# Patient Record
Sex: Female | Born: 1995 | Race: Black or African American | Hispanic: No | Marital: Single | Smoking: Never smoker
Health system: Southern US, Community
[De-identification: ages and names within clinical notes are randomized; demographics above are authoritative.]

## PROBLEM LIST (undated history)

## (undated) ENCOUNTER — Inpatient Hospital Stay (HOSPITAL_COMMUNITY): Payer: BC Managed Care – PPO

## (undated) DIAGNOSIS — M359 Systemic involvement of connective tissue, unspecified: Secondary | ICD-10-CM

## (undated) DIAGNOSIS — E66813 Obesity, class 3: Secondary | ICD-10-CM

## (undated) DIAGNOSIS — E119 Type 2 diabetes mellitus without complications: Secondary | ICD-10-CM

## (undated) DIAGNOSIS — N7093 Salpingitis and oophoritis, unspecified: Secondary | ICD-10-CM

## (undated) DIAGNOSIS — N719 Inflammatory disease of uterus, unspecified: Secondary | ICD-10-CM

## (undated) DIAGNOSIS — G4730 Sleep apnea, unspecified: Secondary | ICD-10-CM

## (undated) DIAGNOSIS — N809 Endometriosis, unspecified: Secondary | ICD-10-CM

## (undated) DIAGNOSIS — J45909 Unspecified asthma, uncomplicated: Secondary | ICD-10-CM

## (undated) DIAGNOSIS — M797 Fibromyalgia: Secondary | ICD-10-CM

## (undated) HISTORY — DX: Type 2 diabetes mellitus without complications: E11.9

## (undated) HISTORY — DX: Salpingitis and oophoritis, unspecified: N70.93

## (undated) HISTORY — PX: CHOLECYSTECTOMY: SHX55

## (undated) HISTORY — DX: Obesity, class 3: E66.813

## (undated) HISTORY — DX: Inflammatory disease of uterus, unspecified: N71.9

## (undated) HISTORY — PX: TONSILLECTOMY AND ADENOIDECTOMY: SHX28

## (undated) HISTORY — DX: Morbid (severe) obesity due to excess calories: E66.01

## (undated) HISTORY — PX: DILATATION AND CURETTAGE/HYSTEROSCOPY WITH MINERVA: SHX6851

## (undated) HISTORY — PX: TONSILLECTOMY: SUR1361

---
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
## 2017-07-30 ENCOUNTER — Ambulatory Visit
Admission: EM | Admit: 2017-07-30 | Discharge: 2017-07-30 | Disposition: A | Discharge status: Home / Self Care | Payer: Medicaid Other | Attending: Family Medicine | Admitting: Family Medicine

## 2017-07-30 ENCOUNTER — Encounter: Payer: Self-pay | Admitting: *Deleted

## 2017-07-30 DIAGNOSIS — Z833 Family history of diabetes mellitus: Secondary | ICD-10-CM | POA: Insufficient documentation

## 2017-07-30 DIAGNOSIS — Z8249 Family history of ischemic heart disease and other diseases of the circulatory system: Secondary | ICD-10-CM | POA: Diagnosis not present

## 2017-07-30 DIAGNOSIS — N938 Other specified abnormal uterine and vaginal bleeding: Secondary | ICD-10-CM

## 2017-07-30 DIAGNOSIS — R1032 Left lower quadrant pain: Secondary | ICD-10-CM | POA: Diagnosis not present

## 2017-07-30 DIAGNOSIS — R1031 Right lower quadrant pain: Secondary | ICD-10-CM | POA: Diagnosis not present

## 2017-07-30 DIAGNOSIS — R102 Pelvic and perineal pain: Secondary | ICD-10-CM | POA: Insufficient documentation

## 2017-07-30 LAB — URINALYSIS, COMPLETE (UACMP) WITH MICROSCOPIC
Bilirubin Urine: NEGATIVE
Glucose, UA: NEGATIVE mg/dL
Ketones, ur: NEGATIVE mg/dL
Leukocytes, UA: NEGATIVE
Nitrite: NEGATIVE
Protein, ur: NEGATIVE mg/dL
Specific Gravity, Urine: 1.025 (ref 1.005–1.030)
pH: 6.5 (ref 5.0–8.0)

## 2017-07-30 LAB — PREGNANCY, URINE: Preg Test, Ur: NEGATIVE

## 2017-07-30 MED ORDER — KETOROLAC TROMETHAMINE 10 MG PO TABS: 10.0000 mg | ORAL_TABLET | Freq: Three times a day (TID) | ORAL | 0 refills | Status: DC | PRN

## 2017-07-30 MED ORDER — ONDANSETRON 8 MG PO TBDP
8.0000 mg | ORAL_TABLET | Freq: Once | ORAL | Status: AC
  Administered 2017-07-30: 8 mg via ORAL

## 2017-07-30 MED ORDER — KETOROLAC TROMETHAMINE 60 MG/2ML IM SOLN
60.0000 mg | Freq: Once | INTRAMUSCULAR | Status: AC
  Administered 2017-07-30: 60 mg via INTRAMUSCULAR

## 2017-07-30 NOTE — ED Triage Notes (Signed)
Patient started having lower abdominal pain 2 weeks ago that started hurting worse 3 days ago. Patient was checked for UTI 2 weeks ago.

## 2017-07-30 NOTE — Discharge Instructions (Signed)
Follow-up with Gynecologist

## 2017-07-30 NOTE — ED Provider Notes (Signed)
[facility]    CSN: [id number] Arrival date & time: 07/30/17  1510     History   Chief Complaint Chief Complaint  Patient presents with  . Abdominal Pain    HPI Jade Lopez is a 21 y.o. female.   The history is provided by the patient.  Abdominal Pain  Pain location:  RLQ and LLQ Pain quality: cramping   Pain radiates to:  Does not radiate Pain severity:  Moderate Onset quality:  Gradual Duration:  10 days Timing:  Constant Progression:  Worsening Chronicity:  Recurrent Context: not alcohol use, not awakening from sleep, not diet changes, not eating, not laxative use, not medication withdrawal, not previous surgeries, not recent illness, not recent sexual activity, not recent travel, not retching, not sick contacts, not suspicious food intake and not trauma   Relieved by:  None tried Ineffective treatments:  None tried Associated symptoms: vaginal bleeding   Associated symptoms: no anorexia, no belching, no chest pain, no chills, no constipation, no cough, no diarrhea, no dysuria, no fatigue, no fever, no flatus, no hematemesis, no hematochezia, no hematuria, no melena, no nausea, no shortness of breath, no sore throat, no vaginal discharge and no vomiting   Risk factors: obesity   Risk factors: no alcohol abuse, no aspirin use, not elderly, has not had multiple surgeries, no NSAID use, not pregnant and no recent hospitalization   Risk factors comment:  History of fibroids   History reviewed. No pertinent past medical history.  There are no active problems to display for this patient.   Past Surgical History:  Procedure Laterality Date  . TONSILLECTOMY AND ADENOIDECTOMY      OB History    No data available       Home Medications    Prior to Admission medications   Medication Sig Start Date End Date Taking? Authorizing Provider  ketorolac (TORADOL) 10 MG tablet Take 1 tablet (10 mg total) by mouth every 8 (eight) hours as needed. 07/30/17    Payton Mccallum, MD    Family History Family History  Problem Relation Age of Onset  . Diabetes Mother   . Hypertension Mother   . Diabetes Father   . Hypertension Father     Social History Social History  Substance Use Topics  . Smoking status: Never Smoker  . Smokeless tobacco: Never Used  . Alcohol use No     Allergies   Other   Review of Systems Review of Systems  Constitutional: Negative for chills, fatigue and fever.  HENT: Negative for sore throat.   Respiratory: Negative for cough and shortness of breath.   Cardiovascular: Negative for chest pain.  Gastrointestinal: Positive for abdominal pain. Negative for anorexia, constipation, diarrhea, flatus, hematemesis, hematochezia, melena, nausea and vomiting.  Genitourinary: Positive for vaginal bleeding. Negative for dysuria, hematuria and vaginal discharge.     Physical Exam Triage Vital Signs ED Triage Vitals  Enc Vitals Group     BP 07/30/17 1543 109/81     Pulse Rate 07/30/17 1543 (!) 117     Resp 07/30/17 1543 16     Temp 07/30/17 1543 98.8 F (37.1 C)     Temp Source 07/30/17 1543 Oral     SpO2 07/30/17 1543 100 %     Weight 07/30/17 1544 (!) 365 lb (165.6 kg)     Height 07/30/17 1544  (1.626 m)     Head Circumference --      Peak Flow --  Pain Score 07/30/17 1544 9     Pain Loc --      Pain Edu? --      Excl. in GC? --    No data found.   Updated Vital Signs BP 109/81 (BP Location: Left Arm)   Pulse (!) 117   Temp 98.8 F (37.1 C) (Oral)   Resp 16   Ht  (1.626 m)   Wt (!) 365 lb (165.6 kg)   LMP 07/30/2017   SpO2 100%   BMI 62.65 kg/m   Visual Acuity Right Eye Distance:   Left Eye Distance:   Bilateral Distance:    Right Eye Near:   Left Eye Near:    Bilateral Near:     Physical Exam  Constitutional: She appears well-developed and well-nourished. No distress.  Abdominal: Soft. Bowel sounds are normal. She exhibits no distension and no mass. There is tenderness  (mild, lower bilateral). There is no rebound and no guarding.  Skin: She is not diaphoretic.  Nursing note and vitals reviewed.    UC Treatments / Results  Labs (all labs ordered are listed, but only abnormal results are displayed) Labs Reviewed  URINALYSIS, COMPLETE (UACMP) WITH MICROSCOPIC - Abnormal; Notable for the following:       Result Value   APPearance CLOUDY (*)    Hgb urine dipstick MODERATE (*)    Squamous Epithelial / LPF 6-30 (*)    Bacteria, UA FEW (*)    All other components within normal limits  URINE CULTURE  PREGNANCY, URINE    EKG  EKG Interpretation None       Radiology No results found.  Procedures Procedures (including critical care time)  Medications Ordered in UC Medications  ondansetron (ZOFRAN-ODT) disintegrating tablet 8 mg (8 mg Oral Given 07/30/17 1705)  ketorolac (TORADOL) injection 60 mg (60 mg Intramuscular Given 07/30/17 1706)     Initial Impression / Assessment and Plan / UC Course  I have reviewed the triage vital signs and the nursing notes.  Pertinent labs & imaging results that were available during my care of the patient were reviewed by me and considered in my medical decision making (see chart for details).       Final Clinical Impressions(s) / UC Diagnoses   Final diagnoses:  Pelvic cramping    New Prescriptions Discharge Medication List as of 07/30/2017  5:19 PM    START taking these medications   Details  ketorolac (TORADOL) 10 MG tablet Take 1 tablet (10 mg total) by mouth every 8 (eight) hours as needed., Starting Fri 07/30/2017, Normal       1. diagnosis reviewed with patient; patient given toradol  IM x1 and zofran  odt with improvement of symptoms 2. rx as per orders above; reviewed possible side effects, interactions, risks and benefits  3.Follow-up with GYN 4. Follow up prn if symptoms worsen or don't improve  Controlled Substance Prescriptions [place] Controlled Substance Registry consulted? Not  Applicable   Payton Mccallum, MD 07/30/17 2057

## 2017-08-01 LAB — URINE CULTURE: Special Requests: NORMAL

## 2018-11-09 DIAGNOSIS — I669 Occlusion and stenosis of unspecified cerebral artery: Secondary | ICD-10-CM

## 2018-11-09 HISTORY — DX: Occlusion and stenosis of unspecified cerebral artery: I66.9

## 2019-11-10 HISTORY — PX: INCISION AND DRAINAGE ABSCESS: SHX5864

## 2020-10-06 ENCOUNTER — Emergency Department (HOSPITAL_COMMUNITY): Payer: BC Managed Care – PPO

## 2020-10-06 ENCOUNTER — Encounter (HOSPITAL_COMMUNITY): Payer: Self-pay

## 2020-10-06 ENCOUNTER — Emergency Department (HOSPITAL_COMMUNITY)
Admission: EM | Admit: 2020-10-06 | Discharge: 2020-10-06 | Disposition: A | Discharge status: Home / Self Care | Payer: BC Managed Care – PPO | Attending: Emergency Medicine | Admitting: Emergency Medicine

## 2020-10-06 ENCOUNTER — Other Ambulatory Visit: Payer: Self-pay

## 2020-10-06 ENCOUNTER — Encounter (HOSPITAL_COMMUNITY): Payer: Self-pay | Admitting: Emergency Medicine

## 2020-10-06 ENCOUNTER — Ambulatory Visit (HOSPITAL_COMMUNITY)
Admission: EM | Admit: 2020-10-06 | Discharge: 2020-10-06 | Disposition: A | Discharge status: Nursing Home (Includes ICF) | Payer: BC Managed Care – PPO

## 2020-10-06 DIAGNOSIS — R0602 Shortness of breath: Secondary | ICD-10-CM | POA: Diagnosis not present

## 2020-10-06 DIAGNOSIS — R42 Dizziness and giddiness: Secondary | ICD-10-CM | POA: Insufficient documentation

## 2020-10-06 DIAGNOSIS — R0600 Dyspnea, unspecified: Secondary | ICD-10-CM | POA: Insufficient documentation

## 2020-10-06 DIAGNOSIS — R002 Palpitations: Secondary | ICD-10-CM | POA: Insufficient documentation

## 2020-10-06 DIAGNOSIS — R000 Tachycardia, unspecified: Secondary | ICD-10-CM

## 2020-10-06 DIAGNOSIS — Z20822 Contact with and (suspected) exposure to covid-19: Secondary | ICD-10-CM | POA: Diagnosis not present

## 2020-10-06 HISTORY — DX: Fibromyalgia: M79.7

## 2020-10-06 HISTORY — DX: Systemic involvement of connective tissue, unspecified: M35.9

## 2020-10-06 LAB — I-STAT BETA HCG BLOOD, ED (MC, WL, AP ONLY): I-stat hCG, quantitative: <5 m[IU]/mL (ref <5)

## 2020-10-06 LAB — COMPREHENSIVE METABOLIC PANEL
ALT: 14 U/L (ref 0–44)
AST: 14 U/L — ABNORMAL LOW (ref 15–41)
Albumin: 3.3 g/dL — ABNORMAL LOW (ref 3.5–5.0)
Alkaline Phosphatase: 79 U/L (ref 38–126)
Anion gap: 11 (ref 5–15)
BUN: 11 mg/dL (ref 6–20)
CO2: 23 mmol/L (ref 22–32)
Calcium: 9.3 mg/dL (ref 8.9–10.3)
Chloride: 105 mmol/L (ref 98–111)
Creatinine, Ser: 0.76 mg/dL (ref 0.44–1.00)
GFR, Estimated: >60 mL/min (ref >60)
Glucose, Bld: 97 mg/dL (ref 70–99)
Potassium: 4.1 mmol/L (ref 3.5–5.1)
Sodium: 139 mmol/L (ref 135–145)
Total Bilirubin: 0.6 mg/dL (ref 0.3–1.2)
Total Protein: 7.7 g/dL (ref 6.5–8.1)

## 2020-10-06 LAB — RESP PANEL BY RT-PCR (FLU A&B, COVID) ARPGX2
Influenza A by PCR: NEGATIVE
Influenza B by PCR: NEGATIVE
SARS Coronavirus 2 by RT PCR: NEGATIVE

## 2020-10-06 LAB — CBC WITH DIFFERENTIAL/PLATELET
Abs Immature Granulocytes: 0.04 10*3/uL (ref 0.00–0.07)
Basophils Absolute: 0.1 10*3/uL (ref 0.0–0.1)
Basophils Relative: 1 %
Eosinophils Absolute: 0.2 10*3/uL (ref 0.0–0.5)
Eosinophils Relative: 2 %
HCT: 45.4 % (ref 36.0–46.0)
Hemoglobin: 13.5 g/dL (ref 12.0–15.0)
Immature Granulocytes: 0 %
Lymphocytes Relative: 28 %
Lymphs Abs: 2.8 10*3/uL (ref 0.7–4.0)
MCH: 24.9 pg — ABNORMAL LOW (ref 26.0–34.0)
MCHC: 29.7 g/dL — ABNORMAL LOW (ref 30.0–36.0)
MCV: 83.8 fL (ref 80.0–100.0)
Monocytes Absolute: 0.6 10*3/uL (ref 0.1–1.0)
Monocytes Relative: 6 %
Neutro Abs: 6.3 10*3/uL (ref 1.7–7.7)
Neutrophils Relative %: 63 %
Platelets: 299 10*3/uL (ref 150–400)
RBC: 5.42 MIL/uL — ABNORMAL HIGH (ref 3.87–5.11)
RDW: 14.6 % (ref 11.5–15.5)
WBC: 10 10*3/uL (ref 4.0–10.5)
nRBC: 0 % (ref 0.0–0.2)

## 2020-10-06 MED ORDER — IOHEXOL 350 MG/ML SOLN
75.0000 mL | Freq: Once | INTRAVENOUS | Status: AC | PRN
  Administered 2020-10-06: 75 mL via INTRAVENOUS

## 2020-10-06 NOTE — ED Notes (Signed)
[facility] notified of need for pt transport. 

## 2020-10-06 NOTE — ED Notes (Signed)
Pt returned from ct scan. Pt c/o headache, asked for ice pack for same. Ice pack applied.

## 2020-10-06 NOTE — ED Provider Notes (Signed)
[facility]    CSN: [id number] Arrival date & time: 10/06/20  1406      History   Chief Complaint Chief Complaint  Patient presents with  . Shortness of Breath    HPI Jade Lopez is a 24 y.o. female.   HPI Patient presents today with over a week of worsening shortness of breath and right anterior sharp stabbing chest pain.  Patient reports that she was diagnosed with pneumonia and a pleural effusion at a hospital in [facility] approximately 7 weeks ago.  She reports that she has had shortness of breath since that time however over the last week her shortness of breath and stabbing chest pain have worsened.  Since being here in urgent care she reports increasingly feeling dizzy she is tachycardic and is having increased work of breathing.  Her sats dropped with ambulation into the low 90s and rebounded to 92% after sitting.  Patient has a medical history significant for systemic lupus erythematous.  Past Medical History:  Diagnosis Date  . Autoimmune disease (HCC)   . Fibromyalgia     There are no problems to display for this patient.   Past Surgical History:  Procedure Laterality Date  . CHOLECYSTECTOMY    . TONSILLECTOMY    . TONSILLECTOMY AND ADENOIDECTOMY      OB History   No obstetric history on file.      Home Medications    Prior to Admission medications   Medication Sig Start Date End Date Taking? Authorizing Provider  albuterol (ACCUNEB) 1.25 MG/3ML nebulizer solution Take 1 ampule by nebulization every 6 (six) hours as needed for wheezing.   Yes [provider]  albuterol (VENTOLIN HFA) 108 (90 Base) MCG/ACT inhaler Inhale into the lungs every 6 (six) hours as needed for wheezing or shortness of breath.   Yes [provider]  predniSONE (DELTASONE) 20 MG tablet Take 20 mg by mouth 2 (two) times daily with a meal.   Yes [provider]  ketorolac (TORADOL) 10 MG tablet Take 1 tablet (10 mg total) by mouth  every 8 (eight) hours as needed. 07/30/17   Payton Mccallum, MD    Family History Family History  Problem Relation Age of Onset  . Diabetes Mother   . Hypertension Mother   . Diabetes Father   . Hypertension Father     Social History Social History   Tobacco Use  . Smoking status: Never Smoker  . Smokeless tobacco: Never Used  Substance Use Topics  . Alcohol use: Yes  . Drug use: No     Allergies   Other and Clindamycin/lincomycin   Review of Systems Review of Systems Pertinent negatives listed in HPI  Physical Exam Triage Vital Signs ED Triage Vitals  Enc Vitals Group     BP 10/06/20 1458 123/86     Pulse Rate 10/06/20 1458 (!) 115     Resp 10/06/20 1458 (!) 22     Temp 10/06/20 1458 98.3 F (36.8 C)     Temp Source 10/06/20 1458 Oral     SpO2 10/06/20 1458 100 %     Weight --      Height --      Head Circumference --      Peak Flow --      Pain Score 10/06/20 1450 9     Pain Loc --      Pain Edu? --      Excl. in GC? --    No  data found.  Updated Vital Signs BP 123/86 (BP Location: Right Arm) Comment (BP Location): regular cuff, forearm  Pulse (!) 130   Temp 98.3 F (36.8 C) (Oral)   Resp (!) 22   SpO2 92%   Visual Acuity Right Eye Distance:   Left Eye Distance:   Bilateral Distance:    Right Eye Near:   Left Eye Near:    Bilateral Near:     Physical Exam Constitutional:      General: She is in acute distress.     Appearance: She is obese.  Eyes:     Pupils: Pupils are equal, round, and reactive to light.  Cardiovascular:     Rate and Rhythm: Tachycardia present.  Chest:     Chest wall: Tenderness present.  Musculoskeletal:     Cervical back: Normal range of motion and neck supple.  Skin:    General: Skin is warm and dry.     Capillary Refill: Capillary refill takes less than 2 seconds.  Neurological:     General: No focal deficit present.  Psychiatric:        Mood and Affect: Mood normal.      UC Treatments / Results    Labs (all labs ordered are listed, but only abnormal results are displayed) Labs Reviewed - No data to display  EKG   Radiology No results found.  Procedures Procedures (including critical care time)  Medications Ordered in UC Medications - No data to display  Initial Impression / Assessment and Plan / UC Course  I have reviewed the triage vital signs and the nursing notes.  Pertinent labs & imaging results that were available during my care of the patient were reviewed by me and considered in my medical decision making (see chart for details).     Patient diagnosed with pneumonia at [facility] over 7 weeks ago and reports she was also told that she has a right effusion.  She reports gradually worsening shortness of breath over the last week and she is tachycardic and has had sats as low as 92% with ambulation since being here.  Suspect patient has a PE given underlying chronic condition of SLE and recent diagnosis of effusion.   Final Clinical Impressions(s) / UC Diagnoses   Final diagnoses:  SOB (shortness of breath)  Tachycardia     Discharge Instructions     Suspect patient has a PE given recent diagnosis of pneumonia 7 weeks ago and a diagnosis of a pleural effusion about 4 weeks ago per patient and lupus disease. Concern for possible pulmonary embolism. Go to [facility], ER for further work-up and evaluation.     ED Prescriptions    None     PDMP not reviewed this encounter.   Bing Neighbors, FNP 10/06/20 1623

## 2020-10-06 NOTE — Discharge Instructions (Addendum)
Please follow-up with your primary care provider regarding today's encounter.  I would also like you to get established with center at your [facility] in [place], [place].  You need to be having ongoing evaluation and management for your acute on chronic dyspnea (shortness of breath).  I have referred you to Dr. Algie Coffer, cardiologist here in [place], [place].  I believe that you would benefit from cardiac work-up given your reported symptoms.  However, if this is too far of a drive, I highly encourage you to get established with a cardiologist at a location that is more convenient.  The CT of your chest was mildly limited due to artifact, however the visualized portions appear clear.  While your work-up today was largely reassuring, please return to the ED or seek immediate medical attention should you develop any new or worsening symptoms.

## 2020-10-06 NOTE — Discharge Instructions (Addendum)
Suspect patient has a PE given recent diagnosis of pneumonia 7 weeks ago and a diagnosis of a pleural effusion about 4 weeks ago per patient and lupus disease. Concern for possible pulmonary embolism. Go to [facility], ER for further work-up and evaluation.

## 2020-10-06 NOTE — ED Notes (Signed)
Several attempts made to contact ED Charge RN without success.

## 2020-10-06 NOTE — ED Notes (Addendum)
SPO2 remained 97-98% while ambulating on room air. Patient became dizzy with ambulation. HR went up to 120s-130s. Patient back in bed sinus tach at 108 noted to monitor, SpO2 98%. Green, PA made aware of same via message.

## 2020-10-06 NOTE — ED Notes (Signed)
Patient is being discharged from the Urgent Care Center and sent to the Emergency Department via [facility]. Per K. Harris, NP, patient is stable but in need of higher level of care due to probable PE. Patient is aware and verbalizes understanding of plan of care.  Vitals:   10/06/20 1458 10/06/20 1505  BP: 123/86   Pulse: (!) 115 (!) 130  Resp: (!) 22   Temp: 98.3 F (36.8 C)   SpO2: 100% 92%

## 2020-10-06 NOTE — ED Provider Notes (Signed)
[facility] EMERGENCY DEPARTMENT Provider Note   CSN: [id number] Arrival date & time: 10/06/20  1558     History Chief Complaint  Patient presents with  . Shortness of Breath    Jade Lopez is a 24 y.o. female with PMH of autoimmune disease who presents to the ED via EMS sent from urgent care for right-sided anterior stabbing chest pain and shortness of breath.  She reported increasing dizziness, palpitations, and work of breathing since her recent diagnosis of pneumonia and pleural effusion 7 weeks ago in [place], [place].  The urgent care provider told patient that she was concerned for pulmonary embolism and encouraged her to come to the ED for CTA of the chest.  On my examination, patient reports that she is from [facility] where she sees an endocrinologist.  She states that she is currently being worked up for autoimmune disease, but her diagnosis is not yet clear.  She denies any other past medical history.  She states that this year she was diagnosed with pneumonia and earlier this year she also had mild pleural effusion.  She was treated with a Z-Pak and provided an albuterol inhaler.  In the past week, she developed sudden onset anterior sharp chest pains that are worse with deep inspiration.  She states that her chest pain is intermittent.  She also states that she is feeling lightheaded with exertion and feels as though she might pass out.  She is feeling much more short of breath than her baseline.  She denies any fevers or chills, cough, congestion or sore throat, headache, constant chest pain, abdominal pain, nausea or vomiting, hemoptysis, unilateral extremity swelling or edema, history of clots or clotting disorder, or other symptoms.  She states that the person who evaluated her at [facility] was concerned for blood blot.  She is here in [place], [place] for Thanksgiving visiting her sister.  She has not been immunized for COVID-19.  HPI     Past Medical  History:  Diagnosis Date  . Autoimmune disease (HCC)   . Fibromyalgia     There are no problems to display for this patient.   Past Surgical History:  Procedure Laterality Date  . CHOLECYSTECTOMY    . TONSILLECTOMY    . TONSILLECTOMY AND ADENOIDECTOMY       OB History   No obstetric history on file.     Family History  Problem Relation Age of Onset  . Diabetes Mother   . Hypertension Mother   . Diabetes Father   . Hypertension Father     Social History   Tobacco Use  . Smoking status: Never Smoker  . Smokeless tobacco: Never Used  Substance Use Topics  . Alcohol use: Yes  . Drug use: No    Home Medications Prior to Admission medications   Medication Sig Start Date End Date Taking? Authorizing Provider  albuterol (ACCUNEB) 1.25 MG/3ML nebulizer solution Take 1 ampule by nebulization every 6 (six) hours as needed for wheezing.    [provider]  albuterol (VENTOLIN HFA) 108 (90 Base) MCG/ACT inhaler Inhale into the lungs every 6 (six) hours as needed for wheezing or shortness of breath.    [provider]  ketorolac (TORADOL) 10 MG tablet Take 1 tablet (10 mg total) by mouth every 8 (eight) hours as needed. 07/30/17   Payton Mccallum, MD  predniSONE (DELTASONE) 20 MG tablet Take 20 mg by mouth 2 (two) times daily with a meal.    [provider]    Allergies    Other and Clindamycin/lincomycin  Review of Systems   Review of Systems  All other systems reviewed and are negative.   Physical Exam Updated Vital Signs BP 123/86   Pulse (!) 102   Temp 98.3 F (36.8 C) (Oral)   Resp 18   Ht 5\' 4"  (1.626 m)   Wt (!) 170.1 kg   SpO2 100%   BMI 64.37 kg/m   Physical Exam Constitutional:      General: She is not in acute distress.    Appearance: She is obese. She is not toxic-appearing.  Cardiovascular:     Rate and Rhythm: Normal rate and regular rhythm.     Pulses: Normal pulses.     Heart sounds: Normal heart sounds.    Pulmonary:     Comments: Mildly increased work of breathing.  Tachypneic in 20s.  No obvious rales, wheezing, or other abnormal breath sounds.  CTA bilaterally.  No reproducible chest wall tenderness. Abdominal:     General: Abdomen is flat. There is no distension.     Palpations: Abdomen is soft.     Tenderness: There is no guarding.     Comments: Mild left-sided tenderness.  Soft, nondistended.  Musculoskeletal:     Cervical back: Normal range of motion.     Right lower leg: No edema.     Left lower leg: No edema.  Skin:    Capillary Refill: Capillary refill takes less than 2 seconds.  Neurological:     General: No focal deficit present.     Mental Status: She is oriented to person, place, and time.     ED Results / Procedures / Treatments   Labs (all labs ordered are listed, but only abnormal results are displayed) Labs Reviewed  CBC WITH DIFFERENTIAL/PLATELET - Abnormal; Notable for the following components:      Result Value   RBC 5.42 (*)    MCH 24.9 (*)    MCHC 29.7 (*)    All other components within normal limits  COMPREHENSIVE METABOLIC PANEL - Abnormal; Notable for the following components:   Albumin 3.3 (*)    AST 14 (*)    All other components within normal limits  RESP PANEL BY RT-PCR (FLU A&B, COVID) ARPGX2  I-STAT BETA HCG BLOOD, ED ([facility], [facility], [facility] ONLY)    EKG EKG Interpretation  Date/Time:  Sunday October 06 2020 16:06:19 EST Ventricular Rate:  109 PR Interval:    QRS Duration: 81 QT Interval:  323 QTC Calculation: 435 R Axis:   38 Text Interpretation: Sinus tachycardia No old tracing to compare Confirmed by [id number] [phone]) on 10/06/2020 4:51:35 PM   Radiology CT Angio Chest PE W and/or Wo Contrast  Result Date: 10/06/2020 CLINICAL DATA:  Pneumonia diagnosed 7 weeks ago, now with chest pain. EXAM: CT ANGIOGRAPHY CHEST WITH CONTRAST TECHNIQUE: Multidetector CT imaging of the chest was performed using the standard protocol during bolus  administration of intravenous contrast. Multiplanar CT image reconstructions and MIPs were obtained to evaluate the vascular anatomy. CONTRAST:  55mL OMNIPAQUE IOHEXOL 350 MG/ML SOLN COMPARISON:  None. FINDINGS: Cardiovascular: Satisfactory opacification of the pulmonary arteries to the segmental level. Quantum mottle artifact limits the evaluation of segmental and subsegmental branches for pulmonary embolism. Given this limitation, no evidence of pulmonary embolism. Normal heart size. No pericardial effusion. Mediastinum/Nodes: No enlarged mediastinal, hilar, or axillary lymph nodes. Thyroid gland, trachea, and esophagus demonstrate no significant findings. Lungs/Pleura: Lungs are clear. No pleural  effusion or pneumothorax. Upper Abdomen: No acute abnormality. Musculoskeletal: No chest wall abnormality. No acute or significant osseous findings. Review of the MIP images confirms the above findings. IMPRESSION: 1. Quantum mottle artifact limits the evaluation of segmental and subsegmental branches for pulmonary embolism. Given this limitation, no evidence of pulmonary embolism. 2. No acute pulmonary process. Electronically Signed   By: Romona Curls M.D.   On: 10/06/2020 18:44    Procedures Procedures (including critical care time)  Medications Ordered in ED Medications  iohexol (OMNIPAQUE) 350 MG/ML injection 75 mL (75 mLs Intravenous Contrast Given 10/06/20 1817)    ED Course  I have reviewed the triage vital signs and the nursing notes.  Pertinent labs & imaging results that were available during my care of the patient were reviewed by me and considered in my medical decision making (see chart for details).    MDM Rules/Calculators/A&P                          Patient was sent here by urgent care with concern for pulmonary embolism.  Will obtain CTA of chest given patient's acute worsening of her shortness of breath and her associated chest pain in the context of tachypnea and tachycardia.  She  was noted to be borderline hypoxic at the urgent care, but while resting in the ED she is saturating 100% on room air.  No history of clots or clotting disorder.  We will also obtain EKG and basic laboratory work-up.  Labs CBC with differential: No leukocytosis concerning for infection.  No anemia. CMP: Unremarkable. I-STAT beta-hCG: Less than 5. Respiratory panel by PCR: Negative.  Imaging I personally reviewed CTA of the chest which demonstrates quantum mottle artifact which limits the evaluation of segmental and subsegmental branches for pulmonary embolism.  However, there is no obvious PE due to segmental level.  Lungs are also appearing to be clear without any evidence of effusion, consolidation, or pneumothorax.  Normal heart size and no effusion noted.  EKG is personally reviewed and demonstrates sinus tachycardia, no prior tracings which compare.  I reviewed her prior medical encounters and she is usually between 95 to 115 bpm.  I do not believe that today her tachycardia is anything new or different from her baseline.  I discussed this with her and she agrees.  Given her reported chest discomfort, dyspnea worse with exertion, and intermittent lightheadedness, I believe that she would be an excellent candidate for outpatient cardiology work-up.  She is morbidly obese with a 64.37 kg/m BMI and obesity hypoventilation syndrome may be a contributing factor for her symptoms and mild tachypnea here in the ED.  However, I suspect that she would benefit from cardiology work-up nonetheless.    Will refer her to Dr. Algie Coffer, cardiology.  Patient requests school note as she was supposed to drive back to [place], [place] tonight.  Her O2 saturation has been greater than 98% all evening and she was able to ambulate to the bathroom independently without any significant increased work of breathing.  I discussed patient with Dr. Effie Shy who agrees with assessment and plan.  All of the evaluation and work-up  results were discussed with the patient and any family at bedside.  Patient and/or family were informed that while patient is appropriate for discharge at this time, some medical emergencies may only develop or become detectable after a period of time.  I specifically instructed patient and/or family to return to return to the ED  or seek immediate medical attention for any new or worsening symptoms.  They were provided opportunity to ask any additional questions and have none at this time.  Prior to discharge patient is feeling well, agreeable with plan for discharge home.  They have expressed understanding of verbal discharge instructions as well as return precautions and are agreeable to the plan.    Final Clinical Impression(s) / ED Diagnoses Final diagnoses:  Dyspnea, unspecified type    Rx / DC Orders ED Discharge Orders    None       Lorelee NewGreen, Fable Huisman L, PA-C 10/06/20 1932    Mancel BaleWentz, Elliott, MD 10/07/20 [phone]

## 2020-10-06 NOTE — ED Triage Notes (Signed)
7 weeks ago diagnosed with pneumonia.    6 weeks ago started feeling pain in left chest, epigastric area and left back.  Pain occurs with walking and breathing deeply.

## 2020-10-06 NOTE — ED Triage Notes (Signed)
Pt to 14 from UC via [facility]. Pt presented with shortness of breath and left chest pain. Pt states she has had a pleural effusion for past 9 months, was diagnosed with pneumonia 7 weeks ago. Pt states she started getting sharp pain in left chest and back a week ago. Pt states she noticed swelling on left side two days ago. Pt states any movement/deep breathing increases pain. Pt NAD on arrival.  EMS VS: 136/76 RR=18 02 sat= 96% on room air

## 2021-05-09 DIAGNOSIS — R079 Chest pain, unspecified: Secondary | ICD-10-CM | POA: Diagnosis not present

## 2021-05-14 DIAGNOSIS — L723 Sebaceous cyst: Secondary | ICD-10-CM | POA: Diagnosis not present

## 2021-05-15 IMAGING — CT CT ANGIO CHEST
2 of 5 series · 17 of 46 positions shown · IV contrast (omnipaque)
Comparison: None.

CLINICAL DATA: Pneumonia diagnosed 7 weeks ago, now with chest
pain.

EXAM:
CT ANGIOGRAPHY CHEST WITH CONTRAST
TECHNIQUE: Multidetector CT imaging of the chest was performed using the
standard protocol during bolus administration of intravenous
contrast. Multiplanar CT image reconstructions and MIPs were
obtained to evaluate the vascular anatomy.
CONTRAST:  55mL OMNIPAQUE IOHEXOL 350 MG/ML SOLN

[Series 7: thins · axial · 0.73mm/px · z∈[+1211,+1420]mm · 14 of 329 slices shown]
[im 15/329  lung]
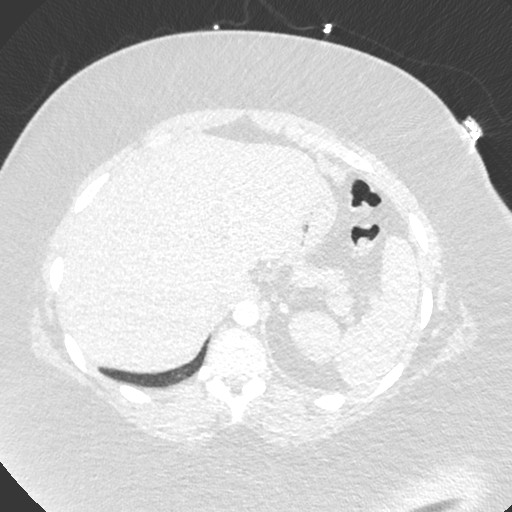
[im 43/329  soft-tissue]
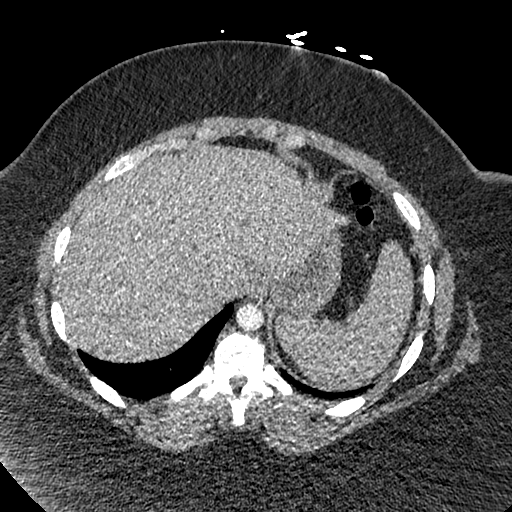
[im 58/329  lung]
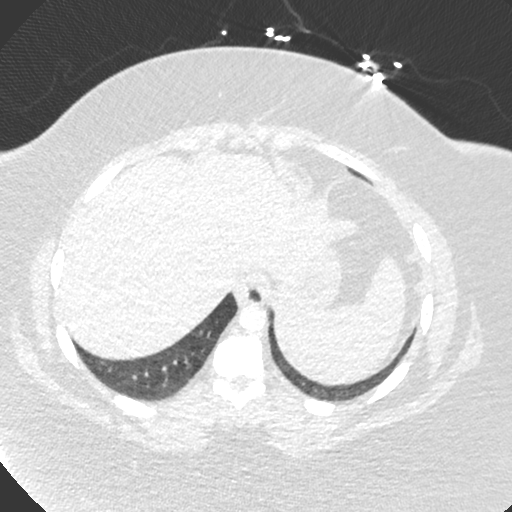
[im 86/329  soft-tissue]
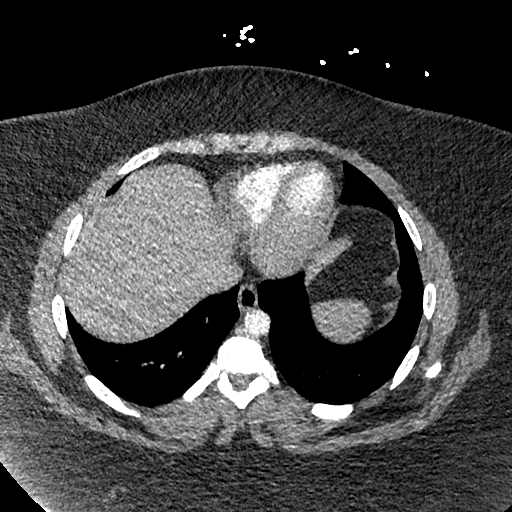
[im 115/329  lung]
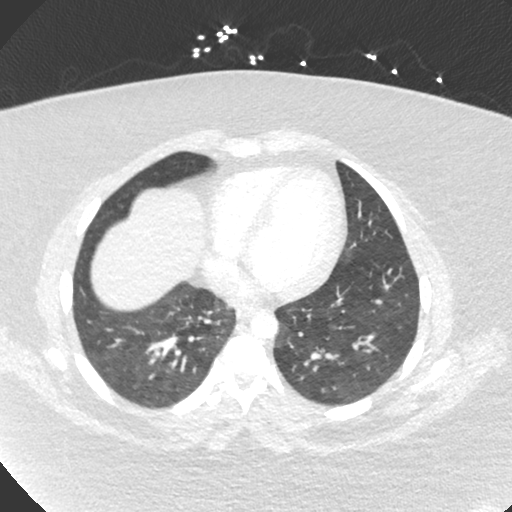
[im 129/329  soft-tissue]
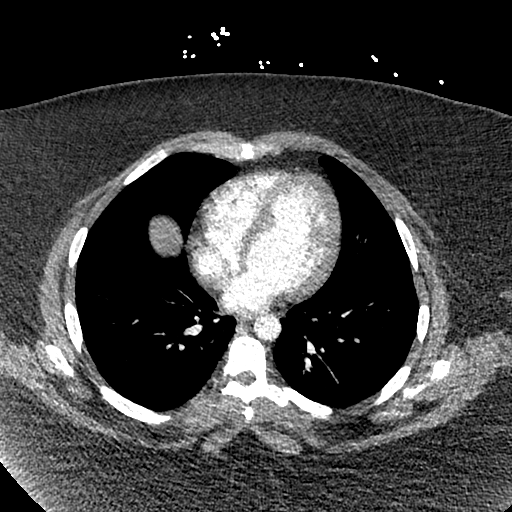
[im 157/329  lung]
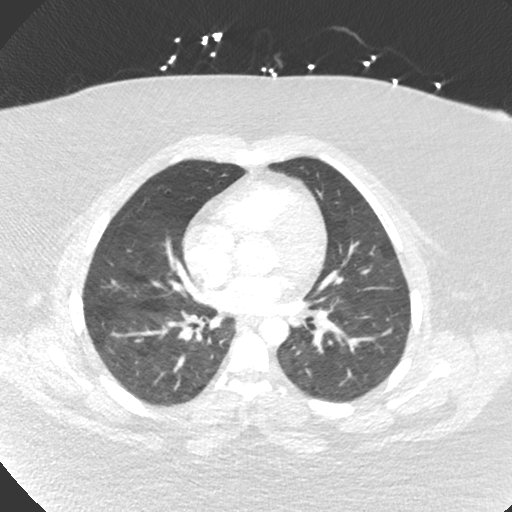
[im 172/329  soft-tissue]
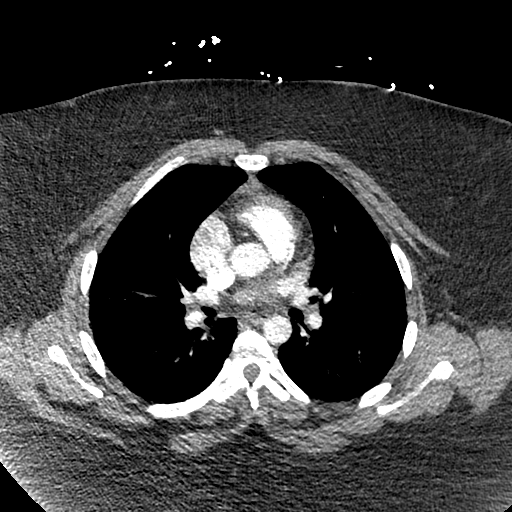
[im 200/329  lung]
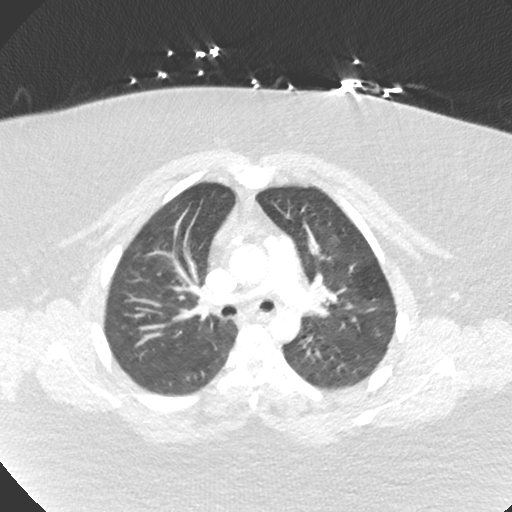
[im 214/329  soft-tissue]
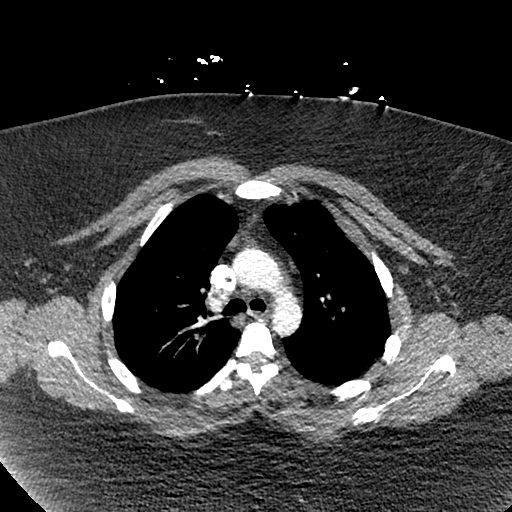
[im 243/329  lung]
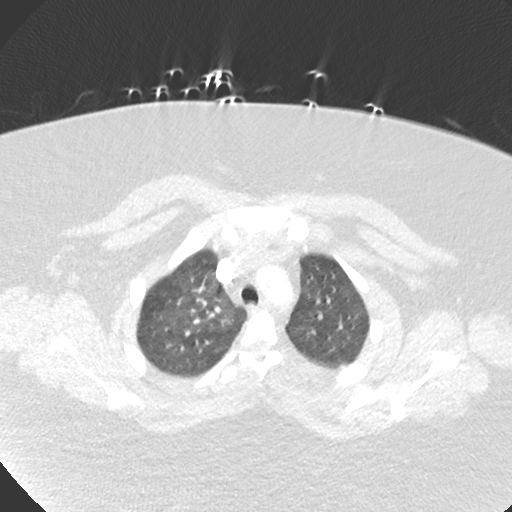
[im 271/329  soft-tissue]
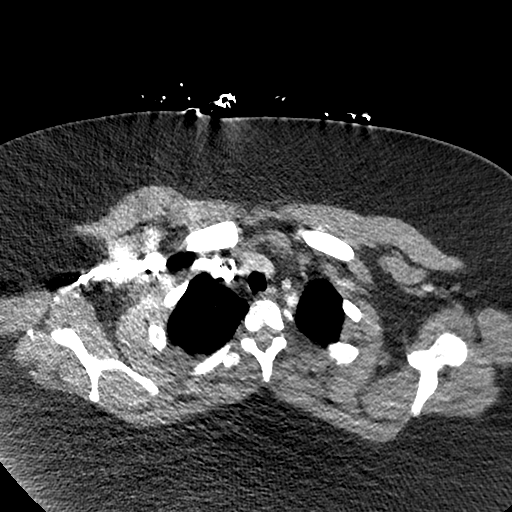
[im 286/329  lung]
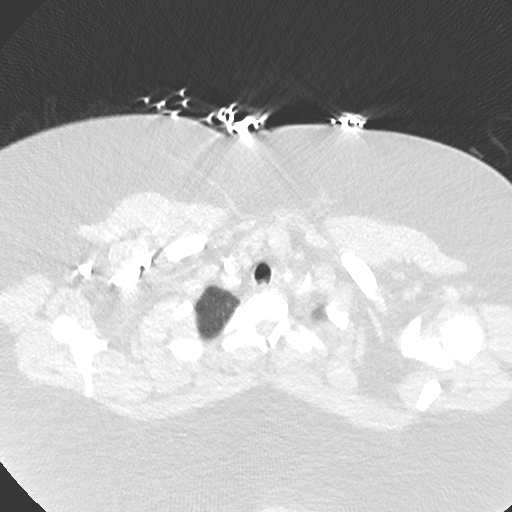
[im 314/329  soft-tissue]
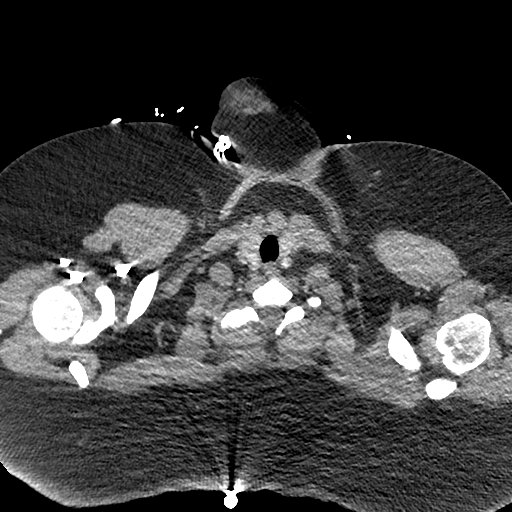

[Series 8: cor · coronal · 0.56mm/px · 3 of 205 slices shown]
[im 52/205  soft-tissue]
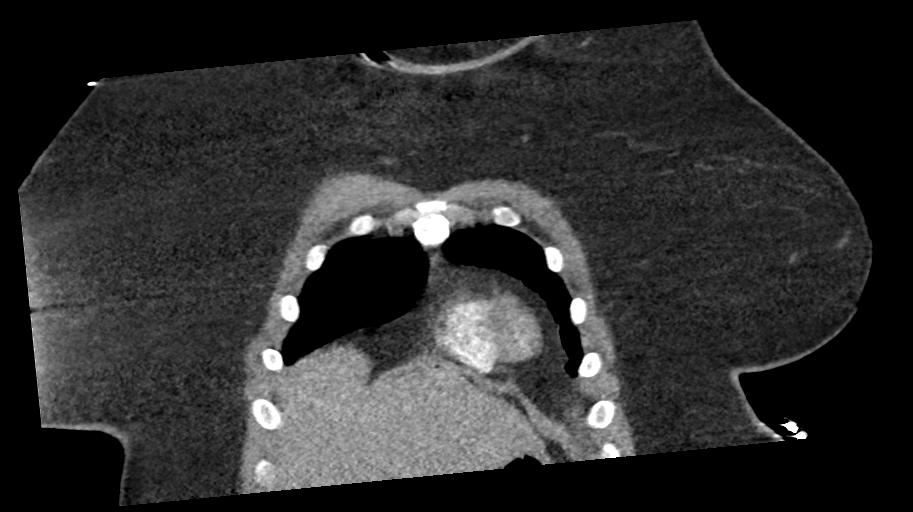
[im 103/205  soft-tissue]
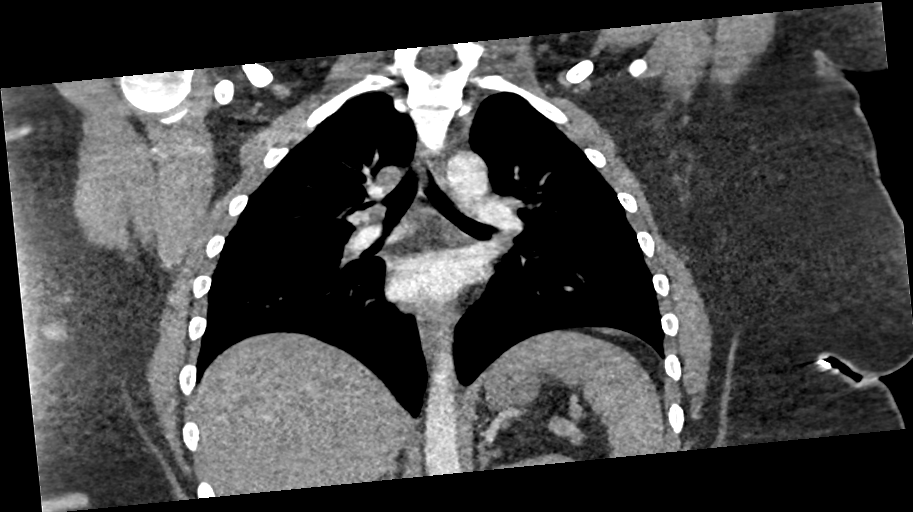
[im 154/205  soft-tissue]
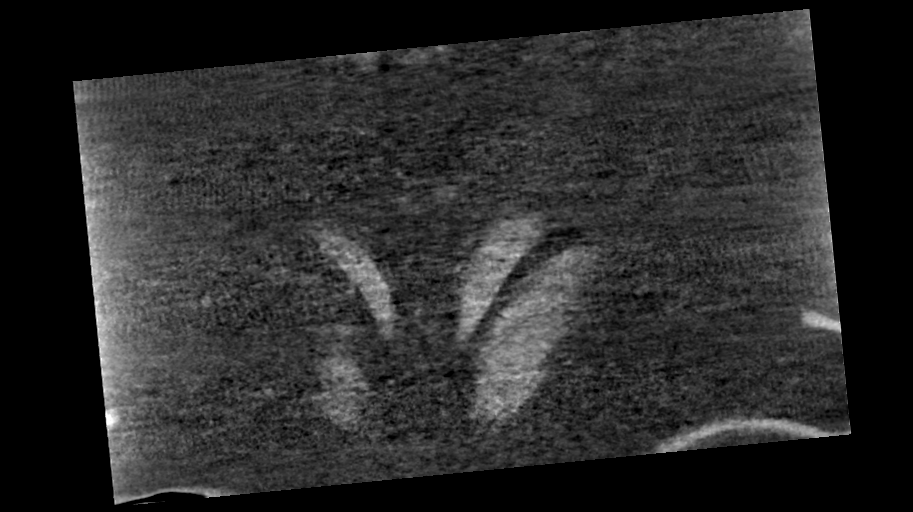

[17 of 46 positions shown; findings below may reference images not displayed]

FINDINGS: Cardiovascular: Satisfactory opacification of the pulmonary arteries
to the segmental level. Quantum mottle artifact limits the
evaluation of segmental and subsegmental branches for pulmonary
embolism. Given this limitation, no evidence of pulmonary embolism.
Normal heart size. No pericardial effusion.

Mediastinum/Nodes: No enlarged mediastinal, hilar, or axillary lymph
nodes. Thyroid gland, trachea, and esophagus demonstrate no
significant findings.

Lungs/Pleura: Lungs are clear. No pleural effusion or pneumothorax.

Upper Abdomen: No acute abnormality.

Musculoskeletal: No chest wall abnormality. No acute or significant
osseous findings.

Review of the MIP images confirms the above findings.
IMPRESSION: 1. Quantum mottle artifact limits the evaluation of segmental and
subsegmental branches for pulmonary embolism. Given this limitation,
no evidence of pulmonary embolism.
2. No acute pulmonary process.

## 2021-05-18 DIAGNOSIS — I472 Ventricular tachycardia: Secondary | ICD-10-CM | POA: Diagnosis not present

## 2021-05-18 DIAGNOSIS — I491 Atrial premature depolarization: Secondary | ICD-10-CM | POA: Diagnosis not present

## 2021-05-18 DIAGNOSIS — I493 Ventricular premature depolarization: Secondary | ICD-10-CM | POA: Diagnosis not present

## 2021-05-28 DIAGNOSIS — L02212 Cutaneous abscess of back [any part, except buttock]: Secondary | ICD-10-CM | POA: Diagnosis not present

## 2021-06-02 DIAGNOSIS — N840 Polyp of corpus uteri: Secondary | ICD-10-CM | POA: Diagnosis not present

## 2021-06-23 DIAGNOSIS — M25519 Pain in unspecified shoulder: Secondary | ICD-10-CM | POA: Diagnosis not present

## 2021-06-23 DIAGNOSIS — R200 Anesthesia of skin: Secondary | ICD-10-CM | POA: Diagnosis not present

## 2021-06-23 DIAGNOSIS — M549 Dorsalgia, unspecified: Secondary | ICD-10-CM | POA: Diagnosis not present

## 2021-06-23 DIAGNOSIS — R0602 Shortness of breath: Secondary | ICD-10-CM | POA: Diagnosis not present

## 2021-06-23 DIAGNOSIS — R079 Chest pain, unspecified: Secondary | ICD-10-CM | POA: Diagnosis not present

## 2021-06-23 DIAGNOSIS — R0789 Other chest pain: Secondary | ICD-10-CM | POA: Diagnosis not present

## 2021-06-23 DIAGNOSIS — E119 Type 2 diabetes mellitus without complications: Secondary | ICD-10-CM | POA: Diagnosis not present

## 2021-06-30 DIAGNOSIS — M25562 Pain in left knee: Secondary | ICD-10-CM | POA: Diagnosis not present

## 2021-07-03 DIAGNOSIS — M25561 Pain in right knee: Secondary | ICD-10-CM | POA: Diagnosis not present

## 2021-07-03 DIAGNOSIS — M25562 Pain in left knee: Secondary | ICD-10-CM | POA: Diagnosis not present

## 2021-07-15 DIAGNOSIS — M7989 Other specified soft tissue disorders: Secondary | ICD-10-CM | POA: Diagnosis not present

## 2021-07-15 DIAGNOSIS — M79605 Pain in left leg: Secondary | ICD-10-CM | POA: Diagnosis not present

## 2021-07-17 DIAGNOSIS — M549 Dorsalgia, unspecified: Secondary | ICD-10-CM | POA: Diagnosis not present

## 2021-07-17 DIAGNOSIS — G8929 Other chronic pain: Secondary | ICD-10-CM | POA: Diagnosis not present

## 2021-07-17 DIAGNOSIS — M546 Pain in thoracic spine: Secondary | ICD-10-CM | POA: Diagnosis not present

## 2021-07-17 DIAGNOSIS — X58XXXA Exposure to other specified factors, initial encounter: Secondary | ICD-10-CM | POA: Diagnosis not present

## 2021-07-17 DIAGNOSIS — S299XXA Unspecified injury of thorax, initial encounter: Secondary | ICD-10-CM | POA: Diagnosis not present

## 2021-07-17 DIAGNOSIS — S22080A Wedge compression fracture of T11-T12 vertebra, initial encounter for closed fracture: Secondary | ICD-10-CM | POA: Diagnosis not present

## 2021-07-17 DIAGNOSIS — M40294 Other kyphosis, thoracic region: Secondary | ICD-10-CM | POA: Diagnosis not present

## 2021-07-21 DIAGNOSIS — M546 Pain in thoracic spine: Secondary | ICD-10-CM | POA: Diagnosis not present

## 2021-07-21 DIAGNOSIS — M5459 Other low back pain: Secondary | ICD-10-CM | POA: Diagnosis not present

## 2021-07-21 DIAGNOSIS — R000 Tachycardia, unspecified: Secondary | ICD-10-CM | POA: Diagnosis not present

## 2021-07-21 DIAGNOSIS — R0682 Tachypnea, not elsewhere classified: Secondary | ICD-10-CM | POA: Diagnosis not present

## 2021-07-21 DIAGNOSIS — R079 Chest pain, unspecified: Secondary | ICD-10-CM | POA: Diagnosis not present

## 2021-07-21 DIAGNOSIS — M62838 Other muscle spasm: Secondary | ICD-10-CM | POA: Diagnosis not present

## 2021-07-21 DIAGNOSIS — S299XXA Unspecified injury of thorax, initial encounter: Secondary | ICD-10-CM | POA: Diagnosis not present

## 2021-07-21 DIAGNOSIS — S3992XA Unspecified injury of lower back, initial encounter: Secondary | ICD-10-CM | POA: Diagnosis not present

## 2021-07-21 DIAGNOSIS — J4520 Mild intermittent asthma, uncomplicated: Secondary | ICD-10-CM | POA: Diagnosis not present

## 2021-07-21 DIAGNOSIS — Z6841 Body Mass Index (BMI) 40.0 and over, adult: Secondary | ICD-10-CM | POA: Diagnosis not present

## 2021-07-25 DIAGNOSIS — M25561 Pain in right knee: Secondary | ICD-10-CM | POA: Diagnosis not present

## 2021-07-28 DIAGNOSIS — T148XXA Other injury of unspecified body region, initial encounter: Secondary | ICD-10-CM | POA: Diagnosis not present

## 2021-07-28 DIAGNOSIS — M546 Pain in thoracic spine: Secondary | ICD-10-CM | POA: Diagnosis not present

## 2021-08-05 DIAGNOSIS — L03818 Cellulitis of other sites: Secondary | ICD-10-CM | POA: Diagnosis not present

## 2021-08-08 DIAGNOSIS — Z30013 Encounter for initial prescription of injectable contraceptive: Secondary | ICD-10-CM | POA: Diagnosis not present

## 2021-08-08 DIAGNOSIS — L02215 Cutaneous abscess of perineum: Secondary | ICD-10-CM | POA: Diagnosis not present

## 2021-08-20 DIAGNOSIS — L02212 Cutaneous abscess of back [any part, except buttock]: Secondary | ICD-10-CM | POA: Diagnosis not present

## 2021-08-21 DIAGNOSIS — L02212 Cutaneous abscess of back [any part, except buttock]: Secondary | ICD-10-CM | POA: Diagnosis not present

## 2021-08-21 DIAGNOSIS — Z8639 Personal history of other endocrine, nutritional and metabolic disease: Secondary | ICD-10-CM | POA: Diagnosis not present

## 2021-08-21 DIAGNOSIS — J4520 Mild intermittent asthma, uncomplicated: Secondary | ICD-10-CM | POA: Diagnosis not present

## 2021-08-21 DIAGNOSIS — Z6841 Body Mass Index (BMI) 40.0 and over, adult: Secondary | ICD-10-CM | POA: Diagnosis not present

## 2021-09-03 DIAGNOSIS — J101 Influenza due to other identified influenza virus with other respiratory manifestations: Secondary | ICD-10-CM | POA: Diagnosis not present

## 2021-09-03 DIAGNOSIS — R0602 Shortness of breath: Secondary | ICD-10-CM | POA: Diagnosis not present

## 2021-09-03 DIAGNOSIS — R0600 Dyspnea, unspecified: Secondary | ICD-10-CM | POA: Diagnosis not present

## 2021-09-05 DIAGNOSIS — E119 Type 2 diabetes mellitus without complications: Secondary | ICD-10-CM | POA: Diagnosis not present

## 2021-09-05 DIAGNOSIS — R109 Unspecified abdominal pain: Secondary | ICD-10-CM | POA: Diagnosis not present

## 2021-09-05 DIAGNOSIS — Z79899 Other long term (current) drug therapy: Secondary | ICD-10-CM | POA: Diagnosis not present

## 2021-09-05 DIAGNOSIS — M549 Dorsalgia, unspecified: Secondary | ICD-10-CM | POA: Diagnosis not present

## 2021-09-05 DIAGNOSIS — R0902 Hypoxemia: Secondary | ICD-10-CM | POA: Diagnosis not present

## 2021-09-05 DIAGNOSIS — J101 Influenza due to other identified influenza virus with other respiratory manifestations: Secondary | ICD-10-CM | POA: Diagnosis not present

## 2021-09-05 DIAGNOSIS — M545 Low back pain, unspecified: Secondary | ICD-10-CM | POA: Diagnosis not present

## 2021-09-05 DIAGNOSIS — J4520 Mild intermittent asthma, uncomplicated: Secondary | ICD-10-CM | POA: Diagnosis not present

## 2021-09-05 DIAGNOSIS — X500XXA Overexertion from strenuous movement or load, initial encounter: Secondary | ICD-10-CM | POA: Diagnosis not present

## 2021-09-08 DIAGNOSIS — R399 Unspecified symptoms and signs involving the genitourinary system: Secondary | ICD-10-CM | POA: Diagnosis not present

## 2021-09-08 DIAGNOSIS — N390 Urinary tract infection, site not specified: Secondary | ICD-10-CM | POA: Diagnosis not present

## 2021-09-12 DIAGNOSIS — K429 Umbilical hernia without obstruction or gangrene: Secondary | ICD-10-CM | POA: Diagnosis not present

## 2021-09-12 DIAGNOSIS — E119 Type 2 diabetes mellitus without complications: Secondary | ICD-10-CM | POA: Diagnosis not present

## 2021-09-12 DIAGNOSIS — Z20822 Contact with and (suspected) exposure to covid-19: Secondary | ICD-10-CM | POA: Diagnosis not present

## 2021-09-12 DIAGNOSIS — Z79899 Other long term (current) drug therapy: Secondary | ICD-10-CM | POA: Diagnosis not present

## 2021-09-12 DIAGNOSIS — J4520 Mild intermittent asthma, uncomplicated: Secondary | ICD-10-CM | POA: Diagnosis not present

## 2021-09-12 DIAGNOSIS — R1030 Lower abdominal pain, unspecified: Secondary | ICD-10-CM | POA: Diagnosis not present

## 2021-09-12 DIAGNOSIS — F17200 Nicotine dependence, unspecified, uncomplicated: Secondary | ICD-10-CM | POA: Diagnosis not present

## 2021-09-12 DIAGNOSIS — R509 Fever, unspecified: Secondary | ICD-10-CM | POA: Diagnosis not present

## 2021-09-12 DIAGNOSIS — R109 Unspecified abdominal pain: Secondary | ICD-10-CM | POA: Diagnosis not present

## 2021-09-23 DIAGNOSIS — R197 Diarrhea, unspecified: Secondary | ICD-10-CM | POA: Diagnosis not present

## 2021-09-24 DIAGNOSIS — R112 Nausea with vomiting, unspecified: Secondary | ICD-10-CM | POA: Diagnosis not present

## 2021-09-24 DIAGNOSIS — Z6841 Body Mass Index (BMI) 40.0 and over, adult: Secondary | ICD-10-CM | POA: Diagnosis not present

## 2021-09-24 DIAGNOSIS — R1012 Left upper quadrant pain: Secondary | ICD-10-CM | POA: Diagnosis not present

## 2021-09-24 DIAGNOSIS — R1013 Epigastric pain: Secondary | ICD-10-CM | POA: Diagnosis not present

## 2021-09-24 DIAGNOSIS — E119 Type 2 diabetes mellitus without complications: Secondary | ICD-10-CM | POA: Diagnosis not present

## 2021-09-24 DIAGNOSIS — J4520 Mild intermittent asthma, uncomplicated: Secondary | ICD-10-CM | POA: Diagnosis not present

## 2021-09-24 DIAGNOSIS — R197 Diarrhea, unspecified: Secondary | ICD-10-CM | POA: Diagnosis not present

## 2021-10-05 DIAGNOSIS — R112 Nausea with vomiting, unspecified: Secondary | ICD-10-CM | POA: Diagnosis not present

## 2021-10-07 DIAGNOSIS — R197 Diarrhea, unspecified: Secondary | ICD-10-CM | POA: Diagnosis not present

## 2021-10-07 DIAGNOSIS — R1013 Epigastric pain: Secondary | ICD-10-CM | POA: Diagnosis not present

## 2021-10-07 DIAGNOSIS — R109 Unspecified abdominal pain: Secondary | ICD-10-CM | POA: Diagnosis not present

## 2021-10-07 DIAGNOSIS — R1110 Vomiting, unspecified: Secondary | ICD-10-CM | POA: Diagnosis not present

## 2021-10-10 DIAGNOSIS — R109 Unspecified abdominal pain: Secondary | ICD-10-CM | POA: Diagnosis not present

## 2021-10-10 DIAGNOSIS — R1110 Vomiting, unspecified: Secondary | ICD-10-CM | POA: Diagnosis not present

## 2021-10-10 DIAGNOSIS — R1013 Epigastric pain: Secondary | ICD-10-CM | POA: Diagnosis not present

## 2021-10-10 DIAGNOSIS — R197 Diarrhea, unspecified: Secondary | ICD-10-CM | POA: Diagnosis not present

## 2021-10-13 DIAGNOSIS — R112 Nausea with vomiting, unspecified: Secondary | ICD-10-CM | POA: Diagnosis not present

## 2021-10-13 DIAGNOSIS — J4520 Mild intermittent asthma, uncomplicated: Secondary | ICD-10-CM | POA: Diagnosis not present

## 2021-10-13 DIAGNOSIS — R197 Diarrhea, unspecified: Secondary | ICD-10-CM | POA: Diagnosis not present

## 2021-10-13 DIAGNOSIS — Z6841 Body Mass Index (BMI) 40.0 and over, adult: Secondary | ICD-10-CM | POA: Diagnosis not present

## 2021-10-13 DIAGNOSIS — E119 Type 2 diabetes mellitus without complications: Secondary | ICD-10-CM | POA: Diagnosis not present

## 2021-10-13 DIAGNOSIS — R1084 Generalized abdominal pain: Secondary | ICD-10-CM | POA: Diagnosis not present

## 2021-10-14 DIAGNOSIS — E669 Obesity, unspecified: Secondary | ICD-10-CM | POA: Diagnosis not present

## 2021-10-14 DIAGNOSIS — K449 Diaphragmatic hernia without obstruction or gangrene: Secondary | ICD-10-CM | POA: Diagnosis not present

## 2021-10-14 DIAGNOSIS — R112 Nausea with vomiting, unspecified: Secondary | ICD-10-CM | POA: Diagnosis not present

## 2021-10-14 DIAGNOSIS — Z28310 Unvaccinated for covid-19: Secondary | ICD-10-CM | POA: Diagnosis not present

## 2021-10-14 DIAGNOSIS — G4733 Obstructive sleep apnea (adult) (pediatric): Secondary | ICD-10-CM | POA: Diagnosis not present

## 2021-10-14 DIAGNOSIS — Z6841 Body Mass Index (BMI) 40.0 and over, adult: Secondary | ICD-10-CM | POA: Diagnosis not present

## 2021-10-14 DIAGNOSIS — R197 Diarrhea, unspecified: Secondary | ICD-10-CM | POA: Diagnosis not present

## 2021-10-14 DIAGNOSIS — K635 Polyp of colon: Secondary | ICD-10-CM | POA: Diagnosis not present

## 2021-10-14 DIAGNOSIS — K222 Esophageal obstruction: Secondary | ICD-10-CM | POA: Diagnosis not present

## 2021-10-14 DIAGNOSIS — M329 Systemic lupus erythematosus, unspecified: Secondary | ICD-10-CM | POA: Diagnosis not present

## 2021-10-14 DIAGNOSIS — K52839 Microscopic colitis, unspecified: Secondary | ICD-10-CM | POA: Diagnosis not present

## 2021-10-14 DIAGNOSIS — K3189 Other diseases of stomach and duodenum: Secondary | ICD-10-CM | POA: Diagnosis not present

## 2021-10-14 DIAGNOSIS — K317 Polyp of stomach and duodenum: Secondary | ICD-10-CM | POA: Diagnosis not present

## 2021-10-14 DIAGNOSIS — R1110 Vomiting, unspecified: Secondary | ICD-10-CM | POA: Diagnosis not present

## 2021-10-14 DIAGNOSIS — R1013 Epigastric pain: Secondary | ICD-10-CM | POA: Diagnosis not present

## 2021-10-22 DIAGNOSIS — R1110 Vomiting, unspecified: Secondary | ICD-10-CM | POA: Diagnosis not present

## 2021-10-22 DIAGNOSIS — R1013 Epigastric pain: Secondary | ICD-10-CM | POA: Diagnosis not present

## 2021-10-22 DIAGNOSIS — R197 Diarrhea, unspecified: Secondary | ICD-10-CM | POA: Diagnosis not present

## 2021-10-22 DIAGNOSIS — R109 Unspecified abdominal pain: Secondary | ICD-10-CM | POA: Diagnosis not present

## 2021-10-24 DIAGNOSIS — R194 Change in bowel habit: Secondary | ICD-10-CM | POA: Diagnosis not present

## 2021-10-24 DIAGNOSIS — R197 Diarrhea, unspecified: Secondary | ICD-10-CM | POA: Diagnosis not present

## 2021-10-24 DIAGNOSIS — R1110 Vomiting, unspecified: Secondary | ICD-10-CM | POA: Diagnosis not present

## 2021-10-24 DIAGNOSIS — R1013 Epigastric pain: Secondary | ICD-10-CM | POA: Diagnosis not present

## 2021-11-07 DIAGNOSIS — R059 Cough, unspecified: Secondary | ICD-10-CM | POA: Diagnosis not present

## 2021-11-07 DIAGNOSIS — R0981 Nasal congestion: Secondary | ICD-10-CM | POA: Diagnosis not present

## 2021-11-07 DIAGNOSIS — Z20822 Contact with and (suspected) exposure to covid-19: Secondary | ICD-10-CM | POA: Diagnosis not present

## 2021-11-11 DIAGNOSIS — Z30013 Encounter for initial prescription of injectable contraceptive: Secondary | ICD-10-CM | POA: Diagnosis not present

## 2021-11-18 DIAGNOSIS — E860 Dehydration: Secondary | ICD-10-CM | POA: Diagnosis not present

## 2021-11-25 DIAGNOSIS — E860 Dehydration: Secondary | ICD-10-CM | POA: Diagnosis not present

## 2021-11-26 DIAGNOSIS — R1110 Vomiting, unspecified: Secondary | ICD-10-CM | POA: Diagnosis not present

## 2021-11-26 DIAGNOSIS — R1900 Intra-abdominal and pelvic swelling, mass and lump, unspecified site: Secondary | ICD-10-CM | POA: Diagnosis not present

## 2021-11-26 DIAGNOSIS — R109 Unspecified abdominal pain: Secondary | ICD-10-CM | POA: Diagnosis not present

## 2021-11-26 DIAGNOSIS — R197 Diarrhea, unspecified: Secondary | ICD-10-CM | POA: Diagnosis not present

## 2021-12-08 DIAGNOSIS — R197 Diarrhea, unspecified: Secondary | ICD-10-CM | POA: Diagnosis not present

## 2021-12-08 DIAGNOSIS — R739 Hyperglycemia, unspecified: Secondary | ICD-10-CM | POA: Diagnosis not present

## 2021-12-08 DIAGNOSIS — R140 Abdominal distension (gaseous): Secondary | ICD-10-CM | POA: Diagnosis not present

## 2021-12-08 DIAGNOSIS — Z113 Encounter for screening for infections with a predominantly sexual mode of transmission: Secondary | ICD-10-CM | POA: Diagnosis not present

## 2021-12-08 DIAGNOSIS — Z124 Encounter for screening for malignant neoplasm of cervix: Secondary | ICD-10-CM | POA: Diagnosis not present

## 2021-12-08 DIAGNOSIS — E559 Vitamin D deficiency, unspecified: Secondary | ICD-10-CM | POA: Diagnosis not present

## 2021-12-08 DIAGNOSIS — N898 Other specified noninflammatory disorders of vagina: Secondary | ICD-10-CM | POA: Diagnosis not present

## 2021-12-08 DIAGNOSIS — R109 Unspecified abdominal pain: Secondary | ICD-10-CM | POA: Diagnosis not present

## 2021-12-08 DIAGNOSIS — M329 Systemic lupus erythematosus, unspecified: Secondary | ICD-10-CM | POA: Diagnosis not present

## 2021-12-08 DIAGNOSIS — N819 Female genital prolapse, unspecified: Secondary | ICD-10-CM | POA: Diagnosis not present

## 2021-12-12 DIAGNOSIS — R197 Diarrhea, unspecified: Secondary | ICD-10-CM | POA: Diagnosis not present

## 2021-12-12 DIAGNOSIS — R1110 Vomiting, unspecified: Secondary | ICD-10-CM | POA: Diagnosis not present

## 2021-12-16 DIAGNOSIS — R160 Hepatomegaly, not elsewhere classified: Secondary | ICD-10-CM | POA: Diagnosis not present

## 2021-12-16 DIAGNOSIS — R109 Unspecified abdominal pain: Secondary | ICD-10-CM | POA: Diagnosis not present

## 2021-12-16 DIAGNOSIS — K760 Fatty (change of) liver, not elsewhere classified: Secondary | ICD-10-CM | POA: Diagnosis not present

## 2021-12-16 DIAGNOSIS — R1900 Intra-abdominal and pelvic swelling, mass and lump, unspecified site: Secondary | ICD-10-CM | POA: Diagnosis not present

## 2021-12-19 DIAGNOSIS — R1013 Epigastric pain: Secondary | ICD-10-CM | POA: Diagnosis not present

## 2021-12-19 DIAGNOSIS — R1110 Vomiting, unspecified: Secondary | ICD-10-CM | POA: Diagnosis not present

## 2021-12-26 DIAGNOSIS — R1012 Left upper quadrant pain: Secondary | ICD-10-CM | POA: Diagnosis not present

## 2021-12-31 DIAGNOSIS — R10819 Abdominal tenderness, unspecified site: Secondary | ICD-10-CM | POA: Diagnosis not present

## 2022-01-09 DIAGNOSIS — R112 Nausea with vomiting, unspecified: Secondary | ICD-10-CM | POA: Diagnosis not present

## 2022-01-12 DIAGNOSIS — R197 Diarrhea, unspecified: Secondary | ICD-10-CM | POA: Diagnosis not present

## 2022-01-12 DIAGNOSIS — N719 Inflammatory disease of uterus, unspecified: Secondary | ICD-10-CM | POA: Diagnosis not present

## 2022-01-12 DIAGNOSIS — R1110 Vomiting, unspecified: Secondary | ICD-10-CM | POA: Diagnosis not present

## 2022-01-12 DIAGNOSIS — R109 Unspecified abdominal pain: Secondary | ICD-10-CM | POA: Diagnosis not present

## 2022-01-20 DIAGNOSIS — M329 Systemic lupus erythematosus, unspecified: Secondary | ICD-10-CM | POA: Diagnosis not present

## 2022-01-20 DIAGNOSIS — R197 Diarrhea, unspecified: Secondary | ICD-10-CM | POA: Diagnosis not present

## 2022-01-20 DIAGNOSIS — J4520 Mild intermittent asthma, uncomplicated: Secondary | ICD-10-CM | POA: Diagnosis not present

## 2022-01-20 DIAGNOSIS — R509 Fever, unspecified: Secondary | ICD-10-CM | POA: Diagnosis not present

## 2022-01-20 DIAGNOSIS — R1030 Lower abdominal pain, unspecified: Secondary | ICD-10-CM | POA: Diagnosis not present

## 2022-01-20 DIAGNOSIS — M2550 Pain in unspecified joint: Secondary | ICD-10-CM | POA: Diagnosis not present

## 2022-01-20 DIAGNOSIS — R102 Pelvic and perineal pain: Secondary | ICD-10-CM | POA: Diagnosis not present

## 2022-01-20 DIAGNOSIS — R112 Nausea with vomiting, unspecified: Secondary | ICD-10-CM | POA: Diagnosis not present

## 2022-01-20 DIAGNOSIS — E119 Type 2 diabetes mellitus without complications: Secondary | ICD-10-CM | POA: Diagnosis not present

## 2022-01-26 DIAGNOSIS — M329 Systemic lupus erythematosus, unspecified: Secondary | ICD-10-CM | POA: Diagnosis not present

## 2022-01-26 DIAGNOSIS — R102 Pelvic and perineal pain: Secondary | ICD-10-CM | POA: Diagnosis not present

## 2022-01-27 DIAGNOSIS — Z30013 Encounter for initial prescription of injectable contraceptive: Secondary | ICD-10-CM | POA: Diagnosis not present

## 2022-02-05 DIAGNOSIS — R059 Cough, unspecified: Secondary | ICD-10-CM | POA: Diagnosis not present

## 2022-02-05 DIAGNOSIS — J22 Unspecified acute lower respiratory infection: Secondary | ICD-10-CM | POA: Diagnosis not present

## 2022-02-07 DIAGNOSIS — E119 Type 2 diabetes mellitus without complications: Secondary | ICD-10-CM | POA: Diagnosis not present

## 2022-02-07 DIAGNOSIS — R110 Nausea: Secondary | ICD-10-CM | POA: Diagnosis not present

## 2022-02-07 DIAGNOSIS — R109 Unspecified abdominal pain: Secondary | ICD-10-CM | POA: Diagnosis not present

## 2022-02-07 DIAGNOSIS — R1031 Right lower quadrant pain: Secondary | ICD-10-CM | POA: Diagnosis not present

## 2022-02-07 DIAGNOSIS — J4520 Mild intermittent asthma, uncomplicated: Secondary | ICD-10-CM | POA: Diagnosis not present

## 2022-02-07 DIAGNOSIS — K5730 Diverticulosis of large intestine without perforation or abscess without bleeding: Secondary | ICD-10-CM | POA: Diagnosis not present

## 2022-02-07 DIAGNOSIS — R1030 Lower abdominal pain, unspecified: Secondary | ICD-10-CM | POA: Diagnosis not present

## 2022-02-08 DIAGNOSIS — J4520 Mild intermittent asthma, uncomplicated: Secondary | ICD-10-CM | POA: Diagnosis not present

## 2022-02-08 DIAGNOSIS — R1031 Right lower quadrant pain: Secondary | ICD-10-CM | POA: Diagnosis not present

## 2022-02-08 DIAGNOSIS — K429 Umbilical hernia without obstruction or gangrene: Secondary | ICD-10-CM | POA: Diagnosis not present

## 2022-02-08 DIAGNOSIS — R110 Nausea: Secondary | ICD-10-CM | POA: Diagnosis not present

## 2022-02-08 DIAGNOSIS — E119 Type 2 diabetes mellitus without complications: Secondary | ICD-10-CM | POA: Diagnosis not present

## 2022-02-08 DIAGNOSIS — R109 Unspecified abdominal pain: Secondary | ICD-10-CM | POA: Diagnosis not present

## 2022-02-08 DIAGNOSIS — R197 Diarrhea, unspecified: Secondary | ICD-10-CM | POA: Diagnosis not present

## 2022-02-17 DIAGNOSIS — M2550 Pain in unspecified joint: Secondary | ICD-10-CM | POA: Diagnosis not present

## 2022-02-17 DIAGNOSIS — B999 Unspecified infectious disease: Secondary | ICD-10-CM | POA: Diagnosis not present

## 2022-02-17 DIAGNOSIS — M797 Fibromyalgia: Secondary | ICD-10-CM | POA: Diagnosis not present

## 2022-02-17 DIAGNOSIS — J45909 Unspecified asthma, uncomplicated: Secondary | ICD-10-CM | POA: Diagnosis not present

## 2022-02-18 DIAGNOSIS — M359 Systemic involvement of connective tissue, unspecified: Secondary | ICD-10-CM | POA: Diagnosis not present

## 2022-02-18 DIAGNOSIS — R102 Pelvic and perineal pain: Secondary | ICD-10-CM | POA: Diagnosis not present

## 2022-02-18 DIAGNOSIS — R197 Diarrhea, unspecified: Secondary | ICD-10-CM | POA: Diagnosis not present

## 2022-02-24 DIAGNOSIS — M797 Fibromyalgia: Secondary | ICD-10-CM | POA: Diagnosis not present

## 2022-02-24 DIAGNOSIS — R700 Elevated erythrocyte sedimentation rate: Secondary | ICD-10-CM | POA: Diagnosis not present

## 2022-02-24 DIAGNOSIS — R7982 Elevated C-reactive protein (CRP): Secondary | ICD-10-CM | POA: Diagnosis not present

## 2022-02-24 DIAGNOSIS — Z6841 Body Mass Index (BMI) 40.0 and over, adult: Secondary | ICD-10-CM | POA: Diagnosis not present

## 2022-03-10 DIAGNOSIS — K625 Hemorrhage of anus and rectum: Secondary | ICD-10-CM | POA: Diagnosis not present

## 2022-03-10 DIAGNOSIS — R109 Unspecified abdominal pain: Secondary | ICD-10-CM | POA: Diagnosis not present

## 2022-03-10 DIAGNOSIS — R197 Diarrhea, unspecified: Secondary | ICD-10-CM | POA: Diagnosis not present

## 2022-03-10 DIAGNOSIS — N719 Inflammatory disease of uterus, unspecified: Secondary | ICD-10-CM | POA: Diagnosis not present

## 2022-03-11 DIAGNOSIS — J45909 Unspecified asthma, uncomplicated: Secondary | ICD-10-CM | POA: Diagnosis not present

## 2022-03-11 DIAGNOSIS — B999 Unspecified infectious disease: Secondary | ICD-10-CM | POA: Diagnosis not present

## 2022-03-11 DIAGNOSIS — M2550 Pain in unspecified joint: Secondary | ICD-10-CM | POA: Diagnosis not present

## 2022-03-11 DIAGNOSIS — M797 Fibromyalgia: Secondary | ICD-10-CM | POA: Diagnosis not present

## 2022-03-13 DIAGNOSIS — R109 Unspecified abdominal pain: Secondary | ICD-10-CM | POA: Diagnosis not present

## 2022-03-13 DIAGNOSIS — K921 Melena: Secondary | ICD-10-CM | POA: Diagnosis not present

## 2022-03-13 DIAGNOSIS — K625 Hemorrhage of anus and rectum: Secondary | ICD-10-CM | POA: Diagnosis not present

## 2022-03-13 DIAGNOSIS — R1110 Vomiting, unspecified: Secondary | ICD-10-CM | POA: Diagnosis not present

## 2022-03-13 DIAGNOSIS — N719 Inflammatory disease of uterus, unspecified: Secondary | ICD-10-CM | POA: Diagnosis not present

## 2022-03-26 DIAGNOSIS — G4733 Obstructive sleep apnea (adult) (pediatric): Secondary | ICD-10-CM | POA: Diagnosis not present

## 2022-03-26 DIAGNOSIS — R7302 Impaired glucose tolerance (oral): Secondary | ICD-10-CM | POA: Diagnosis not present

## 2022-03-26 DIAGNOSIS — Z6841 Body Mass Index (BMI) 40.0 and over, adult: Secondary | ICD-10-CM | POA: Diagnosis not present

## 2022-04-03 DIAGNOSIS — G43011 Migraine without aura, intractable, with status migrainosus: Secondary | ICD-10-CM | POA: Diagnosis not present

## 2022-04-08 DIAGNOSIS — J4520 Mild intermittent asthma, uncomplicated: Secondary | ICD-10-CM | POA: Diagnosis not present

## 2022-04-08 DIAGNOSIS — E119 Type 2 diabetes mellitus without complications: Secondary | ICD-10-CM | POA: Diagnosis not present

## 2022-04-08 DIAGNOSIS — R42 Dizziness and giddiness: Secondary | ICD-10-CM | POA: Diagnosis not present

## 2022-04-08 DIAGNOSIS — F418 Other specified anxiety disorders: Secondary | ICD-10-CM | POA: Diagnosis not present

## 2022-04-08 DIAGNOSIS — F54 Psychological and behavioral factors associated with disorders or diseases classified elsewhere: Secondary | ICD-10-CM | POA: Diagnosis not present

## 2022-04-08 DIAGNOSIS — Z6841 Body Mass Index (BMI) 40.0 and over, adult: Secondary | ICD-10-CM | POA: Diagnosis not present

## 2022-04-08 DIAGNOSIS — H538 Other visual disturbances: Secondary | ICD-10-CM | POA: Diagnosis not present

## 2022-04-08 DIAGNOSIS — R519 Headache, unspecified: Secondary | ICD-10-CM | POA: Diagnosis not present

## 2022-04-16 DIAGNOSIS — J4520 Mild intermittent asthma, uncomplicated: Secondary | ICD-10-CM | POA: Diagnosis not present

## 2022-04-16 DIAGNOSIS — M62830 Muscle spasm of back: Secondary | ICD-10-CM | POA: Diagnosis not present

## 2022-04-21 DIAGNOSIS — Z30013 Encounter for initial prescription of injectable contraceptive: Secondary | ICD-10-CM | POA: Diagnosis not present

## 2022-04-21 DIAGNOSIS — M25541 Pain in joints of right hand: Secondary | ICD-10-CM | POA: Diagnosis not present

## 2022-04-21 DIAGNOSIS — R102 Pelvic and perineal pain: Secondary | ICD-10-CM | POA: Diagnosis not present

## 2022-04-24 DIAGNOSIS — M25512 Pain in left shoulder: Secondary | ICD-10-CM | POA: Diagnosis not present

## 2022-04-28 DIAGNOSIS — G44219 Episodic tension-type headache, not intractable: Secondary | ICD-10-CM | POA: Diagnosis not present

## 2022-04-29 DIAGNOSIS — G44219 Episodic tension-type headache, not intractable: Secondary | ICD-10-CM | POA: Diagnosis not present

## 2022-05-15 DIAGNOSIS — S56911A Strain of unspecified muscles, fascia and tendons at forearm level, right arm, initial encounter: Secondary | ICD-10-CM | POA: Diagnosis not present

## 2022-05-15 DIAGNOSIS — M25511 Pain in right shoulder: Secondary | ICD-10-CM | POA: Diagnosis not present

## 2022-05-15 DIAGNOSIS — Z79899 Other long term (current) drug therapy: Secondary | ICD-10-CM | POA: Diagnosis not present

## 2022-05-15 DIAGNOSIS — X58XXXA Exposure to other specified factors, initial encounter: Secondary | ICD-10-CM | POA: Diagnosis not present

## 2022-05-15 DIAGNOSIS — S46811A Strain of other muscles, fascia and tendons at shoulder and upper arm level, right arm, initial encounter: Secondary | ICD-10-CM | POA: Diagnosis not present

## 2022-05-15 DIAGNOSIS — S46911A Strain of unspecified muscle, fascia and tendon at shoulder and upper arm level, right arm, initial encounter: Secondary | ICD-10-CM | POA: Diagnosis not present

## 2022-05-20 DIAGNOSIS — L905 Scar conditions and fibrosis of skin: Secondary | ICD-10-CM | POA: Diagnosis not present

## 2022-05-20 DIAGNOSIS — L218 Other seborrheic dermatitis: Secondary | ICD-10-CM | POA: Diagnosis not present

## 2022-06-01 DIAGNOSIS — M25311 Other instability, right shoulder: Secondary | ICD-10-CM | POA: Diagnosis not present

## 2022-06-01 DIAGNOSIS — M25511 Pain in right shoulder: Secondary | ICD-10-CM | POA: Diagnosis not present

## 2022-06-01 DIAGNOSIS — M542 Cervicalgia: Secondary | ICD-10-CM | POA: Diagnosis not present

## 2022-06-01 DIAGNOSIS — M25521 Pain in right elbow: Secondary | ICD-10-CM | POA: Diagnosis not present

## 2022-06-17 DIAGNOSIS — N898 Other specified noninflammatory disorders of vagina: Secondary | ICD-10-CM | POA: Diagnosis not present

## 2022-06-17 DIAGNOSIS — M545 Low back pain, unspecified: Secondary | ICD-10-CM | POA: Diagnosis not present

## 2022-06-17 DIAGNOSIS — G8929 Other chronic pain: Secondary | ICD-10-CM | POA: Diagnosis not present

## 2022-06-17 DIAGNOSIS — R102 Pelvic and perineal pain: Secondary | ICD-10-CM | POA: Diagnosis not present

## 2022-06-17 DIAGNOSIS — M25551 Pain in right hip: Secondary | ICD-10-CM | POA: Diagnosis not present

## 2022-06-28 DIAGNOSIS — R059 Cough, unspecified: Secondary | ICD-10-CM | POA: Diagnosis not present

## 2022-06-28 DIAGNOSIS — M329 Systemic lupus erythematosus, unspecified: Secondary | ICD-10-CM | POA: Diagnosis not present

## 2022-06-28 DIAGNOSIS — J019 Acute sinusitis, unspecified: Secondary | ICD-10-CM | POA: Diagnosis not present

## 2022-07-03 DIAGNOSIS — G43011 Migraine without aura, intractable, with status migrainosus: Secondary | ICD-10-CM | POA: Diagnosis not present

## 2022-07-07 DIAGNOSIS — R519 Headache, unspecified: Secondary | ICD-10-CM | POA: Diagnosis not present

## 2022-07-07 DIAGNOSIS — Z79899 Other long term (current) drug therapy: Secondary | ICD-10-CM | POA: Diagnosis not present

## 2022-07-07 DIAGNOSIS — R000 Tachycardia, unspecified: Secondary | ICD-10-CM | POA: Diagnosis not present

## 2022-07-14 DIAGNOSIS — Z30013 Encounter for initial prescription of injectable contraceptive: Secondary | ICD-10-CM | POA: Diagnosis not present

## 2022-07-16 DIAGNOSIS — Z6841 Body Mass Index (BMI) 40.0 and over, adult: Secondary | ICD-10-CM | POA: Diagnosis not present

## 2022-07-16 DIAGNOSIS — Z8639 Personal history of other endocrine, nutritional and metabolic disease: Secondary | ICD-10-CM | POA: Diagnosis not present

## 2022-08-05 DIAGNOSIS — N760 Acute vaginitis: Secondary | ICD-10-CM | POA: Diagnosis not present

## 2022-08-07 DIAGNOSIS — G43011 Migraine without aura, intractable, with status migrainosus: Secondary | ICD-10-CM | POA: Diagnosis not present

## 2022-08-18 DIAGNOSIS — J4520 Mild intermittent asthma, uncomplicated: Secondary | ICD-10-CM | POA: Diagnosis not present

## 2022-08-18 DIAGNOSIS — R102 Pelvic and perineal pain: Secondary | ICD-10-CM | POA: Diagnosis not present

## 2022-08-18 DIAGNOSIS — E119 Type 2 diabetes mellitus without complications: Secondary | ICD-10-CM | POA: Diagnosis not present

## 2022-08-18 DIAGNOSIS — N838 Other noninflammatory disorders of ovary, fallopian tube and broad ligament: Secondary | ICD-10-CM | POA: Diagnosis not present

## 2022-08-18 DIAGNOSIS — Z79899 Other long term (current) drug therapy: Secondary | ICD-10-CM | POA: Diagnosis not present

## 2022-08-18 DIAGNOSIS — R109 Unspecified abdominal pain: Secondary | ICD-10-CM | POA: Diagnosis not present

## 2022-08-18 DIAGNOSIS — R509 Fever, unspecified: Secondary | ICD-10-CM | POA: Diagnosis not present

## 2022-08-21 DIAGNOSIS — Z302 Encounter for sterilization: Secondary | ICD-10-CM | POA: Diagnosis not present

## 2022-08-21 DIAGNOSIS — Z4682 Encounter for fitting and adjustment of non-vascular catheter: Secondary | ICD-10-CM | POA: Diagnosis not present

## 2022-08-21 DIAGNOSIS — Z881 Allergy status to other antibiotic agents status: Secondary | ICD-10-CM | POA: Diagnosis not present

## 2022-08-21 DIAGNOSIS — N858 Other specified noninflammatory disorders of uterus: Secondary | ICD-10-CM | POA: Diagnosis not present

## 2022-08-21 DIAGNOSIS — E669 Obesity, unspecified: Secondary | ICD-10-CM | POA: Diagnosis not present

## 2022-08-21 DIAGNOSIS — N719 Inflammatory disease of uterus, unspecified: Secondary | ICD-10-CM | POA: Diagnosis not present

## 2022-08-21 DIAGNOSIS — N83202 Unspecified ovarian cyst, left side: Secondary | ICD-10-CM | POA: Diagnosis not present

## 2022-08-21 DIAGNOSIS — Z888 Allergy status to other drugs, medicaments and biological substances status: Secondary | ICD-10-CM | POA: Diagnosis not present

## 2022-08-21 DIAGNOSIS — N840 Polyp of corpus uteri: Secondary | ICD-10-CM | POA: Diagnosis not present

## 2022-08-21 DIAGNOSIS — G43909 Migraine, unspecified, not intractable, without status migrainosus: Secondary | ICD-10-CM | POA: Diagnosis not present

## 2022-08-21 DIAGNOSIS — F419 Anxiety disorder, unspecified: Secondary | ICD-10-CM | POA: Diagnosis not present

## 2022-08-21 DIAGNOSIS — N736 Female pelvic peritoneal adhesions (postinfective): Secondary | ICD-10-CM | POA: Diagnosis not present

## 2022-08-21 DIAGNOSIS — F32A Depression, unspecified: Secondary | ICD-10-CM | POA: Diagnosis not present

## 2022-08-21 DIAGNOSIS — F329 Major depressive disorder, single episode, unspecified: Secondary | ICD-10-CM | POA: Diagnosis not present

## 2022-08-21 DIAGNOSIS — E739 Lactose intolerance, unspecified: Secondary | ICD-10-CM | POA: Diagnosis not present

## 2022-08-21 DIAGNOSIS — R059 Cough, unspecified: Secondary | ICD-10-CM | POA: Diagnosis not present

## 2022-08-21 DIAGNOSIS — N711 Chronic inflammatory disease of uterus: Secondary | ICD-10-CM | POA: Diagnosis not present

## 2022-08-21 DIAGNOSIS — G8929 Other chronic pain: Secondary | ICD-10-CM | POA: Diagnosis not present

## 2022-08-21 DIAGNOSIS — N9489 Other specified conditions associated with female genital organs and menstrual cycle: Secondary | ICD-10-CM | POA: Diagnosis not present

## 2022-08-21 DIAGNOSIS — Z6841 Body Mass Index (BMI) 40.0 and over, adult: Secondary | ICD-10-CM | POA: Diagnosis not present

## 2022-08-21 DIAGNOSIS — N841 Polyp of cervix uteri: Secondary | ICD-10-CM | POA: Diagnosis not present

## 2022-09-04 DIAGNOSIS — N803 Endometriosis of pelvic peritoneum, unspecified: Secondary | ICD-10-CM | POA: Diagnosis not present

## 2022-09-04 DIAGNOSIS — Z9889 Other specified postprocedural states: Secondary | ICD-10-CM | POA: Diagnosis not present

## 2022-09-04 DIAGNOSIS — M329 Systemic lupus erythematosus, unspecified: Secondary | ICD-10-CM | POA: Diagnosis not present

## 2022-09-04 DIAGNOSIS — Z9189 Other specified personal risk factors, not elsewhere classified: Secondary | ICD-10-CM | POA: Diagnosis not present

## 2022-09-07 DIAGNOSIS — Z6841 Body Mass Index (BMI) 40.0 and over, adult: Secondary | ICD-10-CM | POA: Diagnosis not present

## 2022-09-10 DIAGNOSIS — M6289 Other specified disorders of muscle: Secondary | ICD-10-CM | POA: Diagnosis not present

## 2022-09-14 DIAGNOSIS — N803 Endometriosis of pelvic peritoneum, unspecified: Secondary | ICD-10-CM | POA: Diagnosis not present

## 2022-09-17 DIAGNOSIS — R102 Pelvic and perineal pain: Secondary | ICD-10-CM | POA: Diagnosis not present

## 2022-09-17 DIAGNOSIS — M5459 Other low back pain: Secondary | ICD-10-CM | POA: Diagnosis not present

## 2022-09-17 DIAGNOSIS — M6281 Muscle weakness (generalized): Secondary | ICD-10-CM | POA: Diagnosis not present

## 2022-09-22 DIAGNOSIS — M25572 Pain in left ankle and joints of left foot: Secondary | ICD-10-CM | POA: Diagnosis not present

## 2022-09-22 DIAGNOSIS — Z9189 Other specified personal risk factors, not elsewhere classified: Secondary | ICD-10-CM | POA: Diagnosis not present

## 2022-09-25 DIAGNOSIS — R0602 Shortness of breath: Secondary | ICD-10-CM | POA: Diagnosis not present

## 2022-09-29 DIAGNOSIS — R0602 Shortness of breath: Secondary | ICD-10-CM | POA: Diagnosis not present

## 2022-09-29 DIAGNOSIS — R0600 Dyspnea, unspecified: Secondary | ICD-10-CM | POA: Diagnosis not present

## 2022-09-29 DIAGNOSIS — G4733 Obstructive sleep apnea (adult) (pediatric): Secondary | ICD-10-CM | POA: Diagnosis not present

## 2022-09-29 DIAGNOSIS — M79604 Pain in right leg: Secondary | ICD-10-CM | POA: Diagnosis not present

## 2022-09-29 DIAGNOSIS — M79662 Pain in left lower leg: Secondary | ICD-10-CM | POA: Diagnosis not present

## 2022-09-29 DIAGNOSIS — J4520 Mild intermittent asthma, uncomplicated: Secondary | ICD-10-CM | POA: Diagnosis not present

## 2022-09-29 DIAGNOSIS — R519 Headache, unspecified: Secondary | ICD-10-CM | POA: Diagnosis not present

## 2022-09-29 DIAGNOSIS — R079 Chest pain, unspecified: Secondary | ICD-10-CM | POA: Diagnosis not present

## 2022-09-29 DIAGNOSIS — E119 Type 2 diabetes mellitus without complications: Secondary | ICD-10-CM | POA: Diagnosis not present

## 2022-09-29 DIAGNOSIS — Z6841 Body Mass Index (BMI) 40.0 and over, adult: Secondary | ICD-10-CM | POA: Diagnosis not present

## 2022-09-29 DIAGNOSIS — Z79899 Other long term (current) drug therapy: Secondary | ICD-10-CM | POA: Diagnosis not present

## 2022-09-29 DIAGNOSIS — M79605 Pain in left leg: Secondary | ICD-10-CM | POA: Diagnosis not present

## 2022-09-29 DIAGNOSIS — M7989 Other specified soft tissue disorders: Secondary | ICD-10-CM | POA: Diagnosis not present

## 2022-09-29 DIAGNOSIS — M79661 Pain in right lower leg: Secondary | ICD-10-CM | POA: Diagnosis not present

## 2022-09-29 DIAGNOSIS — R42 Dizziness and giddiness: Secondary | ICD-10-CM | POA: Diagnosis not present

## 2022-10-05 DIAGNOSIS — M6281 Muscle weakness (generalized): Secondary | ICD-10-CM | POA: Diagnosis not present

## 2022-10-05 DIAGNOSIS — Z30013 Encounter for initial prescription of injectable contraceptive: Secondary | ICD-10-CM | POA: Diagnosis not present

## 2022-10-05 DIAGNOSIS — M5459 Other low back pain: Secondary | ICD-10-CM | POA: Diagnosis not present

## 2022-10-05 DIAGNOSIS — R102 Pelvic and perineal pain: Secondary | ICD-10-CM | POA: Diagnosis not present

## 2022-10-07 DIAGNOSIS — Z111 Encounter for screening for respiratory tuberculosis: Secondary | ICD-10-CM | POA: Diagnosis not present

## 2022-10-14 DIAGNOSIS — R102 Pelvic and perineal pain: Secondary | ICD-10-CM | POA: Diagnosis not present

## 2022-10-14 DIAGNOSIS — M5459 Other low back pain: Secondary | ICD-10-CM | POA: Diagnosis not present

## 2022-10-14 DIAGNOSIS — M6281 Muscle weakness (generalized): Secondary | ICD-10-CM | POA: Diagnosis not present

## 2022-10-16 DIAGNOSIS — N803 Endometriosis of pelvic peritoneum, unspecified: Secondary | ICD-10-CM | POA: Diagnosis not present

## 2022-10-16 DIAGNOSIS — Z9189 Other specified personal risk factors, not elsewhere classified: Secondary | ICD-10-CM | POA: Diagnosis not present

## 2022-10-16 DIAGNOSIS — N711 Chronic inflammatory disease of uterus: Secondary | ICD-10-CM | POA: Diagnosis not present

## 2022-10-21 DIAGNOSIS — R102 Pelvic and perineal pain: Secondary | ICD-10-CM | POA: Diagnosis not present

## 2022-10-21 DIAGNOSIS — M6281 Muscle weakness (generalized): Secondary | ICD-10-CM | POA: Diagnosis not present

## 2022-10-21 DIAGNOSIS — M5459 Other low back pain: Secondary | ICD-10-CM | POA: Diagnosis not present

## 2022-10-26 DIAGNOSIS — F4323 Adjustment disorder with mixed anxiety and depressed mood: Secondary | ICD-10-CM | POA: Diagnosis not present

## 2022-10-30 DIAGNOSIS — F4323 Adjustment disorder with mixed anxiety and depressed mood: Secondary | ICD-10-CM | POA: Diagnosis not present

## 2022-11-03 DIAGNOSIS — J069 Acute upper respiratory infection, unspecified: Secondary | ICD-10-CM | POA: Diagnosis not present

## 2022-11-03 DIAGNOSIS — Z79899 Other long term (current) drug therapy: Secondary | ICD-10-CM | POA: Diagnosis not present

## 2022-11-03 DIAGNOSIS — R062 Wheezing: Secondary | ICD-10-CM | POA: Diagnosis not present

## 2022-11-03 DIAGNOSIS — R059 Cough, unspecified: Secondary | ICD-10-CM | POA: Diagnosis not present

## 2022-11-05 DIAGNOSIS — H5213 Myopia, bilateral: Secondary | ICD-10-CM | POA: Diagnosis not present

## 2022-11-06 DIAGNOSIS — F4323 Adjustment disorder with mixed anxiety and depressed mood: Secondary | ICD-10-CM | POA: Diagnosis not present

## 2022-11-09 HISTORY — PX: BREAST LUMPECTOMY: SHX2

## 2022-11-19 ENCOUNTER — Encounter (HOSPITAL_COMMUNITY): Payer: Self-pay

## 2022-11-19 ENCOUNTER — Ambulatory Visit (HOSPITAL_COMMUNITY)
Admission: EM | Admit: 2022-11-19 | Discharge: 2022-11-19 | Disposition: A | Discharge status: Home / Self Care | Payer: Medicaid Other

## 2022-11-19 DIAGNOSIS — M549 Dorsalgia, unspecified: Secondary | ICD-10-CM | POA: Diagnosis not present

## 2022-11-19 HISTORY — DX: Endometriosis, unspecified: N80.9

## 2022-11-19 HISTORY — DX: Sleep apnea, unspecified: G47.30

## 2022-11-19 HISTORY — DX: Unspecified asthma, uncomplicated: J45.909

## 2022-11-19 MED ORDER — PREDNISONE 10 MG (21) PO TBPK: ORAL_TABLET | Freq: Every day | ORAL | 0 refills | Status: DC

## 2022-11-19 MED ORDER — METHOCARBAMOL 750 MG PO TABS: 1500.0000 mg | ORAL_TABLET | Freq: Three times a day (TID) | ORAL | 0 refills | Status: DC | PRN

## 2022-11-19 NOTE — ED Triage Notes (Addendum)
Chief Complaint: back pain and possible abscesses on the low back, States it grew larger and she pressed on it, it then popped. States the area is still there and hard. Patient has history of t12 and t11 fracture. Fractured her back 2017. Pain In the mi back with breathing, shoots throughout the entire back.   Onset: December   Prescriptions or OTC medications tried: Yes- old antibiotics    with no relief

## 2022-11-19 NOTE — Discharge Instructions (Signed)
Start prednisone taper Robaxin 3 times a day as needed Heat to the back as needed Follow-up with PCP once established Please go to the emergency room if you have any worsening symptoms

## 2022-11-19 NOTE — ED Provider Notes (Addendum)
[facility]    CSN: [id number] Arrival date & time: 11/19/22  1418      History   Chief Complaint Chief Complaint  Patient presents with   Abscess   Back Pain    HPI Jade Lopez is a 27 y.o. female  who presents for evaluation of upper back pain for 1-2 weeks. Patient describes the pain as an aching pain that radiates from her mid back to her upper back.  Denies any known injury or inciting event.  She does have a history of T11 and 12 fractures in the past that did not require surgery.  She states she has chronic pain in that area which she normally takes Lyrica for, but due to insurance issues is not currently on.  Denies any numbness/tingling/weakness of her upper extremities.  No neck pain.  She has been taking of previous prescription of cyclobenzaprine with minimal improvement.  She is new to the area having moved from [place] and is try to establish with a PCP.  No OTC medications have been used.  In addition she had an abscess on her left mid back that was treated with antibiotics at another urgent care.  She states area still feels hard and is concerned it may still be there.  Denies any fevers, chills, drainage from the area.  She does have a history of MRSA.  No other concerns at this time.   Abscess Back Pain   Past Medical History:  Diagnosis Date   Asthma    Autoimmune disease (HCC)    Endometriosis    Fibromyalgia    Sleep apnea     There are no problems to display for this patient.   Past Surgical History:  Procedure Laterality Date   CHOLECYSTECTOMY     DILATATION AND CURETTAGE/HYSTEROSCOPY WITH MINERVA     TONSILLECTOMY     TONSILLECTOMY AND ADENOIDECTOMY      OB History   No obstetric history on file.      Home Medications    Prior to Admission medications   Medication Sig Start Date End Date Taking? Authorizing Provider  busPIRone (BUSPAR) 10 MG tablet 30 mg. 06/02/22  Yes [provider]  methocarbamol (ROBAXIN-750)  750 MG tablet Take 2 tablets (1,500 mg total) by mouth 3 (three) times daily as needed for muscle spasms. 11/19/22  Yes [name], NP  predniSONE (STERAPRED UNI-PAK 21 TAB) 10 MG (21) TBPK tablet Take by mouth daily. Take 6 tabs by mouth daily  for 1 days, then 5 tabs for 1 days, then 4 tabs for 1 days, then 3 tabs for 1 days, 2 tabs for 1 days, then 1 tab by mouth daily for 1 days 11/19/22  Yes [name], NP  pregabalin (LYRICA) 50 MG capsule    Yes [provider]  albuterol (ACCUNEB) 1.25 MG/3ML nebulizer solution Take 1 ampule by nebulization every 6 (six) hours as needed for wheezing.    [provider]  albuterol (VENTOLIN HFA) 108 (90 Base) MCG/ACT inhaler Inhale into the lungs every 6 (six) hours as needed for wheezing or shortness of breath.    [provider]  ketorolac (TORADOL) 10 MG tablet Take 1 tablet (10 mg total) by mouth every 8 (eight) hours as needed. 07/30/17   Payton Mccallum, MD    Family History Family History  Problem Relation Age of Onset   Diabetes Mother    Hypertension Mother    Diabetes Father    Hypertension Father  Social History Social History   Tobacco Use   Smoking status: Never   Smokeless tobacco: Never  Substance Use Topics   Alcohol use: Yes   Drug use: No     Allergies   Other, Pregabalin, and Clindamycin/lincomycin   Review of Systems Review of Systems  Musculoskeletal:  Positive for back pain.     Physical Exam Triage Vital Signs ED Triage Vitals  Enc Vitals Group     BP 11/19/22 1603 (!) 138/97     Pulse Rate 11/19/22 1603 (!) 108     Resp 11/19/22 1603 16     Temp 11/19/22 1603 99 F (37.2 C)     Temp Source 11/19/22 1603 Oral     SpO2 11/19/22 1603 98 %     Weight --      Height --      Head Circumference --      Peak Flow --      Pain Score 11/19/22 1554 9     Pain Loc --      Pain Edu? --      Excl. in [place]? --    No data found.  Updated Vital Signs BP (!) 138/97 (BP Location:  Right Wrist)   Pulse (!) 108   Temp 99 F (37.2 C) (Oral)   Resp 16   SpO2 98%   Visual Acuity Right Eye Distance:   Left Eye Distance:   Bilateral Distance:    Right Eye Near:   Left Eye Near:    Bilateral Near:     Physical Exam Vitals and nursing note reviewed.  Constitutional:      Appearance: Normal appearance.  HENT:     Head: Normocephalic and atraumatic.  Eyes:     Pupils: Pupils are equal, round, and reactive to light.  Cardiovascular:     Rate and Rhythm: Normal rate.  Pulmonary:     Effort: Pulmonary effort is normal.  Musculoskeletal:       Back:     Comments: No lumbar spine TTP.  Equal strength upper extremities bilaterally at 5 out of 5.   Skin:    General: Skin is warm and dry.  Neurological:     General: No focal deficit present.     Mental Status: She is alert and oriented to person, place, and time.  Psychiatric:        Mood and Affect: Mood normal.        Behavior: Behavior normal.      UC Treatments / Results  Labs (all labs ordered are listed, but only abnormal results are displayed) Labs Reviewed - No data to display  EKG   Radiology No results found.  Procedures Procedures (including critical care time)  Medications Ordered in UC Medications - No data to display  Initial Impression / Assessment and Plan / UC Course  I have reviewed the triage vital signs and the nursing notes.  Pertinent labs & imaging results that were available during my care of the patient were reviewed by me and considered in my medical decision making (see chart for details).     Reviewed exam and symptoms with patient.  No red flags on exam. Reassured no current abscess to location of concern. Discussed msk cause of upper back pain. NKI. Pt has tolerated prednisone in the past and states it has helped her back pain. PT states she is no longer diabetic.  Prednisone taper Will try robaxin as pt reports no improvement with flexeril. S/E profile  reviewed Heat to the back as needed Rest Encouraged to establish with a PCP for further treatment options and evaluation  ER precautions reviewed and pt verbalized understanding  Final Clinical Impressions(s) / UC Diagnoses   Final diagnoses:  Upper back pain     Discharge Instructions      Start prednisone taper Robaxin 3 times a day as needed Heat to the back as needed Follow-up with PCP once established Please go to the emergency room if you have any worsening symptoms   ED Prescriptions     Medication Sig Dispense Auth. Provider   methocarbamol (ROBAXIN-750) 750 MG tablet Take 2 tablets (1,500 mg total) by mouth 3 (three) times daily as needed for muscle spasms. 30 tablet Melynda Ripple, NP   predniSONE (STERAPRED UNI-PAK 21 TAB) 10 MG (21) TBPK tablet Take by mouth daily. Take 6 tabs by mouth daily  for 1 days, then 5 tabs for 1 days, then 4 tabs for 1 days, then 3 tabs for 1 days, 2 tabs for 1 days, then 1 tab by mouth daily for 1 days 21 tablet Melynda Ripple, NP      PDMP not reviewed this encounter.   Melynda Ripple, NP 11/19/22 1631    Melynda Ripple, NP 11/19/22 [phone]

## 2022-11-25 ENCOUNTER — Encounter: Payer: Self-pay | Admitting: Student

## 2022-11-25 ENCOUNTER — Other Ambulatory Visit: Payer: Self-pay

## 2022-11-25 ENCOUNTER — Ambulatory Visit: Payer: Medicaid Other | Admitting: Student

## 2022-11-25 VITALS — BP 116/72 | HR 100 | Temp 98.2°F | Resp 32 | Ht 65.5 in | Wt >= 6400 oz

## 2022-11-25 DIAGNOSIS — M797 Fibromyalgia: Secondary | ICD-10-CM | POA: Diagnosis not present

## 2022-11-25 DIAGNOSIS — M359 Systemic involvement of connective tissue, unspecified: Secondary | ICD-10-CM | POA: Diagnosis not present

## 2022-11-25 DIAGNOSIS — E559 Vitamin D deficiency, unspecified: Secondary | ICD-10-CM

## 2022-11-25 DIAGNOSIS — F419 Anxiety disorder, unspecified: Secondary | ICD-10-CM | POA: Diagnosis not present

## 2022-11-25 DIAGNOSIS — R197 Diarrhea, unspecified: Secondary | ICD-10-CM

## 2022-11-25 DIAGNOSIS — E119 Type 2 diabetes mellitus without complications: Secondary | ICD-10-CM | POA: Diagnosis not present

## 2022-11-25 DIAGNOSIS — J45909 Unspecified asthma, uncomplicated: Secondary | ICD-10-CM | POA: Diagnosis not present

## 2022-11-25 DIAGNOSIS — R748 Abnormal levels of other serum enzymes: Secondary | ICD-10-CM

## 2022-11-25 DIAGNOSIS — Z7984 Long term (current) use of oral hypoglycemic drugs: Secondary | ICD-10-CM

## 2022-11-25 DIAGNOSIS — N809 Endometriosis, unspecified: Secondary | ICD-10-CM | POA: Diagnosis not present

## 2022-11-25 MED ORDER — PREGABALIN 50 MG PO CAPS: 50.0000 mg | ORAL_CAPSULE | Freq: Two times a day (BID) | ORAL | 2 refills | Status: DC

## 2022-11-25 MED ORDER — PREGABALIN 50 MG PO CAPS: 50.0000 mg | ORAL_CAPSULE | Freq: Every evening | ORAL | 3 refills | Status: DC

## 2022-11-25 MED ORDER — BUSPIRONE HCL 10 MG PO TABS: 30.0000 mg | ORAL_TABLET | Freq: Every day | ORAL | 5 refills | Status: DC

## 2022-11-25 NOTE — Patient Instructions (Signed)
We will see you in 1 week and see what testing we need after we get your records back.

## 2022-11-26 LAB — CBC
Hematocrit: 43.1 % (ref 34.0–46.6)
Hemoglobin: 13.7 g/dL (ref 11.1–15.9)
MCH: 25.4 pg — ABNORMAL LOW (ref 26.6–33.0)
MCHC: 31.8 g/dL (ref 31.5–35.7)
MCV: 80 fL (ref 79–97)
Platelets: 299 10*3/uL (ref 150–450)
RBC: 5.4 x10E6/uL — ABNORMAL HIGH (ref 3.77–5.28)
RDW: 13.5 % (ref 11.7–15.4)
WBC: 9.5 10*3/uL (ref 3.4–10.8)

## 2022-11-26 LAB — CMP14 + ANION GAP
ALT: 10 IU/L (ref 0–32)
AST: 10 IU/L (ref 0–40)
Albumin/Globulin Ratio: 1.3 (ref 1.2–2.2)
Albumin: 3.7 g/dL — ABNORMAL LOW (ref 4.0–5.0)
Alkaline Phosphatase: 144 IU/L — ABNORMAL HIGH (ref 44–121)
Anion Gap: 18 mmol/L (ref 10.0–18.0)
BUN/Creatinine Ratio: 10 (ref 9–23)
BUN: 7 mg/dL (ref 6–20)
Bilirubin Total: <0.2 mg/dL (ref 0.0–1.2)
CO2: 21 mmol/L (ref 20–29)
Calcium: 8.9 mg/dL (ref 8.7–10.2)
Chloride: 102 mmol/L (ref 96–106)
Creatinine, Ser: 0.72 mg/dL (ref 0.57–1.00)
Globulin, Total: 2.9 g/dL (ref 1.5–4.5)
Glucose: 152 mg/dL — ABNORMAL HIGH (ref 70–99)
Potassium: 4.3 mmol/L (ref 3.5–5.2)
Sodium: 141 mmol/L (ref 134–144)
Total Protein: 6.6 g/dL (ref 6.0–8.5)
eGFR: 118 mL/min/{1.73_m2} (ref >59)

## 2022-11-26 LAB — VITAMIN B12: Vitamin B-12: 556 pg/mL (ref 232–1245)

## 2022-11-26 LAB — HEMOGLOBIN A1C
Est. average glucose Bld gHb Est-mCnc: 192 mg/dL
Hgb A1c MFr Bld: 8.3 % — ABNORMAL HIGH (ref 4.8–5.6)

## 2022-11-27 ENCOUNTER — Encounter: Payer: Self-pay | Admitting: Student

## 2022-11-27 MED ORDER — METFORMIN HCL ER 500 MG PO TB24: 500.0000 mg | ORAL_TABLET | Freq: Every day | ORAL | 0 refills | Status: DC

## 2022-11-27 NOTE — Progress Notes (Signed)
   CC: Establish care   HPI:  Jade Lopez is a 27 y.o. F with PMH per below who presents to establish care with our clinic.    Recurrent endometritis/Tuboovarian abscess/Endometriosis/Pelvic floor Tension Myalgia  Patient had a hospitalization 2022 for endometritis as well as during 2023 and is s/p DNC x2. In 2023 she underwent hysteroscopy and was noted to have endometritis thought to be left over from infection in 2022. She also underwent diagnostic laparascopy and was found to have endometriosis and an abscess connected to her intestines. The exact reason fro her enometritis is unclear but patient did have have nexplanon in 2018 but this was removed 5-6 months after placement. She saw OBGYN in [place] and at [facility]. Last appointment with [facility] was 09/10/22. She was continued on norethindrone at that time. She does not have any complaints regarding pelvic floor or hip pain this clinic visit.    Unspecified autoimmune condition:  Per patient report. Patient states that for the last several years she has had recurrent fevers, intermittent myalgias/arthralgias, facial rash (involving the cheeks and nose, not sparing nasolabial folds). She has also had whole boy swelling as well. She has been evaluated by her PCP in [place] and has seen a rheumatologist. She states that her rheumatologist did not feel her ANA or other testing was suggestive of connective tissue disorder or lupus. She will request these records for [place] to review. She does not currently have any symptoms.   Diarrhea Patient states that this has been ongoing for about a year. She initially had about 20 stools per year but currently has about 5 stools per day. She underwent EGD and colonoscopy recently which were all unrevealing for etiology of her diarrhea. She was placed on an antidiarrheal. She states she has this medication but does not remember the name of it.   T2DM Patient has a history of T2DM but states she was  taken off of her antidiabetes medication.   Class 3 obesity She has participated in a weight loss program in the past where she was on mounjaro. Her BMI remains elevated.     Past Medical History:  Diagnosis Date   Asthma    Autoimmune disease ([place])    Class 3 obesity ([place])    Endometriosis    Endometritis    Fibromyalgia    Sleep apnea    T2DM (type 2 diabetes mellitus) ([place])    TOA (tubo-ovarian abscess)    Review of Systems:  Negative except per above.   Physical Exam:  Vitals:   11/25/22 1107  BP: 116/72  Pulse: 100  Resp: (!) 32  Temp: 98.2 F (36.8 C)  TempSrc: Oral  SpO2: 100%  Weight: (!) 405 lb 8 oz (183.9 kg)  Height: 5' 5.5" (1.664 m)    Constitutional: Well-developed, well-nourished, and in no distress.  Cardiovascular: Normal rate, regular rhythm, intact distal pulses. No gallop and no friction rub.  No murmur heard. No lower extremity edema  Pulmonary: Non labored breathing on room air, no wheezing or rales  Abdominal: Soft. Normal bowel sounds. Non distended and non tender Musculoskeletal: Normal range of motion.        General: No tenderness or edema.  Neurological: Alert and oriented to person, place, and time. Non focal  Skin: Skin is warm and dry.    Assessment & Plan:   See Encounters Tab for problem based charting.  Patient discussed with Dr. Heber [place]

## 2022-11-28 DIAGNOSIS — F419 Anxiety disorder, unspecified: Secondary | ICD-10-CM | POA: Insufficient documentation

## 2022-11-28 DIAGNOSIS — M359 Systemic involvement of connective tissue, unspecified: Secondary | ICD-10-CM | POA: Insufficient documentation

## 2022-11-28 DIAGNOSIS — N809 Endometriosis, unspecified: Secondary | ICD-10-CM | POA: Insufficient documentation

## 2022-11-28 DIAGNOSIS — R748 Abnormal levels of other serum enzymes: Secondary | ICD-10-CM | POA: Insufficient documentation

## 2022-11-28 DIAGNOSIS — R197 Diarrhea, unspecified: Secondary | ICD-10-CM | POA: Insufficient documentation

## 2022-11-28 DIAGNOSIS — M329 Systemic lupus erythematosus, unspecified: Secondary | ICD-10-CM | POA: Insufficient documentation

## 2022-11-28 DIAGNOSIS — E66813 Obesity, class 3: Secondary | ICD-10-CM | POA: Insufficient documentation

## 2022-11-28 DIAGNOSIS — E119 Type 2 diabetes mellitus without complications: Secondary | ICD-10-CM | POA: Insufficient documentation

## 2022-11-28 NOTE — Assessment & Plan Note (Signed)
Per patient report unspecified autoimmune disease potentially. Has been seen by rheumatology. Will request these records. Further work up pending those results.

## 2022-11-28 NOTE — Assessment & Plan Note (Signed)
Continue norethindrone. Refer to gyn in [place].

## 2022-11-28 NOTE — Assessment & Plan Note (Addendum)
Patient reports h/o anxiety. Previously on buspar.  Would like to resume this. Will have patient fill out PHQ9 and GAD7 Will discuss with her if she would be interested in seeing our behavioral health clinician Wyatt Portela.

## 2022-11-28 NOTE — Assessment & Plan Note (Signed)
This is isolated. She has h/o vit D deficiency. Patient will come back next week for TSH, GGT, and VIT D LABS.

## 2022-11-28 NOTE — Assessment & Plan Note (Signed)
Previously seen by weight loss clinic. Will discuss with patient if interested in seeing weight loss clinic here in [place]. Will attempt to start GLP1 within constraints of her insurance. May ultimately need referral to bariatric surgery.  Will also discuss whether she needs sleep study referral

## 2022-11-28 NOTE — Assessment & Plan Note (Addendum)
Patient may needed updated PFTs. Continue with albuterol as needed. Will consider transitioning to LABA/ICS at next clinic visit for maintenance and rescue inhaler.

## 2022-11-28 NOTE — Progress Notes (Signed)
Updated patient with results. Started her on metformin with plans to uptitrate or start additional agent depending on if she tolerates this. Patient coming back for w/u of isolated ALP elevation with ggt, tsh, vitd.

## 2022-11-28 NOTE — Assessment & Plan Note (Signed)
Patient has a history of this. She has an isolated ALP elevation. Which is likely related to this. Patient will come back in 1 week for these labs.

## 2022-11-28 NOTE — Assessment & Plan Note (Signed)
A1c 8.3 this clinic visit. Previously on metformin stopped this due to GI side effects. Also on mounjaro but for weight loss. Will start metformin for now and patient will return to clinic to see how she is tolerating a lower dose.

## 2022-11-28 NOTE — Assessment & Plan Note (Signed)
Unclear exact etiology of this. Patient reports that this has been extensively worked up recently. Will f/u on previous records before work up here.

## 2022-11-30 ENCOUNTER — Ambulatory Visit (INDEPENDENT_AMBULATORY_CARE_PROVIDER_SITE_OTHER): Payer: Medicaid Other | Admitting: Student

## 2022-11-30 ENCOUNTER — Encounter: Payer: Self-pay | Admitting: Student

## 2022-11-30 DIAGNOSIS — R748 Abnormal levels of other serum enzymes: Secondary | ICD-10-CM

## 2022-11-30 DIAGNOSIS — E119 Type 2 diabetes mellitus without complications: Secondary | ICD-10-CM

## 2022-11-30 NOTE — Progress Notes (Signed)
Lab only visit 

## 2022-12-01 ENCOUNTER — Other Ambulatory Visit: Payer: Self-pay | Admitting: Student

## 2022-12-01 DIAGNOSIS — E559 Vitamin D deficiency, unspecified: Secondary | ICD-10-CM

## 2022-12-01 LAB — MICROALBUMIN / CREATININE URINE RATIO
Creatinine, Urine: 198 mg/dL
Microalb/Creat Ratio: 3 mg/g creat (ref 0–29)
Microalbumin, Urine: 6.3 ug/mL

## 2022-12-01 LAB — LIPID PANEL
Chol/HDL Ratio: 3.3 ratio (ref 0.0–4.4)
Cholesterol, Total: 166 mg/dL (ref 100–199)
HDL: 51 mg/dL (ref >39)
LDL Chol Calc (NIH): 96 mg/dL (ref 0–99)
Triglycerides: 103 mg/dL (ref 0–149)
VLDL Cholesterol Cal: 19 mg/dL (ref 5–40)

## 2022-12-01 LAB — VITAMIN D 25 HYDROXY (VIT D DEFICIENCY, FRACTURES): Vit D, 25-Hydroxy: 11.3 ng/mL — ABNORMAL LOW (ref 30.0–100.0)

## 2022-12-01 LAB — TSH: TSH: 2.82 u[IU]/mL (ref 0.450–4.500)

## 2022-12-01 LAB — GAMMA GT: GGT: 24 IU/L (ref 0–60)

## 2022-12-01 MED ORDER — VITAMIN D (ERGOCALCIFEROL) 1.25 MG (50000 UNIT) PO CAPS: 50000.0000 [IU] | ORAL_CAPSULE | ORAL | 0 refills | Status: DC

## 2022-12-02 ENCOUNTER — Encounter: Payer: Medicaid Other | Admitting: Student

## 2022-12-02 NOTE — Progress Notes (Signed)
Internal Medicine Clinic Attending  Case discussed with the resident at the time of the visit.  We reviewed the resident's history and exam and pertinent patient test results.  I agree with the assessment, diagnosis, and plan of care documented in the resident's note.  

## 2022-12-02 NOTE — Progress Notes (Signed)
Discussed results with patient and started her on vitamin D supplementation.

## 2022-12-08 ENCOUNTER — Other Ambulatory Visit: Payer: Self-pay | Admitting: Student

## 2022-12-08 DIAGNOSIS — E119 Type 2 diabetes mellitus without complications: Secondary | ICD-10-CM

## 2022-12-08 MED ORDER — MM EASY TOUCH GLUCOSE METER W/DEVICE KIT: PACK | 0 refills | Status: AC

## 2022-12-08 MED ORDER — BLOOD GLUCOSE METER KIT: PACK | 0 refills | Status: DC

## 2022-12-14 ENCOUNTER — Encounter: Payer: Self-pay | Admitting: Student

## 2022-12-14 ENCOUNTER — Other Ambulatory Visit: Payer: Self-pay

## 2022-12-14 ENCOUNTER — Ambulatory Visit: Payer: Medicaid Other | Admitting: Student

## 2022-12-14 VITALS — BP 135/83 | HR 96 | Temp 98.3°F | Ht 64.5 in | Wt >= 6400 oz

## 2022-12-14 DIAGNOSIS — M359 Systemic involvement of connective tissue, unspecified: Secondary | ICD-10-CM

## 2022-12-14 DIAGNOSIS — N809 Endometriosis, unspecified: Secondary | ICD-10-CM | POA: Diagnosis not present

## 2022-12-14 DIAGNOSIS — R300 Dysuria: Secondary | ICD-10-CM | POA: Diagnosis present

## 2022-12-14 DIAGNOSIS — N921 Excessive and frequent menstruation with irregular cycle: Secondary | ICD-10-CM

## 2022-12-14 LAB — POCT URINALYSIS DIPSTICK
Bilirubin, UA: NEGATIVE
Ketones, UA: NEGATIVE
Nitrite, UA: NEGATIVE
Protein, UA: NEGATIVE
Spec Grav, UA: 1.02 (ref 1.010–1.025)
Urobilinogen, UA: 0.2 E.U./dL
pH, UA: 7.5 (ref 5.0–8.0)

## 2022-12-14 NOTE — Patient Instructions (Addendum)
Jade Lopez has been upped today. Please use ibuprofen to help with your symptoms as well.   If your fever is above 100.78F please call our office you may need to be seen in the ED.  Please record your fevers in a chart and let us know if you have any symptoms, including any chills, night sweats.   Please call the following number below to schedule an appointment with gynecology:  [facility] for [facility] in [place], [place] Address: [address] [place], [place] [address] Phone: [phone]

## 2022-12-15 ENCOUNTER — Other Ambulatory Visit (INDEPENDENT_AMBULATORY_CARE_PROVIDER_SITE_OTHER): Payer: Medicaid Other

## 2022-12-15 DIAGNOSIS — M359 Systemic involvement of connective tissue, unspecified: Secondary | ICD-10-CM | POA: Diagnosis present

## 2022-12-15 DIAGNOSIS — N921 Excessive and frequent menstruation with irregular cycle: Secondary | ICD-10-CM

## 2022-12-15 LAB — URINALYSIS, ROUTINE W REFLEX MICROSCOPIC
Bilirubin, UA: NEGATIVE
Glucose, UA: NEGATIVE
Ketones, UA: NEGATIVE
Leukocytes,UA: NEGATIVE
Nitrite, UA: NEGATIVE
Protein,UA: NEGATIVE
RBC, UA: NEGATIVE
Specific Gravity, UA: 1.016 (ref 1.005–1.030)
Urobilinogen, Ur: 0.2 mg/dL (ref 0.2–1.0)
pH, UA: 7.5 (ref 5.0–7.5)

## 2022-12-16 ENCOUNTER — Ambulatory Visit (INDEPENDENT_AMBULATORY_CARE_PROVIDER_SITE_OTHER): Payer: Medicaid Other | Admitting: Licensed Clinical Social Worker

## 2022-12-16 DIAGNOSIS — F419 Anxiety disorder, unspecified: Secondary | ICD-10-CM

## 2022-12-16 NOTE — Assessment & Plan Note (Signed)
Patient has possible unspecified autoimmune disease. She states that she has had intermittent fevers for many years. She has also had positive ANA tests in the past. 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 She did see rheumatology in [place], but we are unable to access these records. She is not currently on therapy for this.  -Will repeat ANA, antidsDNA, crp, and ESR -Pending results will refer to rheumatology  ESR elevated, will follow up other labs as they become available

## 2022-12-16 NOTE — Assessment & Plan Note (Addendum)
Patient's vaginal bleeding and abdominal pain likely related to her endometriosis. Patient states that her GYN in [place] increased her orilissa to 150mg  BID. This was only on the day of her appointment. She also notes that her vaginal bleeding has improved.  -Patient will continue orilissa and as needed ibuprofen for pain -She is on norethindrone and depo provera for her vaginal bleeding, patient is already established with GYN at [facility], she will attempt to get an appointment there as well as with GYN in [place] to see what else should be done to help her symptoms.   CBC    Component Value Date/Time   WBC 8.0 12/15/2022 1345   WBC 10.0 10/06/2020 1645   RBC 4.95 12/15/2022 1345   RBC 5.42 (H) 10/06/2020 1645   HGB 12.7 12/15/2022 1345   HCT 40.0 12/15/2022 1345   PLT 307 12/15/2022 1345   MCV 81 12/15/2022 1345   MCH 25.7 (L) 12/15/2022 1345   MCH 24.9 (L) 10/06/2020 1645   MCHC 31.8 12/15/2022 1345   MCHC 29.7 (L) 10/06/2020 1645   RDW 13.8 12/15/2022 1345   LYMPHSABS 2.8 10/06/2020 1645   MONOABS 0.6 10/06/2020 1645   EOSABS 0.2 10/06/2020 1645   BASOSABS 0.1 10/06/2020 1645   Patient has no anemia with some mild iron deficiency. Will start oral iron supplementation for the patient.

## 2022-12-16 NOTE — BH Specialist Note (Signed)
Integrated Behavioral Health Initial In-Person Visit  MRN: [id number] Name: Jade Lopez  Number of [place] Clinician visits: No data recorded Session Start time: 1191    Session End time: 1600  Total time in minutes: 30   Types of Service: Individual psychotherapy and Telephone visit     Warm Hand Off Completed.        Subjective: Jade Lopez is a 27 y.o. female  Patient was referred by PCP Dr. Eulas Post for Depression/ Anxiety. Patient reports the following symptoms/concerns: Feeling sad, Depressed and anxiety Duration of problem: ongoing for years; Severity of problem: moderate  Objective: Mood:  Appropriate  and Affect: Appropriate Risk of harm to self or others: No plan to harm self or others     Goals Addressed: Patient will: Reduce symptoms of: anxiety and depression  Progress towards Goals: Ongoing  Interventions: Interventions utilized: Supportive Counseling  Standardized Assessments completed: PHQ-SADS     12/14/2022    2:09 PM  PHQ-SADS Last 3 Score only  PHQ Adolescent Score 17     Assessment: Patient currently experiencing Since graduating college, patient reports being bored and unemployed. Patient reports being depressed and in physical pain. [facility] inquired on patients interest. Patient enjoys swimming in pool, painting, music and her emotional support animal " Biscuit".    Patient may benefit from Supportive counseling .  Plan: Follow up with behavioral health clinician on : 02/22 at 1:30 Pm Behavioral recommendations: Swimming and spending time with emotional support animal.  Milus Height, MSW, [place]  Fax [phone] Main Office Phone: [phone] [address] [place], [place] [address] Website: [place], [place]

## 2022-12-16 NOTE — Progress Notes (Signed)
   CC: vaginal bleeding  HPI:  [name] is a 27 y.o. F with PMH per below who presents with 3 d of heavy vaginal bleeding. Patient states that this has happened to her in the past but she was placed on norethindrone as well as depo provera injections to help prevent menorrhagia. She has noticed over the past 3 days that she has soaked through about 6 pads per day. She has also had to use a chux pad to help prevent bleeding onto her sheets. The last time she changed her pad on the day of clinic she states that she noticed the bleeding had approved. She states that during this time she has also had some lightheadedness and some abdominal pain, that preceded her bleeding. She states that she had RLQ abdominal pain that eventually included her suprapubic region. She has had some orange appearing discharge which she notes she has had before. She denies any burning with urination or dysuria. She notes recurrent fevers for many years and no chills.   Past Medical History:  Diagnosis Date   Asthma    Autoimmune disease ([place])    Class 3 obesity ([place])    Endometriosis    Endometritis    Fibromyalgia    Sleep apnea    T2DM (type 2 diabetes mellitus) (Jade Lopez)    TOA (tubo-ovarian abscess)    Review of Systems: Negative except per above.   Physical Exam:  Vitals:   12/14/22 1402  BP: 135/83  Pulse: 96  Temp: 98.3 F (36.8 C)  TempSrc: Oral  SpO2: 100%  Weight: (!) 401 lb 3.2 oz (182 kg)  Height: 5' 4.5" (1.638 m)   Constitutional: Well-developed, well-nourished, and in no distress.  Cardiovascular: Normal rate, regular rhythm, intact distal pulses. No gallop and no friction rub.  No murmur heard. No lower extremity edema  Pulmonary: Non labored breathing on room air, no wheezing or rales  Abdominal: Soft. Normal bowel sounds. Non distended and non tender Musculoskeletal: Normal range of motion.        General: No tenderness or edema.  Neurological: Alert and oriented to person,  place, and time. Non focal  Skin: Skin is warm and dry.    Assessment & Plan:   See Encounters Tab for problem based charting.  Patient discussed with Dr. Jimmye Norman

## 2022-12-18 LAB — CBC
Hematocrit: 40 % (ref 34.0–46.6)
Hemoglobin: 12.7 g/dL (ref 11.1–15.9)
MCH: 25.7 pg — ABNORMAL LOW (ref 26.6–33.0)
MCHC: 31.8 g/dL (ref 31.5–35.7)
MCV: 81 fL (ref 79–97)
Platelets: 307 10*3/uL (ref 150–450)
RBC: 4.95 x10E6/uL (ref 3.77–5.28)
RDW: 13.8 % (ref 11.7–15.4)
WBC: 8 10*3/uL (ref 3.4–10.8)

## 2022-12-18 LAB — IRON,TIBC AND FERRITIN PANEL
Ferritin: 123 ng/mL (ref 15–150)
Iron Saturation: 16 % (ref 15–55)
Iron: 45 ug/dL (ref 27–159)
Total Iron Binding Capacity: 279 ug/dL (ref 250–450)
UIBC: 234 ug/dL (ref 131–425)

## 2022-12-18 LAB — ANTI-DNA ANTIBODY, DOUBLE-STRANDED: dsDNA Ab: <1 IU/mL (ref 0–9)

## 2022-12-18 LAB — C-REACTIVE PROTEIN: CRP: 40 mg/L — ABNORMAL HIGH (ref 0–10)

## 2022-12-18 LAB — ANTINUCLEAR ANTIBODIES, IFA: ANA Titer 1: NEGATIVE

## 2022-12-18 LAB — SEDIMENTATION RATE: Sed Rate: 48 mm/hr — ABNORMAL HIGH (ref 0–32)

## 2022-12-21 ENCOUNTER — Telehealth: Payer: Self-pay | Admitting: Student

## 2022-12-22 ENCOUNTER — Telehealth: Payer: Self-pay | Admitting: Student

## 2022-12-22 ENCOUNTER — Telehealth: Payer: Self-pay | Admitting: *Deleted

## 2022-12-22 ENCOUNTER — Telehealth: Payer: Self-pay

## 2022-12-22 ENCOUNTER — Other Ambulatory Visit: Payer: Self-pay | Admitting: Student

## 2022-12-22 DIAGNOSIS — G4733 Obstructive sleep apnea (adult) (pediatric): Secondary | ICD-10-CM

## 2022-12-22 DIAGNOSIS — E119 Type 2 diabetes mellitus without complications: Secondary | ICD-10-CM

## 2022-12-22 MED ORDER — MOUNJARO 2.5 MG/0.5ML ~~LOC~~ SOAJ: SUBCUTANEOUS | 3 refills | Status: DC

## 2022-12-22 MED ORDER — EMPAGLIFLOZIN 10 MG PO TABS: 10.0000 mg | ORAL_TABLET | Freq: Every day | ORAL | 3 refills | Status: DC

## 2022-12-22 NOTE — Telephone Encounter (Signed)
Patient was trialed on metformin again. In the past she has had GI inolerance and lightheadedness with its use and notes these symptoms with low dose metformin currently precluding its continued use. Will send prescription for mounjaro and jardiance.

## 2022-12-22 NOTE — Telephone Encounter (Signed)
Rtn pt's call in reference to the Weight Management Referral and provided the pt the number listed below.  The pt's also states a Referral was to be sent for a Consult to the [facility]. Pt states she had a visit on 11/25/2022 and she has not heard from their office as well  Can a Referral be placed for the Sleep Center?  [facility] at Weight loss service in [place], [place] Address: [address], [place], [place] [address] Phone: [phone]

## 2022-12-22 NOTE — Telephone Encounter (Signed)
Patient called regarding her referral to weight management and sleep study patient stated she has not received a call about either referral advised patient I will have Chilon call her back.

## 2022-12-23 ENCOUNTER — Telehealth: Payer: Self-pay

## 2022-12-23 ENCOUNTER — Other Ambulatory Visit: Payer: Self-pay

## 2022-12-23 DIAGNOSIS — E119 Type 2 diabetes mellitus without complications: Secondary | ICD-10-CM

## 2022-12-23 MED ORDER — MOUNJARO 2.5 MG/0.5ML ~~LOC~~ SOAJ: 2.5000 mg | SUBCUTANEOUS | 0 refills | Status: DC

## 2022-12-23 NOTE — Telephone Encounter (Signed)
Prior Authorization for patient Jade Lopez) came through on cover my meds was submitted with last office notes awaiting approval or denial

## 2022-12-23 NOTE — Telephone Encounter (Signed)
Incoming fax from pharmacy Message to prescriber:Mounjaro is notlike ozempic 2.86m gives dose of 2.5, 52mcan't double dose weekly. Need different rx for 5 mg dose.

## 2022-12-24 NOTE — Telephone Encounter (Signed)
[place] (Key: [id number]) PA Case ID #: [id number] Need Help? Call us at [phone] Outcome Denied on February 14 Request Reference Number: [id number]. MOUNJARO INJ 2.5/0.5 is denied for not meeting the prior authorization requirement(s). For further questions, call [place] at [phone] for more information. Drug Mounjaro 2.5MG/0.5ML pen-injectors ePA cloud logo Form [employer] Electronic Prior Authorization Form [phone] NCPDP)

## 2022-12-24 NOTE — Progress Notes (Signed)
Internal Medicine Clinic Attending  Case discussed with Dr. Carter  At the time of the visit.  We reviewed the resident's history and exam and pertinent patient test results.  I agree with the assessment, diagnosis, and plan of care documented in the resident's note.  

## 2022-12-24 NOTE — Telephone Encounter (Signed)
Discussed patient's lab results with her. She has a negative ANA and antidsDNA. She did have mildly elevated CRP and ESR. She has no leukocytosis. She has no evidence of UTI. And there is no concern for respiratory infection.   Discussed with patient at her next clinic visit we will repeat her inflammatory markers, she will also bring a journal documenting her fevers. At that time we will decide whether or not to send her to rheum.

## 2022-12-25 ENCOUNTER — Emergency Department (HOSPITAL_COMMUNITY)
Admission: EM | Admit: 2022-12-25 | Discharge: 2022-12-25 | Disposition: A | Discharge status: Home / Self Care | Payer: Medicaid Other | Attending: Emergency Medicine | Admitting: Emergency Medicine

## 2022-12-25 ENCOUNTER — Other Ambulatory Visit: Payer: Self-pay

## 2022-12-25 ENCOUNTER — Encounter (HOSPITAL_COMMUNITY): Payer: Self-pay

## 2022-12-25 DIAGNOSIS — G43801 Other migraine, not intractable, with status migrainosus: Secondary | ICD-10-CM | POA: Diagnosis not present

## 2022-12-25 DIAGNOSIS — R519 Headache, unspecified: Secondary | ICD-10-CM | POA: Diagnosis present

## 2022-12-25 MED ORDER — PROCHLORPERAZINE EDISYLATE 10 MG/2ML IJ SOLN
10.0000 mg | INTRAMUSCULAR | Status: DC
  Filled 2022-12-25: qty 2

## 2022-12-25 MED ORDER — PROCHLORPERAZINE EDISYLATE 10 MG/2ML IJ SOLN
10.0000 mg | INTRAMUSCULAR | Status: AC
  Administered 2022-12-25: 10 mg via INTRAMUSCULAR

## 2022-12-25 NOTE — ED Notes (Signed)
Pt alert and ambulatory with steady gait

## 2022-12-25 NOTE — ED Triage Notes (Signed)
Reports hx of migraines.  Reports this one came on yesterday.  +nausea

## 2022-12-25 NOTE — ED Provider Notes (Signed)
[facility] Provider Note   CSN: [id number] Arrival date & time: 12/25/22  1000     History  Chief Complaint  Patient presents with   Migraine    Jade Lopez is a 27 y.o. female.  HPI   64-year-old female history of migraine headaches presents today complaining of headache consistent with prior migraines.  She states this is one of her more severe headaches.  However she has had 1 this bad in the past.  It starts on the left side but is all over.  No fever no focal neurological findings, no vomiting.  She has taken her home medications without relief.  Home Medications Prior to Admission medications   Medication Sig Start Date End Date Taking? Authorizing Provider  albuterol (ACCUNEB) 1.25 MG/3ML nebulizer solution Take 1 ampule by nebulization every 6 (six) hours as needed for wheezing.    [provider]  albuterol (VENTOLIN HFA) 108 (90 Base) MCG/ACT inhaler Inhale into the lungs every 6 (six) hours as needed for wheezing or shortness of breath.    [provider]  Blood Glucose Monitoring Suppl (MM EASY TOUCH GLUCOSE METER) w/Device KIT Please check your blood sugar daily, in the morning. 12/08/22   Rick Duff, MD  busPIRone (BUSPAR) 10 MG tablet Take 3 tablets (30 mg total) by mouth daily. 11/25/22   Rick Duff, MD  empagliflozin (JARDIANCE) 10 MG TABS tablet Take 1 tablet (10 mg total) by mouth daily. 12/22/22   Rick Duff, MD  medroxyPROGESTERone (DEPO-PROVERA) 150 MG/ML injection Inject 1 mL every 3 months by intramuscular route.    [provider]  metFORMIN (GLUCOPHAGE-XR) 500 MG 24 hr tablet Take 1 tablet (500 mg total) by mouth daily with breakfast. 11/27/22   Rick Duff, MD  norethindrone (AYGESTIN) 5 MG tablet Take 5 mg by mouth daily. 12/11/22   [provider]  ORILISSA 150 MG TABS Take 1 tablet by mouth daily. 12/12/22   [provider]  pregabalin (LYRICA)  50 MG capsule Take 1 capsule (50 mg total) by mouth at bedtime. 11/25/22   Rick Duff, MD  tirzepatide [facility]) 2.5 MG/0.5ML Pen Inject 2.5 mg into the skin once a week. 12/23/22 01/22/23  Rick Duff, MD  Vitamin D, Ergocalciferol, (DRISDOL) 1.25 MG (50000 UNIT) CAPS capsule Take 1 capsule (50,000 Units total) by mouth every 7 (seven) days. 12/01/22   Rick Duff, MD      Allergies    Clindamycin/lincomycin, Other, Chlorpheniramine-phenylephrine, and Pregabalin    Review of Systems   Review of Systems  Physical Exam Updated Vital Signs BP 115/85   Pulse 84   Temp 99.5 F (37.5 C) (Oral)   Resp 18   Ht 1.638 m (5' 4.5")   Wt (!) 181.9 kg   SpO2 100%   BMI 67.77 kg/m  Physical Exam Vitals reviewed.  Constitutional:      Appearance: She is obese.  HENT:     Head: Normocephalic.     Right Ear: External ear normal.     Left Ear: External ear normal.     Nose: Nose normal.     Mouth/Throat:     Pharynx: Oropharynx is clear.  Eyes:     Extraocular Movements: Extraocular movements intact.     Pupils: Pupils are equal, round, and reactive to light.  Cardiovascular:     Rate and Rhythm: Normal rate.     Pulses: Normal pulses.  Pulmonary:     Effort: Pulmonary effort is  normal.  Abdominal:     General: Bowel sounds are normal.     Palpations: Abdomen is soft.  Musculoskeletal:        General: Normal range of motion.     Cervical back: Normal range of motion.  Skin:    General: Skin is warm and dry.     Capillary Refill: Capillary refill takes less than 2 seconds.  Neurological:     General: No focal deficit present.     Mental Status: She is alert.     Cranial Nerves: No cranial nerve deficit.     Sensory: No sensory deficit.     Motor: No weakness.     Coordination: Coordination normal.     Deep Tendon Reflexes: Reflexes normal.  Psychiatric:        Mood and Affect: Mood normal.        Behavior: Behavior normal.     ED Results / Procedures /  Treatments   Labs (all labs ordered are listed, but only abnormal results are displayed) Labs Reviewed - No data to display  EKG None  Radiology No results found.  Procedures Procedures    Medications Ordered in ED Medications  prochlorperazine (COMPAZINE) injection 10 mg (10 mg Intramuscular Given 12/25/22 1204)    ED Course/ Medical Decision Making/ A&P                             Medical Decision Making Risk Prescription drug management.   Has history of migraine headaches presents today complaining of symptoms consider visit with her migraine headaches.  Home medications have not been working. She is given Compazine IM here and has improvement of her headache decreased from 9 out of 10 to 3 out of 10. Patient appears stable for discharge she will resume her home medications.        Final Clinical Impression(s) / ED Diagnoses Final diagnoses:  Other migraine with status migrainosus, not intractable    Rx / DC Orders ED Discharge Orders     None         Pattricia Boss, MD 12/25/22 1323

## 2022-12-28 ENCOUNTER — Encounter: Payer: Self-pay | Admitting: Internal Medicine

## 2022-12-28 ENCOUNTER — Ambulatory Visit (INDEPENDENT_AMBULATORY_CARE_PROVIDER_SITE_OTHER): Payer: Medicaid Other

## 2022-12-28 ENCOUNTER — Telehealth: Payer: Self-pay

## 2022-12-28 DIAGNOSIS — Z3042 Encounter for surveillance of injectable contraceptive: Secondary | ICD-10-CM

## 2022-12-28 DIAGNOSIS — N921 Excessive and frequent menstruation with irregular cycle: Secondary | ICD-10-CM

## 2022-12-28 MED ORDER — MEDROXYPROGESTERONE ACETATE 104 MG/0.65ML ~~LOC~~ SUSY
104.0000 mg | PREFILLED_SYRINGE | Freq: Once | SUBCUTANEOUS | Status: AC
  Administered 2022-12-28: 104 mg via SUBCUTANEOUS

## 2022-12-28 NOTE — Telephone Encounter (Signed)
Incoming fax from pharmacy Message to prescriber : Jade Lopez is given 1 pen 1 dose/week, a box of 4 pen is for 1 month, can't double dose of 2.28m to be 5 mg. Please fix prescription

## 2022-12-30 ENCOUNTER — Telehealth: Payer: Self-pay | Admitting: *Deleted

## 2022-12-30 ENCOUNTER — Telehealth: Payer: Self-pay

## 2022-12-30 NOTE — Telephone Encounter (Signed)
Patient called in c/o right breast lump x 3-4 days. States lump is behind nipple and is visible as well as palpable. Lump is painful ("shooting"); rates 5-6/10 at present. It is also warm to the touch. Notes light green drainage from nipple. She had a fever on 12/26/22 and continues with chills. Has never had a mammogram. Advised patient to head directly to ED as if this is an infection we do not want it getting into blood stream. States she will call UC to see if they can treat this. If not, she will go to ED.

## 2022-12-30 NOTE — Telephone Encounter (Signed)
Pt is requesting a call back .Marland Kitchen She stated that she has a breast mass ( right )  under her nipple and it is warm  to the touch ...  .. Pt has an appt tomorrow in person with bianca 1/22 was transferred to RN Lauren

## 2022-12-30 NOTE — Telephone Encounter (Signed)
Agree with urgent care/ED evaluation. Thank you.

## 2022-12-31 ENCOUNTER — Ambulatory Visit (INDEPENDENT_AMBULATORY_CARE_PROVIDER_SITE_OTHER): Payer: Medicaid Other | Admitting: Licensed Clinical Social Worker

## 2022-12-31 ENCOUNTER — Other Ambulatory Visit: Payer: Self-pay | Admitting: Internal Medicine

## 2022-12-31 DIAGNOSIS — E119 Type 2 diabetes mellitus without complications: Secondary | ICD-10-CM

## 2022-12-31 DIAGNOSIS — F419 Anxiety disorder, unspecified: Secondary | ICD-10-CM

## 2022-12-31 NOTE — BH Specialist Note (Signed)
Integrated Behavioral Health Follow Up In-Person Visit  MRN: [id number] Name: Jade Lopez  Number of [place] Clinician visits: 2- Second Visit  Session Start time: [id number]   Session End time: [id number]  Total time in minutes: 60   Types of Service: Individual psychotherapy and General Behavioral Integrated Care (BHI)  Interpretor:No.   Subjective: Jade Lopez is a 27 y.o. female  Patient was referred by PCP for Depression/ Anxiety. Patient reports the following symptoms/concerns: Feeling sad, bored Duration of problem: years; Severity of problem: severe  Objective: Mood: Depressed and Affect: Tearful Risk of harm to self or others: No plan to harm self or others   Patient and/or Family's Strengths/Protective Factors: Concrete supports in place (healthy food, safe environments, etc.)  Goals Addressed: Patient will:  Reduce symptoms of: anxiety and depression    Progress towards Goals: Ongoing  Interventions: Interventions utilized:  Motivational Interviewing and Mindfulness or Relaxation Training    Assessment: Patient currently experiencing Depression and anxiety. Patient reports living with her sister and husband. The dynamics of the household is adding to her depression. [facility] and patient discussed patients morning routine. Patient admitted to spending majority of her time in bed. [facility] and patient created a morning schedule/routine to encourage patient to spend less time in bed. [facility] and patient discussed applying for government jobs and [employer].  [facility] and patient discussed relaxation techniques.   Patient may benefit from Ongoing counseling.  Plan: Follow up with behavioral health clinician on : 03/07 In person visit at 1:30 pm  Milus Height, MSW, [place]  Fax [phone] Main Office Phone: [phone] [address] [place], [place]  [address] Website: [place], [place]

## 2022-12-31 NOTE — Progress Notes (Signed)
[facility] successfully contacted patient via telephone due to patient not arriving to scheduled appointment ton today at 1:30pm.  Patient advised she was in traffic and on her way.   Milus Height, MSW, [place]  Internal Medicine Center Direct Dial:[phone]  Fax [phone] Main Office Phone: [phone] [address] [place], [place] [address] Website: [place], [place]

## 2023-01-01 ENCOUNTER — Encounter: Payer: Medicaid Other | Admitting: Student

## 2023-01-02 MED ORDER — MOUNJARO 2.5 MG/0.5ML ~~LOC~~ SOAJ: 2.5000 mg | SUBCUTANEOUS | 2 refills | Status: DC

## 2023-01-02 NOTE — Progress Notes (Signed)
Mounjaro 2.5 mg sent to pharmacy with 2 refills.

## 2023-01-04 ENCOUNTER — Telehealth: Payer: Self-pay | Admitting: Family Medicine

## 2023-01-04 ENCOUNTER — Encounter (INDEPENDENT_AMBULATORY_CARE_PROVIDER_SITE_OTHER): Payer: Medicaid Other | Admitting: Family Medicine

## 2023-01-04 NOTE — Telephone Encounter (Signed)
Patient has endometriosis and is in a lot of pain would like a nurse to call back.

## 2023-01-05 ENCOUNTER — Encounter: Payer: Self-pay | Admitting: Obstetrics and Gynecology

## 2023-01-06 ENCOUNTER — Other Ambulatory Visit: Payer: Self-pay

## 2023-01-06 ENCOUNTER — Emergency Department (HOSPITAL_COMMUNITY): Payer: Medicaid Other

## 2023-01-06 ENCOUNTER — Encounter: Payer: Medicaid Other | Admitting: Student

## 2023-01-06 ENCOUNTER — Emergency Department (HOSPITAL_COMMUNITY)
Admission: EM | Admit: 2023-01-06 | Discharge: 2023-01-06 | Disposition: A | Discharge status: Home / Self Care | Payer: Medicaid Other | Attending: Emergency Medicine | Admitting: Emergency Medicine

## 2023-01-06 DIAGNOSIS — E119 Type 2 diabetes mellitus without complications: Secondary | ICD-10-CM | POA: Insufficient documentation

## 2023-01-06 DIAGNOSIS — R1031 Right lower quadrant pain: Secondary | ICD-10-CM

## 2023-01-06 DIAGNOSIS — Z7984 Long term (current) use of oral hypoglycemic drugs: Secondary | ICD-10-CM | POA: Diagnosis not present

## 2023-01-06 DIAGNOSIS — J45909 Unspecified asthma, uncomplicated: Secondary | ICD-10-CM | POA: Insufficient documentation

## 2023-01-06 LAB — COMPREHENSIVE METABOLIC PANEL
ALT: 11 U/L (ref 0–44)
AST: 13 U/L — ABNORMAL LOW (ref 15–41)
Albumin: 3.2 g/dL — ABNORMAL LOW (ref 3.5–5.0)
Alkaline Phosphatase: 91 U/L (ref 38–126)
Anion gap: 9 (ref 5–15)
BUN: 7 mg/dL (ref 6–20)
CO2: 23 mmol/L (ref 22–32)
Calcium: 8.9 mg/dL (ref 8.9–10.3)
Chloride: 105 mmol/L (ref 98–111)
Creatinine, Ser: 0.7 mg/dL (ref 0.44–1.00)
GFR, Estimated: >60 mL/min (ref >60)
Glucose, Bld: 135 mg/dL — ABNORMAL HIGH (ref 70–99)
Potassium: 4 mmol/L (ref 3.5–5.1)
Sodium: 137 mmol/L (ref 135–145)
Total Bilirubin: 0.5 mg/dL (ref 0.3–1.2)
Total Protein: 7 g/dL (ref 6.5–8.1)

## 2023-01-06 LAB — I-STAT BETA HCG BLOOD, ED (MC, WL, AP ONLY): I-stat hCG, quantitative: <5 m[IU]/mL (ref <5)

## 2023-01-06 LAB — URINALYSIS, ROUTINE W REFLEX MICROSCOPIC
Bilirubin Urine: NEGATIVE
Glucose, UA: NEGATIVE mg/dL
Hgb urine dipstick: NEGATIVE
Ketones, ur: NEGATIVE mg/dL
Leukocytes,Ua: NEGATIVE
Nitrite: NEGATIVE
Protein, ur: NEGATIVE mg/dL
Specific Gravity, Urine: 1.025 (ref 1.005–1.030)
pH: 6 (ref 5.0–8.0)

## 2023-01-06 LAB — CBC
HCT: 42.3 % (ref 36.0–46.0)
Hemoglobin: 12.9 g/dL (ref 12.0–15.0)
MCH: 25.6 pg — ABNORMAL LOW (ref 26.0–34.0)
MCHC: 30.5 g/dL (ref 30.0–36.0)
MCV: 84.1 fL (ref 80.0–100.0)
Platelets: 286 10*3/uL (ref 150–400)
RBC: 5.03 MIL/uL (ref 3.87–5.11)
RDW: 14.6 % (ref 11.5–15.5)
WBC: 9.8 10*3/uL (ref 4.0–10.5)
nRBC: 0 % (ref 0.0–0.2)

## 2023-01-06 LAB — LIPASE, BLOOD: Lipase: 36 U/L (ref 11–51)

## 2023-01-06 MED ORDER — IBUPROFEN 800 MG PO TABS: 800.0000 mg | ORAL_TABLET | Freq: Three times a day (TID) | ORAL | 0 refills | Status: DC

## 2023-01-06 MED ORDER — IOHEXOL 350 MG/ML SOLN
75.0000 mL | Freq: Once | INTRAVENOUS | Status: AC | PRN
  Administered 2023-01-06: 75 mL via INTRAVENOUS

## 2023-01-06 MED ORDER — KETOROLAC TROMETHAMINE 15 MG/ML IJ SOLN
15.0000 mg | Freq: Once | INTRAMUSCULAR | Status: AC
  Administered 2023-01-06: 15 mg via INTRAVENOUS
  Filled 2023-01-06: qty 1

## 2023-01-06 MED ORDER — HYDROCODONE-ACETAMINOPHEN 5-325 MG PO TABS
1.0000 | ORAL_TABLET | Freq: Once | ORAL | Status: AC
  Administered 2023-01-06: 1 via ORAL
  Filled 2023-01-06: qty 1

## 2023-01-06 NOTE — ED Notes (Signed)
IV team at bedside 

## 2023-01-06 NOTE — ED Provider Notes (Signed)
[facility] Provider Note   CSN: [id number] Arrival date & time: 01/06/23  [id number]     History  Chief Complaint  Patient presents with   Abdominal Pain   Generalized Body Aches    Jade Lopez is a 27 y.o. female.   Abdominal Pain   27 year old female presents emergency department with complaints of generalized bodyaches, right lower abdominal pain.  Patient states that symptoms been present for the past week and a half.  Reports taking at home gabapentin as well as ibuprofen with some relief of symptoms initially.  Patient with stated history of endometriosis with prior laparoscopic procedure performed to remove dead tissue.  States that current presentation feels similar to other flareups.  States she is only currently Depo injection as well as oral contraceptives for said condition of which she states is not helping.  Denies fever, chills, night sweats, chest pain, shortness of breath, nausea, vomiting, urinary/vaginal symptoms, change in bowel habits.  Past medical history significant for obesity, diabetes, and endometriosis, asthma  Home Medications Prior to Admission medications   Medication Sig Start Date End Date Taking? Authorizing Provider  ibuprofen (ADVIL) 800 MG tablet Take 1 tablet (800 mg total) by mouth 3 (three) times daily. 01/06/23  Yes Dion Saucier A, PA  albuterol (ACCUNEB) 1.25 MG/3ML nebulizer solution Take 1 ampule by nebulization every 6 (six) hours as needed for wheezing.    [provider]  albuterol (VENTOLIN HFA) 108 (90 Base) MCG/ACT inhaler Inhale into the lungs every 6 (six) hours as needed for wheezing or shortness of breath.    [provider]  Blood Glucose Monitoring Suppl (MM EASY TOUCH GLUCOSE METER) w/Device KIT Please check your blood sugar daily, in the morning. 12/08/22   Rick Duff, MD  busPIRone (BUSPAR) 10 MG tablet Take 3 tablets (30 mg total) by mouth daily. 11/25/22    Rick Duff, MD  empagliflozin (JARDIANCE) 10 MG TABS tablet Take 1 tablet (10 mg total) by mouth daily. 12/22/22   Rick Duff, MD  medroxyPROGESTERone (DEPO-PROVERA) 150 MG/ML injection Inject 1 mL every 3 months by intramuscular route.    [provider]  metFORMIN (GLUCOPHAGE-XR) 500 MG 24 hr tablet Take 1 tablet (500 mg total) by mouth daily with breakfast. 11/27/22   Rick Duff, MD  norethindrone (AYGESTIN) 5 MG tablet Take 5 mg by mouth daily. 12/11/22   [provider]  ORILISSA 150 MG TABS Take 1 tablet by mouth daily. 12/12/22   [provider]  pregabalin (LYRICA) 50 MG capsule Take 1 capsule (50 mg total) by mouth at bedtime. 11/25/22   Rick Duff, MD  tirzepatide [facility]) 2.5 MG/0.5ML Pen Inject 2.5 mg into the skin once a week. 01/02/23   Masters, Katie, DO  Vitamin D, Ergocalciferol, (DRISDOL) 1.25 MG (50000 UNIT) CAPS capsule Take 1 capsule (50,000 Units total) by mouth every 7 (seven) days. 12/01/22   Rick Duff, MD      Allergies    Clindamycin/lincomycin, Other, Chlorpheniramine-phenylephrine, and Pregabalin    Review of Systems   Review of Systems  Gastrointestinal:  Positive for abdominal pain.  All other systems reviewed and are negative.   Physical Exam Updated Vital Signs BP 121/79   Pulse 97   Temp 98.8 F (37.1 C) (Oral)   Resp 16   Ht 5' 4.5" (1.638 m)   Wt (!) 183.3 kg   SpO2 100%   BMI 68.28 kg/m  Physical Exam Vitals and nursing note reviewed.  Constitutional:      General: She is not in acute distress.    Appearance: She is well-developed.  HENT:     Head: Normocephalic and atraumatic.  Eyes:     Conjunctiva/sclera: Conjunctivae normal.  Cardiovascular:     Rate and Rhythm: Normal rate and regular rhythm.     Heart sounds: No murmur heard. Pulmonary:     Effort: Pulmonary effort is normal. No respiratory distress.     Breath sounds: Normal breath sounds.  Abdominal:     Palpations:  Abdomen is soft.     Tenderness: There is abdominal tenderness in the right lower quadrant. There is no right CVA tenderness, left CVA tenderness, guarding or rebound.  Genitourinary:    Comments: Patient declined pelvic exam at this time. Musculoskeletal:        General: No swelling.     Cervical back: Neck supple.  Skin:    General: Skin is warm and dry.     Capillary Refill: Capillary refill takes less than 2 seconds.  Neurological:     Mental Status: She is alert.  Psychiatric:        Mood and Affect: Mood normal.     ED Results / Procedures / Treatments   Labs (all labs ordered are listed, but only abnormal results are displayed) Labs Reviewed  COMPREHENSIVE METABOLIC PANEL - Abnormal; Notable for the following components:      Result Value   Glucose, Bld 135 (*)    Albumin 3.2 (*)    AST 13 (*)    All other components within normal limits  CBC - Abnormal; Notable for the following components:   MCH 25.6 (*)    All other components within normal limits  RESP PANEL BY RT-PCR (RSV, FLU A&B, COVID)  RVPGX2  LIPASE, BLOOD  URINALYSIS, ROUTINE W REFLEX MICROSCOPIC  I-STAT BETA HCG BLOOD, ED ([facility], [facility], AP ONLY)    EKG None  Radiology CT Abdomen Pelvis W Contrast  Result Date: 01/06/2023 CLINICAL DATA:  Right lower quadrant pain, endometriosis flare. EXAM: CT ABDOMEN AND PELVIS WITH CONTRAST TECHNIQUE: Multidetector CT imaging of the abdomen and pelvis was performed using the standard protocol following bolus administration of intravenous contrast. RADIATION DOSE REDUCTION: This exam was performed according to the departmental dose-optimization program which includes automated exposure control, adjustment of the mA and/or kV according to patient size and/or use of iterative reconstruction technique. CONTRAST:  52m OMNIPAQUE IOHEXOL 350 MG/ML SOLN COMPARISON:  None Available. FINDINGS: Lower chest: Lung bases are clear. Heart is at the upper limits of normal in size. No  pericardial or pleural effusion. Distal esophagus is grossly unremarkable. Hepatobiliary: Liver is unremarkable cholecystectomy. No biliary ductal dilatation. Pancreas: Negative. Spleen: Negative. Adrenals/Urinary Tract: Adrenal glands and kidneys are unremarkable. Ureters are decompressed. Bladder is very low in volume. Stomach/Bowel: Stomach, small bowel, appendix and colon are unremarkable. Vascular/Lymphatic: Vascular structures are unremarkable. No pathologically enlarged lymph nodes. Reproductive: Uterus is visualized.  No adnexal mass. Other: No free fluid. Small umbilical hernia contains fat. Mesenteries and peritoneum are unremarkable. Musculoskeletal: No worrisome lytic or sclerotic lesions. IMPRESSION: No findings to explain the patient's pain. Electronically Signed   By: MLorin PicketM.D.   On: 01/06/2023 14:51    Procedures Procedures    Medications Ordered in ED Medications  ketorolac (TORADOL) 15 MG/ML injection 15 mg (15 mg Intravenous Given 01/06/23 1220)  iohexol (OMNIPAQUE) 350 MG/ML injection 75 mL (75 mLs Intravenous Contrast Given 01/06/23 1432)  HYDROcodone-acetaminophen (NORCO/VICODIN) 5-325 MG  per tablet 1 tablet (1 tablet Oral Given 01/06/23 1505)    ED Course/ Medical Decision Making/ A&P                             Medical Decision Making Amount and/or Complexity of Data Reviewed Labs: ordered.   This patient presents to the ED for concern of abdominal pain, this involves an extensive number of treatment options, and is a complaint that carries with it a high risk of complications and morbidity.  The differential diagnosis includes endometriosis, ectopic pregnancy, ovarian torsion, appendicitis, diverticulitis, nephrolithiasis, cystitis, pyelonephritis, volvulus, CBD pathology, cholecystitis, SBO/LBO, PUD/gastritis, pancreatitis   Co morbidities that complicate the patient evaluation  See HPI   Additional history obtained:  Additional history obtained from  EMR External records from outside source obtained and reviewed including hospital records   Lab Tests:  I Ordered, and personally interpreted labs.  The pertinent results include: No leukocytosis noted.  No evidence of anemia.  Platelets within range.  No electrolyte abnormalities appreciated.  No transaminitis.  No renal dysfunction.  UA without abnormality.  Beta-hCG negative.   Imaging Studies ordered:  I ordered imaging studies including CT abdomen pelvis I independently visualized and interpreted imaging which showed no acute intra-abdominal/pelvic abnormalities. I agree with the radiologist interpretation   Cardiac Monitoring: / EKG:  The patient was maintained on a cardiac monitor.  I personally viewed and interpreted the cardiac monitored which showed an underlying rhythm of: Sinus rhythm   Consultations Obtained:  N/a   Problem List / ED Course / Critical interventions / Medication management  Right lower abdominal pain I ordered medication including total, Norco   Reevaluation of the patient after these medicines showed that the patient improved I have reviewed the patients home medicines and have made adjustments as needed   Social Determinants of Health:  Denies tobacco, illicit drug use.   Test / Admission - Considered:  Right lower abdominal pain Vitals signs within normal range and stable throughout visit. Laboratory/imaging studies significant for: See above Unsure exactly patient's right lower abdominal pain cause.  Very low suspicion for ovarian torsion given duration of symptoms approximately 1 week and a half with no changes on CT abdomen pelvis.  No evidence of ovarian mass, inflammation of appendix or other intra-abdominal or pelvic abnormality.  Patient responded well to NSAIDs administered while emergency department.  Recommend continued outpatient medical therapy of the same.  Close follow-up recommended with OB/GYN outpatient for reassessment of  symptoms.  Patient overall well-appearing, tolerating p.o. without difficulty in no acute distress, afebrile with no white count.  Further workup deemed unnecessary at this point while in the emergency department.  Treatment plan discussed at length with patient and she acknowledged understanding was agreeable to said plan. Worrisome signs and symptoms were discussed with the patient, and the patient acknowledged understanding to return to the ED if noticed. Patient was stable upon discharge.          Final Clinical Impression(s) / ED Diagnoses Final diagnoses:  Right lower quadrant abdominal pain    Rx / DC Orders ED Discharge Orders          Ordered    ibuprofen (ADVIL) 800 MG tablet  3 times daily        01/06/23 1508              Wilnette Kales, [place] 01/06/23 1627    Carmin Muskrat, MD 01/06/23 1701

## 2023-01-06 NOTE — ED Notes (Signed)
Pt. Has requested other blood work and wants to wait for a Dr. To see her 1st before drawing her blood.

## 2023-01-06 NOTE — ED Triage Notes (Signed)
Pt. Stated, Im having a endometriosis flare up with stomach cramping and body aches.  This started a week and half ago.

## 2023-01-06 NOTE — ED Notes (Signed)
Pt was stuck and wasn't successful. Stated she is a hard stick and they can barely find a vein. Try again in back.

## 2023-01-06 NOTE — ED Notes (Signed)
Pt given sprite and crackers  

## 2023-01-06 NOTE — Discharge Instructions (Signed)
Note that your workup today was overall reassuring.  CT abdomen pelvis was without abnormalities.  Recommend taking Tylenol/Motrin as needed for pain outpatient as well as your gabapentin.  Attached is information for follow-up regarding OB/GYN.  Please do not hesitate to return to emergency department for worrisome signs and symptoms we discussed become apparent.

## 2023-01-06 NOTE — ED Notes (Signed)
ED Provider at bedside. 

## 2023-01-06 NOTE — ED Notes (Signed)
Unsuccessful IV attempt.  Tori RN asked to attempt w/ [place].   Pt refuses Resp panel swab.  EDP made aware.

## 2023-01-07 ENCOUNTER — Encounter: Payer: Medicaid Other | Admitting: Student

## 2023-01-07 NOTE — Telephone Encounter (Signed)
Per chart review patient has not been seen in our office or by one of our providers but has upcoming appointment 01/18/23. I called patient and informed her I cannot give advice until she has been seen by one of our providers. She states someone told her that and she went to an ER. I reviewed her gyn appointment and advised she keep appointment. She voices understanding. Staci Acosta

## 2023-01-11 ENCOUNTER — Telehealth: Payer: Self-pay

## 2023-01-11 NOTE — Telephone Encounter (Signed)
Requesting to speak with a nurse about the direction on Lyrica. Please call back.

## 2023-01-11 NOTE — Telephone Encounter (Signed)
Returned pt's call - she stated she takes Lyrica 3 times a day (not just at bedtime) and it has to be The Brand Name. Stated she allergic to the generic. Thanks

## 2023-01-12 ENCOUNTER — Other Ambulatory Visit: Payer: Self-pay | Admitting: Internal Medicine

## 2023-01-12 ENCOUNTER — Other Ambulatory Visit: Payer: Self-pay | Admitting: Student

## 2023-01-12 DIAGNOSIS — M797 Fibromyalgia: Secondary | ICD-10-CM

## 2023-01-12 MED ORDER — PREGABALIN 50 MG PO CAPS: 50.0000 mg | ORAL_CAPSULE | Freq: Three times a day (TID) | ORAL | 3 refills | Status: DC

## 2023-01-14 ENCOUNTER — Other Ambulatory Visit: Payer: Self-pay | Admitting: Student

## 2023-01-14 ENCOUNTER — Ambulatory Visit (INDEPENDENT_AMBULATORY_CARE_PROVIDER_SITE_OTHER): Payer: Medicaid Other | Admitting: Licensed Clinical Social Worker

## 2023-01-14 ENCOUNTER — Telehealth: Payer: Self-pay

## 2023-01-14 DIAGNOSIS — F419 Anxiety disorder, unspecified: Secondary | ICD-10-CM

## 2023-01-14 NOTE — Telephone Encounter (Signed)
Pt states her insurance will not covered Mounjaro, requesting another medication. Please call pt back.

## 2023-01-14 NOTE — Telephone Encounter (Signed)
Jade Lopez did the PA 2/14 and it was denied ... Ozempic is not a covered drug for weight lost

## 2023-01-14 NOTE — BH Specialist Note (Signed)
Integrated Behavioral Health via Telemedicine Visit  01/14/2023 Jade Lopez [id number]  Number of [facility] Clinician visits: 3- Third Visit  Session Start time: [id number]   Session End time: [id number]  Total time in minutes: 60   Referring Provider: Rick Duff Patient/Family location: Home [facility] Provider location: Office All persons participating in visit: Patient and [facility] Types of Service: Individual psychotherapy and [place] ([facility])  I connected with Jade Lopez  via  Telephone or Video Enabled Telemedicine Application  (Video is Caregility application) and verified that I am speaking with the correct person using two identifiers. Discussed confidentiality: Yes   I discussed the limitations of telemedicine and the availability of in person appointments.  Discussed there is a possibility of technology failure and discussed alternative modes of communication if that failure occurs.  I discussed that engaging in this telemedicine visit, they consent to the provision of behavioral healthcare.  Patient and/or legal guardian expressed understanding and consented to Telemedicine visit: Yes   Presenting Concerns: Patient and/or family reports the following symptoms/concerns: Axiety and depression Duration of problem: years; Severity of problem: moderate  Patient and/or Family's Strengths/Protective Factors: Social connections  Goals Addressed: Patient will:  Reduce symptoms of: anxiety and depression   Progress towards Goals: Ongoing  Interventions: Interventions utilized:  Motivational Interviewing, Mindfulness or Relaxation Training, and Supportive Counseling Standardized Assessments completed: PHQ-SADS     01/27/2023    2:15 PM 01/27/2023    2:14 PM 12/14/2022    2:09 PM  PHQ-SADS Last 3 Score only  Total GAD-7 Score 10    PHQ Adolescent Score  12 17       Assessment: Follow up with patient on today. 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 Patient is excited and eager to begin. Patient schedule will be 8:30 am -5Pm. Patient is salaried with benefits. Patient also approved for an apartment and expected to transition on 04/20. Patient purchase work Games developer and is preparing to begin her new role.  Patient currently experiencing Depression and anxiety. Patient reports living with her sister and husband. The dynamics of the household is adding to her depression. [facility] and patient discussed relaxation techniques.     Patient may benefit from Motivational Interviewing, Mindfulness or Relaxation Training, and Supportive Counseling.  Plan: Follow up with behavioral health clinician on : within the next 30 days. Due to patients new work schedule patient will contact office to schedule.    I discussed the assessment and treatment plan with the patient and/or parent/guardian. They were provided an opportunity to ask questions and all were answered. They agreed with the plan and demonstrated an understanding of the instructions.   They were advised to call back or seek an in-person evaluation if the symptoms worsen or if the condition fails to improve as anticipated. Milus Height, MSW, [facility] Direct [phone]  Fax [phone] Main Office Phone: [phone] [address] [place], [place] [address] Website: [place], [place]

## 2023-01-14 NOTE — Telephone Encounter (Signed)
RTC to patient her insurance does not [facility].   Patient is not sure if her insurance will cover Ozempic or [id number].  Would like to get a prescription sent in to see if it will be covered.Marland Kitchen

## 2023-01-18 ENCOUNTER — Encounter: Payer: Medicaid Other | Admitting: Obstetrics and Gynecology

## 2023-01-18 ENCOUNTER — Other Ambulatory Visit: Payer: Self-pay | Admitting: Student

## 2023-01-18 DIAGNOSIS — E119 Type 2 diabetes mellitus without complications: Secondary | ICD-10-CM

## 2023-01-18 MED ORDER — MOUNJARO 2.5 MG/0.5ML ~~LOC~~ SOAJ
2.5000 mg | SUBCUTANEOUS | 2 refills | Status: DC
  Filled 2023-02-18: qty 2, 28d supply, fill #0

## 2023-01-19 ENCOUNTER — Telehealth: Payer: Self-pay

## 2023-01-19 ENCOUNTER — Encounter: Payer: Self-pay | Admitting: Student

## 2023-01-19 NOTE — Telephone Encounter (Signed)
Resubmitted prior authorization per Dr.Carters request for [facility]. Has been submitted 01/18/23 to cover my meds  with last office note and labs awaiting approval or denial.

## 2023-01-19 NOTE — Telephone Encounter (Signed)
Decision:Approved Jade Lopez (Key: [place]) PA Case ID #: [id number] Need Help? Call us at [phone] Outcome Approved today Request Reference Number: [id number]. MOUNJARO INJ 2.5/0.5 is approved through 01/19/2024. For further questions, call [employer] at [phone]. Authorization Expiration Date: 01/19/2024 Drug Mounjaro 2.'5MG'$ /0.5ML pen-injectors ePA cloud logo Form [employer] Electronic Prior Authorization Form [phone] NCPDP)

## 2023-01-27 ENCOUNTER — Ambulatory Visit (INDEPENDENT_AMBULATORY_CARE_PROVIDER_SITE_OTHER): Payer: Medicaid Other | Admitting: Student

## 2023-01-27 VITALS — BP 123/68 | HR 100 | Ht 64.5 in | Wt >= 6400 oz

## 2023-01-27 DIAGNOSIS — G4733 Obstructive sleep apnea (adult) (pediatric): Secondary | ICD-10-CM

## 2023-01-27 DIAGNOSIS — E119 Type 2 diabetes mellitus without complications: Secondary | ICD-10-CM | POA: Diagnosis not present

## 2023-01-27 DIAGNOSIS — G4730 Sleep apnea, unspecified: Secondary | ICD-10-CM | POA: Insufficient documentation

## 2023-01-27 DIAGNOSIS — M329 Systemic lupus erythematosus, unspecified: Secondary | ICD-10-CM | POA: Diagnosis present

## 2023-01-27 DIAGNOSIS — N921 Excessive and frequent menstruation with irregular cycle: Secondary | ICD-10-CM

## 2023-01-27 DIAGNOSIS — R0789 Other chest pain: Secondary | ICD-10-CM | POA: Diagnosis not present

## 2023-01-27 DIAGNOSIS — R748 Abnormal levels of other serum enzymes: Secondary | ICD-10-CM

## 2023-01-27 DIAGNOSIS — F419 Anxiety disorder, unspecified: Secondary | ICD-10-CM

## 2023-01-27 DIAGNOSIS — Z7984 Long term (current) use of oral hypoglycemic drugs: Secondary | ICD-10-CM

## 2023-01-27 DIAGNOSIS — Z0000 Encounter for general adult medical examination without abnormal findings: Secondary | ICD-10-CM

## 2023-01-27 DIAGNOSIS — Z7985 Long-term (current) use of injectable non-insulin antidiabetic drugs: Secondary | ICD-10-CM

## 2023-01-27 DIAGNOSIS — Z6841 Body Mass Index (BMI) 40.0 and over, adult: Secondary | ICD-10-CM

## 2023-01-27 NOTE — Progress Notes (Unsigned)
   CC: F/u of chronic medical issues.   HPI:  [name] is a 27 y.o. F with PMH per below.   Has flare ups where she will have joint pain, leg and arm swelling, and flare of dermatitis/red rash of her face. She has had a work up in the past and was noted to have lupus. 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 Her CRP/ESR have been elevated the past two clinic visits. However, ANA was negative.   Patient reports heavy menses for the past 2 days. Noting that she is bleeding through pads and onto her sheets. The bleeding during the day is less heavy, noting that she goes through about 3 pads. She has become lightheaded as well with standing. She previously used depo-provera injections, norethindrone and orilissa. She did recently discontinue the norethindrone and orlissa last month as she was told the Freida Busman could cause intermittent periods. During her clinic visit 11/2022 she also noted she was having heavy periods. She has follow up with [facility] Gyn here in [place].   T2DM, class 3 obesity 8.3 2 months ago. Started taking mounjaro last week. Not taking jardiance yet due to affordability. Stopped taking metformin as this caused GI upset.    Noted that she started having chest pain last month. The pain is sharp, in the center of her chest, and does not radiate. The pain can happen at rest or with activity. It is usually self limiting. She has had similar discomfort in the past, with anxiety attacks. She states this does not feel like her reflux symptoms.    Pap smear last year. Wait until new insurance in for eye exam.  Past Medical History:  Diagnosis Date   Asthma    Autoimmune disease ([place])    Class 3 obesity ([place])    Endometriosis    Endometritis    Fibromyalgia    Sleep apnea    T2DM (type 2 diabetes mellitus) ([place])    TOA (tubo-ovarian abscess)    Review of Systems:  Negative except per above   Physical Exam:  Vitals:   01/27/23 1325  BP:  123/68  Pulse: 100  SpO2: 100%  Weight: (!) 402 lb 9.6 oz (182.6 kg)  Height: 5' 4.5" (1.638 m)   Constitutional: Well-developed, well-nourished, and in no distress.  HENT:  Head: Normocephalic and atraumatic.  Eyes: EOM are normal.  Neck: Normal range of motion.  Cardiovascular: Normal rate, regular rhythm, intact distal pulses. No gallop and no friction rub.  No murmur heard. No lower extremity edema  Pulmonary: Non labored breathing on room air, no wheezing or rales  Abdominal: Soft. Normal bowel sounds. Non distended and non tender Musculoskeletal: Normal range of motion.        General: No tenderness or edema.  Neurological: Alert and oriented to person, place, and time. Non focal  Skin: Skin is warm and dry.    Assessment & Plan:   See Encounters Tab for problem based charting.  Patient discussed with Dr. Daryll Drown

## 2023-01-27 NOTE — Assessment & Plan Note (Signed)
Diagnosed with sleep apnea at age 27. Has not been able to get new sleep study to get new machine. CPAP with old hosing and not working properly. She has daytime sleepiness and continue to have apneic episodes at night. Continues to snore.  -Will fax this clinic note to [facility].

## 2023-01-27 NOTE — Patient Instructions (Addendum)
For the heavy periods, please reach out to [facility].   During next visit we will discuss starting an SSRI.   Will send referral to rheumatology.  Come back in 1 month for A1c and foot exam.    I will fax this clinic visit note to [facility] regarding your sleep study and cpap.   Please think about HPV vaccine.

## 2023-01-28 ENCOUNTER — Encounter: Payer: Self-pay | Admitting: Student

## 2023-01-28 DIAGNOSIS — R079 Chest pain, unspecified: Secondary | ICD-10-CM | POA: Insufficient documentation

## 2023-01-28 DIAGNOSIS — M549 Dorsalgia, unspecified: Secondary | ICD-10-CM | POA: Insufficient documentation

## 2023-01-28 DIAGNOSIS — Z0000 Encounter for general adult medical examination without abnormal findings: Secondary | ICD-10-CM | POA: Insufficient documentation

## 2023-01-28 DIAGNOSIS — N920 Excessive and frequent menstruation with regular cycle: Secondary | ICD-10-CM | POA: Insufficient documentation

## 2023-01-28 LAB — CBC
Hematocrit: 42.2 % (ref 34.0–46.6)
Hemoglobin: 13.2 g/dL (ref 11.1–15.9)
MCH: 25.3 pg — ABNORMAL LOW (ref 26.6–33.0)
MCHC: 31.3 g/dL — ABNORMAL LOW (ref 31.5–35.7)
MCV: 81 fL (ref 79–97)
Platelets: 322 10*3/uL (ref 150–450)
RBC: 5.22 x10E6/uL (ref 3.77–5.28)
RDW: 13.5 % (ref 11.7–15.4)
WBC: 7.6 10*3/uL (ref 3.4–10.8)

## 2023-01-28 LAB — SEDIMENTATION RATE: Sed Rate: 69 mm/hr — ABNORMAL HIGH (ref 0–32)

## 2023-01-28 LAB — C-REACTIVE PROTEIN: CRP: 28 mg/L — ABNORMAL HIGH (ref 0–10)

## 2023-01-28 NOTE — Assessment & Plan Note (Signed)
On depoprovera injections only now. D/c'd norethindrone and orlissa.  Discussed with patient that she should reach out to her GYN at [facility] for further medication guidance. Her CBC is WNL this clinic visit.

## 2023-01-28 NOTE — Assessment & Plan Note (Signed)
ESR/CRP remain elevated. Sent referral for rheumatology this clinic visit.

## 2023-01-28 NOTE — Assessment & Plan Note (Signed)
Atypical. Do not suspect cardiac etiology. PE. PTX or other serious etiology. This is similar to pain she has experienced with anxiety. She also states this discomfort is different from her reflux symptoms. She is currently on buspar. Would like to think about initiating SSRI/SNRI. Will discuss this at next clinic visit.

## 2023-01-28 NOTE — Assessment & Plan Note (Signed)
Patient sees [facility]. She is possibly interested in starting an SSRI.  -Will discuss SSRI at next clinic visit, continue buspar

## 2023-01-28 NOTE — Assessment & Plan Note (Signed)
Mounjaro for diabetes and weight loss.

## 2023-01-28 NOTE — Assessment & Plan Note (Signed)
Has had recent pap smear.  Would like to think about HPV vaccine

## 2023-01-28 NOTE — Assessment & Plan Note (Signed)
Recently started on mounjaro. Not able to get jardiance.  -Will check A1c at next clinic visit.

## 2023-02-02 ENCOUNTER — Other Ambulatory Visit: Payer: Self-pay | Admitting: Student

## 2023-02-02 ENCOUNTER — Telehealth: Payer: Self-pay

## 2023-02-02 DIAGNOSIS — E119 Type 2 diabetes mellitus without complications: Secondary | ICD-10-CM

## 2023-02-02 NOTE — Telephone Encounter (Signed)
Prior Authorization for patient Jade Lopez) came through on cover my meds was submitted with last office notes and labs awaiting approval or denial.

## 2023-02-02 NOTE — Telephone Encounter (Signed)
[place] (Key: [id number]) PA Case ID #: [id number] Need Help? Call us at [phone] Outcome Approved today Request Reference Number: [id number]. JARDIANCE TAB 10MG  is approved through 02/02/2024. For further questions, call [employer] at [phone]. Authorization Expiration Date: 02/02/2024 Drug Jardiance 10MG  tablets ePA cloud logo Form [employer] Electronic Prior Authorization Form [phone] [place])

## 2023-02-02 NOTE — Telephone Encounter (Signed)
Pt is calling back again about her Placard.   Pt's new requested records have now been rec'd and placed in your box for review. Pt is requesting a call back about her Placard being updated to 5 yrs instead of 6 months.

## 2023-02-03 ENCOUNTER — Other Ambulatory Visit: Payer: Self-pay | Admitting: Student

## 2023-02-03 DIAGNOSIS — E559 Vitamin D deficiency, unspecified: Secondary | ICD-10-CM

## 2023-02-03 NOTE — Telephone Encounter (Signed)
Next appt scheduled 03/17/23 with PCP.

## 2023-02-09 NOTE — Progress Notes (Signed)
Internal Medicine Clinic Attending  Case discussed with Dr. Carter  at the time of the visit.  We reviewed the resident's history and exam and pertinent patient test results.  I agree with the assessment, diagnosis, and plan of care documented in the resident's note.  

## 2023-02-11 ENCOUNTER — Encounter: Payer: Self-pay | Admitting: Student

## 2023-02-12 NOTE — Telephone Encounter (Signed)
I called pt this morning who stated she continues to have some SOB but "not as bad". And after taking Benadryl x 2 she continues to have some itching "in certain areas". I advised pt to go to an UC to be evaluated - stated she can go later today.

## 2023-02-17 ENCOUNTER — Other Ambulatory Visit (HOSPITAL_COMMUNITY): Payer: Self-pay

## 2023-02-18 ENCOUNTER — Ambulatory Visit (INDEPENDENT_AMBULATORY_CARE_PROVIDER_SITE_OTHER): Payer: Medicaid Other | Admitting: Obstetrics and Gynecology

## 2023-02-18 ENCOUNTER — Other Ambulatory Visit: Payer: Self-pay

## 2023-02-18 ENCOUNTER — Encounter: Payer: Self-pay | Admitting: Student

## 2023-02-18 ENCOUNTER — Other Ambulatory Visit (HOSPITAL_COMMUNITY): Payer: Self-pay

## 2023-02-18 VITALS — BP 122/76 | HR 91 | Wt 397.7 lb

## 2023-02-18 DIAGNOSIS — N939 Abnormal uterine and vaginal bleeding, unspecified: Secondary | ICD-10-CM

## 2023-02-18 NOTE — Progress Notes (Signed)
NEW GYNECOLOGY PATIENT Patient name: Jade Lopez MRN [id number]  Date of birth: [birth year] Chief Complaint:   Gynecologic Exam     History:  Jade Lopez is a 27 y.o. No obstetric history on file. being seen today for endometriosis and establish care. Has been having ongoing endometritis  [facility] - started having periods in January Each period is getting heavier and going for logner 2 weeks, changing pads every 30 min to 1 hour (wearing 3 large nursing pads/diabers) Pain is a 9/10; has tried 9/10; believes she is having lupus flare No ibuprofen due to increased bleeding Tried muscle relaxer for lower abdominal pain  - lower abdomina pian for 1 year prior to start of bleeding ; hasn't pickup muscle relaxer yer   Stopped taking northindrone and Jade Lopez ; [facility]  Still getting depo and norethindrone; has not picked up northiedrone as of yet  Been a while since last [place] Still going to see [facility] doctor as well for recurrent endometritis Also going to [facility] for specialty GYN care Had a D&C and there was a lot of infection and then hysterocopy and was in a hospital for 1 week in October  Last depo was recently   Known pelvic floor myalgia - positive physical exam seen in [facility] note "Muscle Right Left Reproduces pain?  Levator +++ ++ yes  Obturator ++++ ++ yes  Piriformis NT NT NT "     Gynecologic History No LMP recorded. Patient has had an injection. Contraception: Depo-Provera injections Last Pap: 04/2022 per chart review    Obstetric History OB History  No obstetric history on file.    Past Medical History:  Diagnosis Date   Asthma    Autoimmune disease (HCC)    Class 3 obesity (HCC)    Endometriosis    Endometritis    Fibromyalgia    Sleep apnea    T2DM (type 2 diabetes mellitus) (HCC)    TOA (tubo-ovarian abscess)     Past Surgical History:  Procedure Laterality Date   CHOLECYSTECTOMY     DILATATION AND CURETTAGE/HYSTEROSCOPY WITH MINERVA      TONSILLECTOMY     TONSILLECTOMY AND ADENOIDECTOMY      Current Outpatient Medications on File Prior to Visit  Medication Sig Dispense Refill   albuterol (ACCUNEB) 1.25 MG/3ML nebulizer solution Take 1 ampule by nebulization every 6 (six) hours as needed for wheezing.     albuterol (VENTOLIN HFA) 108 (90 Base) MCG/ACT inhaler Inhale into the lungs every 6 (six) hours as needed for wheezing or shortness of breath.     Blood Glucose Monitoring Suppl (MM EASY TOUCH GLUCOSE METER) w/Device KIT Please check your blood sugar daily, in the morning. 1 kit 0   busPIRone (BUSPAR) 10 MG tablet Take 3 tablets (30 mg total) by mouth daily. 30 tablet 5   ibuprofen (ADVIL) 800 MG tablet Take 1 tablet (800 mg total) by mouth 3 (three) times daily. 21 tablet 0   JARDIANCE 10 MG TABS tablet TAKE 1 TABLET(10 MG) BY MOUTH DAILY 90 tablet 3   medroxyPROGESTERone (DEPO-PROVERA) 150 MG/ML injection Inject 1 mL every 3 months by intramuscular route.     norethindrone (AYGESTIN) 5 MG tablet Take 5 mg by mouth daily.     ORILISSA 150 MG TABS Take 1 tablet by mouth daily.     pregabalin (LYRICA) 50 MG capsule Take 1 capsule (50 mg total) by mouth 3 (three) times daily. 90 capsule 3   tirzepatide (MOUNJARO) 2.5 MG/0.5ML Pen Inject  2.5 mg into the skin once a week. 2 mL 2   Vitamin D, Ergocalciferol, (DRISDOL) 1.25 MG (50000 UNIT) CAPS capsule Take 1 capsule (50,000 Units total) by mouth every 7 (seven) days. 8 capsule 0   No current facility-administered medications on file prior to visit.    Allergies  Allergen Reactions   Clindamycin/Lincomycin Other (See Comments), Hives, Palpitations, Rash and Swelling    Other Reaction(s): Racing heart beat   Gabapentin Hives, Nausea And Vomiting and Palpitations    Other Reaction(s): Unknown   Other Anaphylaxis    Allergic to Rynatan cough syrup.   Chlorpheniramine-Phenylephrine Nausea And Vomiting   Pregabalin Rash    Allergic to the generic     Social History:  reports  that she has never smoked. She has never used smokeless tobacco. She reports current alcohol use. She reports that she does not use drugs.  Family History  Problem Relation Age of Onset   Diabetes Mother    Hypertension Mother    Diabetes Father    Hypertension Father     The following portions of the patient's history were reviewed and updated as appropriate: allergies, current medications, past family history, past medical history, past social history, past surgical history and problem list.  Review of Systems Pertinent items noted in HPI and remainder of comprehensive ROS otherwise negative.  Physical Exam:  BP 122/76   Pulse 91   Wt (!) 397 lb 11.2 oz (180.4 kg)   BMI 67.21 kg/m  Physical Exam Vitals and nursing note reviewed.  Constitutional:      Appearance: Normal appearance.  Cardiovascular:     Rate and Rhythm: Normal rate.  Pulmonary:     Effort: Pulmonary effort is normal.     Breath sounds: Normal breath sounds.  Neurological:     General: No focal deficit present.     Mental Status: She is alert and oriented to person, place, and time.  Psychiatric:        Mood and Affect: Mood normal.        Behavior: Behavior normal.        Thought Content: Thought content normal.        Judgment: Judgment normal.        Assessment and Plan:   1. Abnormal uterine bleeding (AUB) Pelvic US to assess for structural contribution to bleeding. Follows with other specialists regarding chronic endometritis and endometriosis. CBC today given persistence of bleeding. Patient hoping to get US done ASAP and can get results to her other GYNs.  - US PELVIC COMPLETE WITH TRANSVAGINAL; Future - CBC  Routine preventative health maintenance measures emphasized. Please refer to After Visit Summary for other counseling recommendations.   Follow-up: No follow-ups on file.      Lorriane Shirehristana Lonzie Simmer, MD Obstetrician & Gynecologist, Faculty Practice [facility], [facility]

## 2023-02-19 ENCOUNTER — Encounter: Payer: Self-pay | Admitting: Student

## 2023-02-19 LAB — CBC
Hematocrit: 39.1 % (ref 34.0–46.6)
Hemoglobin: 12.5 g/dL (ref 11.1–15.9)
MCH: 25.7 pg — ABNORMAL LOW (ref 26.6–33.0)
MCHC: 32 g/dL (ref 31.5–35.7)
MCV: 80 fL (ref 79–97)
Platelets: 298 10*3/uL (ref 150–450)
RBC: 4.87 x10E6/uL (ref 3.77–5.28)
RDW: 13.5 % (ref 11.7–15.4)
WBC: 7.9 10*3/uL (ref 3.4–10.8)

## 2023-02-20 DIAGNOSIS — F4323 Adjustment disorder with mixed anxiety and depressed mood: Secondary | ICD-10-CM | POA: Diagnosis not present

## 2023-02-22 ENCOUNTER — Telehealth: Payer: Self-pay | Admitting: *Deleted

## 2023-02-22 NOTE — Telephone Encounter (Signed)
Patient called in c/o one week hx of SHOB at rest and activity, wheezing, intermittent fevers, pain under left rib cage. States she does not have compressor/nebulizer with her and has been using albuterol inhaler only. She states she thinks she has pneumonia again. Patient advised to head directly to ED. States she will.  Of note, patient did not sound in distress. Patient was able to speak in complete sentences, no audible wheezing heard throughout conversation.

## 2023-02-23 ENCOUNTER — Ambulatory Visit (HOSPITAL_COMMUNITY)
Admission: RE | Admit: 2023-02-23 | Discharge: 2023-02-23 | Disposition: A | Discharge status: Home / Self Care | Payer: BC Managed Care – PPO | Source: Ambulatory Visit | Attending: Obstetrics and Gynecology | Admitting: Obstetrics and Gynecology

## 2023-02-23 DIAGNOSIS — N939 Abnormal uterine and vaginal bleeding, unspecified: Secondary | ICD-10-CM | POA: Diagnosis not present

## 2023-02-24 ENCOUNTER — Encounter: Payer: Self-pay | Admitting: Neurology

## 2023-02-24 ENCOUNTER — Ambulatory Visit: Payer: Medicaid Other | Admitting: Neurology

## 2023-02-24 VITALS — BP 126/73 | HR 110 | Ht 64.0 in | Wt 395.4 lb

## 2023-02-24 DIAGNOSIS — R519 Headache, unspecified: Secondary | ICD-10-CM | POA: Diagnosis not present

## 2023-02-24 DIAGNOSIS — R0601 Orthopnea: Secondary | ICD-10-CM | POA: Diagnosis not present

## 2023-02-24 DIAGNOSIS — G4733 Obstructive sleep apnea (adult) (pediatric): Secondary | ICD-10-CM | POA: Diagnosis not present

## 2023-02-24 DIAGNOSIS — J45909 Unspecified asthma, uncomplicated: Secondary | ICD-10-CM

## 2023-02-24 DIAGNOSIS — E662 Morbid (severe) obesity with alveolar hypoventilation: Secondary | ICD-10-CM

## 2023-02-24 DIAGNOSIS — E669 Obesity, unspecified: Secondary | ICD-10-CM

## 2023-02-24 HISTORY — DX: Obesity, unspecified: E66.9

## 2023-02-24 HISTORY — DX: Headache, unspecified: R51.9

## 2023-02-24 NOTE — Patient Instructions (Signed)
Patient owns a 27 year old CPAP set to 15 cm water - per her report.   Needs new titration, may need 02.  SPLIT at [facility] 10/ h  Obesity-Hypoventilation Syndrome Obesity-hypoventilation syndrome (OHS) is a condition in which a person cannot efficiently move air in and out of the lungs (ventilate). This condition causes a buildup of carbon dioxidelevels in the blood and a drop in oxygen levels. OHS can increase the risk for: Cor pulmonale, or right-sided heart failure. Left-sided heart failure. Pulmonary hypertension, or high blood pressure in the arteries in the lungs. Too many red blood cells in the body. Disability or death. What are the causes? This condition may be caused by: Being obese with a BMI (body mass index) greater than or equal to 30 kg/m2. Having too much fat around the abdomen, chest, and neck. The brain being unable to properly manage the high carbon dioxide and low oxygen levels. Hormones made by fat cells. These hormones may interfere with breathing. Sleep apnea. This is when breathing stops, pauses, or is shallow during sleep. What are the signs or symptoms? Symptoms of this condition include: Feeling sleepy during the day. Headaches. These may be worse in the morning. Shortness of breath. Snoring, choking, gasping, or trouble breathing during sleep. Poor concentration or poor memory. Mood changes or feeling irritable. Depression. How is this diagnosed? This condition may be diagnosed by: BMI measurement. Blood tests to measure blood levels of serum bicarbonate, carbon dioxide, and oxygen. Pulse oximetry to measure the amount of oxygen in your blood. This uses a small device that is placed on your finger, earlobe, or toe. Polysomnogram, or sleep study, to check your breathing patterns and levels of oxygen and carbon dioxide while you sleep. You may also have other tests, including: A chest X-ray to rule out other breathing problems. Lung tests, or pulmonary function  tests, to rule out other breathing problems. Electrocardiogram (ECG) or echocardiogram to check for signs of heart failure. How is this treated? This condition may be treated with: A device such as a continuous positive airway pressure (CPAP) machine or a bi-level positive airway pressure (BIPAP) machine. These devices deliver pressure and sometimes oxygen to make breathing easier. A mask may be placed over your nose or mouth. Oxygen if your blood oxygen levels are low. A weight-loss program. Bariatric, or weight-loss, surgery. Tracheostomy. A tube is placed in the windpipe through the neck to help with breathing. Follow these instructions at home:  Medicines Take over-the-counter and prescription medicines only as told by your health care provider. Ask your health care provider what medicines are safe for you. You may be told to avoid medicines such as sedatives and narcotics. These can affect breathing and make OHS worse. Sleeping habits If you are prescribed a CPAP or a BIPAP machine, make sure you understand how to use it. Use your CPAP or BIPAP machine only as told by your health care provider. Try to get at least 8 hours of sleep every night. Eating and drinking  Eat foods that are high in fiber, such as beans, whole grains, and fresh fruits and vegetables. Limit foods that are high in fat and processed sugars, such as fried or sweet foods. Drink enough fluid to keep your urine pale yellow. Do not drink alcohol if: Your health care provider tells you not to drink. You are pregnant, may be pregnant, or are planning to become pregnant. General instructions Follow a diet and exercise plan that helps you reach and keep  a healthy weight as told by your health care provider. Exercise regularly as told by your health care provider. Do not use any products that contain nicotine or tobacco. These products include cigarettes, chewing tobacco, and vaping devices, such as e-cigarettes. If you  need help quitting, ask your health care provider. Keep all follow-up visits. This is important. Contact a health care provider if: You develop new or worsening shortness of breath. You are having trouble waking up or staying awake. You are confused. You develop a cough. You have a fever. Get help right away if: You have chest pain. You have fast or irregular heartbeats. You are dizzy or you faint. You have any symptoms of a stroke. "BE FAST" is an easy way to remember the main warning signs of a stroke: B - Balance. Signs are dizziness, sudden trouble walking, or loss of balance. E - Eyes. Signs are trouble seeing or a sudden change in vision. F - Face. Signs are sudden weakness or numbness of the face, or the face or eyelid drooping on one side. A - Arms. Signs are weakness or numbness in an arm. This happens suddenly and usually on one side of the body. S - Speech. Signs are sudden trouble speaking, slurred speech, or trouble understanding what people say. T - Time. Time to call emergency services. Write down what time symptoms started. You have other signs of a stroke, such as: A sudden, severe headache with no known cause. Nausea or vomiting. Seizure. These symptoms may be an emergency. Get help right away. Call 911. Do not wait to see if the symptoms will go away. Do not drive yourself to the hospital. Summary Obesity-hypoventilation syndrome (OHS) causes a buildup of carbon dioxidelevels in the blood and a drop in oxygen levels. OHS can increase the risk for heart failure, pulmonary hypertension, disability, and death. Follow your diet and exercise plan as told by your health care provider. Get help right away if you have any symptoms of a stroke. This information is not intended to replace advice given to you by your health care provider. Make sure you discuss any questions you have with your health care provider. Document Revised: 06/03/2021 Document Reviewed:  06/03/2021 Elsevier Patient Education  2023 [employer].

## 2023-02-24 NOTE — Addendum Note (Signed)
Addended by: Melvyn Novas on: 02/24/2023 08:52 AM   Modules accepted: Orders

## 2023-02-24 NOTE — Progress Notes (Addendum)
SLEEP MEDICINE CLINIC    Provider:  Melvyn Novas, MD  Primary Care Physician:  Marolyn Haller, MD [address] [place] [address]     Referring Provider: Miguel Aschoff, Md [address] [place],  [place] [address]          Chief Complaint according to patient   Patient presents with:     New Patient (Initial Visit)           HISTORY OF PRESENT ILLNESS:  Jade Lopez is a 27 y.o. female patient who  arrived late for her Consultation,  referral on 02/24/2023 from PCP.  Chief concern according to patient :  " I was diagnosed at age 57 with OSA, used CPAP ever since."   I have the pleasure of seeing Alejandro Adcox 02/24/23 an AA-27 year-old female with a possible sleep disorder.    The patient had the first sleep study in the year 2010 in [place], [place],  with a result of OSA , a follow up study was done in [place]. Current settings ramping from 8-15 cm water. Her machine is 27 years OLD !!!!   Sleep relevant medical history: Sleepiness, fatigue, Morbid obesity, no Nocturia, had a Tonsillectomy, no cervical spine surgery. She reports she chokes often after drink or food intake. History of GERD.     Family medical /sleep history:  Other family member on CPAP with OSA, insomnia, sleep walkers.    Social history:  Patient is working as a Sports coach at [employer] and lives in a household with sister and brother - in Social worker. One cat.  The patient currently works in daytime/ but used to work in shifts( Chief Technology Officer,) until 2022  Tobacco use; none .  ETOH use ; yes, 2/ month,  Caffeine intake in form of Coffee( 4/ day ) Soda( /) Tea ( /) or energy drinks Exercise in form of swimming.      Sleep habits are as follows: The patient's dinner time is between 8 PM. The patient goes to bed at 10 PM and continues to sleep for 5 hours, wakes and goes to sleep again for a total 7-8 .   The preferred sleep position is dictated by orthopnoea ,  with the support of 10 pillows.  Dreams are reportedly rare/ infrequent.  The patient wakes up with an alarm. Multiple alarms at 6, 7.30 and 7.45,  7. 30-   AM is the usual rise time. She reports not feeling refreshed or restored in AM, with symptoms such as dry mouth, morning headaches, and residual fatigue. She has often migraines in AM, often so severe that it leads to ED visits.  Naps are taken infrequently, they are less refreshing than nocturnal sleep.    Review of Systems: Out of a complete 14 system review, the patient complains of only the following symptoms, and all other reviewed systems are negative.:  Fatigue, sleepiness , snoring, fragmented sleep.    MIGRAINE GERD ORTHOPNOA, ASTHMA.   How likely are you to doze in the following situations: 0 = not likely, 1 = slight chance, 2 = moderate chance, 3 = high chance   Sitting and Reading? Watching Television? Sitting inactive in a public place (theater or meeting)? As a passenger in a car for an hour without a break? Lying down in the afternoon when circumstances permit? Sitting and talking to someone? Sitting quietly after lunch without alcohol? In a car, while stopped for a few minutes  in traffic?   Total = 10/ 24 points  on current CPAP   FSS endorsed at 38/ 63 points.   Sleep inertia.   Social History   Socioeconomic History   Marital status: Single    Spouse name: Not on file   Number of children: Not on file   Years of education: Not on file   Highest education level: Not on file  Occupational History   Not on file  Tobacco Use   Smoking status: Never   Smokeless tobacco: Never  Substance and Sexual Activity   Alcohol use: Yes   Drug use: No   Sexual activity: Not on file  Other Topics Concern   Not on file  Social History Narrative   Not on file   Social Determinants of Health   Financial Resource Strain: Not on file  Food Insecurity: No Food Insecurity (12/14/2022)   Hunger Vital Sign     Worried About Running Out of Food in the Last Year: Never true    Ran Out of Food in the Last Year: Never true  Transportation Needs: No Transportation Needs (12/14/2022)   PRAPARE - Administrator, Civil Service (Medical): No    Lack of Transportation (Non-Medical): No  Physical Activity: Not on file  Stress: Not on file  Social Connections: Socially Isolated (12/14/2022)   Social Connection and Isolation Panel [NHANES]    Frequency of Communication with Friends and Family: More than three times a week    Frequency of Social Gatherings with Friends and Family: More than three times a week    Attends Religious Services: Never    Database administrator or Organizations: No    Attends Engineer, structural: Never    Marital Status: Never married    Family History  Problem Relation Age of Onset   Diabetes Mother    Hypertension Mother    Diabetes Father    Hypertension Father     Past Medical History:  Diagnosis Date   Asthma    Autoimmune disease    Class 3 obesity    Endometriosis    Endometritis    Fibromyalgia    Sleep apnea    T2DM (type 2 diabetes mellitus)    TOA (tubo-ovarian abscess)     Past Surgical History:  Procedure Laterality Date   CHOLECYSTECTOMY     DILATATION AND CURETTAGE/HYSTEROSCOPY WITH MINERVA     TONSILLECTOMY     TONSILLECTOMY AND ADENOIDECTOMY       Current Outpatient Medications on File Prior to Visit  Medication Sig Dispense Refill   albuterol (ACCUNEB) 1.25 MG/3ML nebulizer solution Take 1 ampule by nebulization every 6 (six) hours as needed for wheezing.     albuterol (VENTOLIN HFA) 108 (90 Base) MCG/ACT inhaler Inhale into the lungs every 6 (six) hours as needed for wheezing or shortness of breath.     Blood Glucose Monitoring Suppl (MM EASY TOUCH GLUCOSE METER) w/Device KIT Please check your blood sugar daily, in the morning. 1 kit 0   busPIRone (BUSPAR) 10 MG tablet Take 3 tablets (30 mg total) by mouth daily. 30 tablet  5   ibuprofen (ADVIL) 800 MG tablet Take 1 tablet (800 mg total) by mouth 3 (three) times daily. 21 tablet 0   JARDIANCE 10 MG TABS tablet TAKE 1 TABLET(10 MG) BY MOUTH DAILY 90 tablet 3   medroxyPROGESTERone (DEPO-PROVERA) 150 MG/ML injection Inject 1 mL every 3 months by intramuscular route.     norethindrone (  AYGESTIN) 5 MG tablet Take 5 mg by mouth daily.     ORILISSA 150 MG TABS Take 1 tablet by mouth daily.     pregabalin (LYRICA) 50 MG capsule Take 1 capsule (50 mg total) by mouth 3 (three) times daily. 90 capsule 3   tirzepatide (MOUNJARO) 2.5 MG/0.5ML Pen Inject 2.5 mg into the skin once a week. 2 mL 2   Vitamin D, Ergocalciferol, (DRISDOL) 1.25 MG (50000 UNIT) CAPS capsule TAKE 1 CAPSULE BY MOUTH EVERY 7 DAYS 8 capsule 0   No current facility-administered medications on file prior to visit.    Allergies  Allergen Reactions   Clindamycin/Lincomycin Other (See Comments), Hives, Palpitations, Rash and Swelling    Other Reaction(s): Racing heart beat   Gabapentin Hives, Nausea And Vomiting and Palpitations    Other Reaction(s): Unknown   Other Anaphylaxis    Allergic to Rynatan cough syrup.   Chlorpheniramine-Phenylephrine Nausea And Vomiting   Pregabalin Rash    Allergic to the generic      DIAGNOSTIC DATA (LABS, IMAGING, TESTING) - I reviewed patient records, labs, notes, testing and imaging myself where available.  Lab Results  Component Value Date   WBC 7.9 02/18/2023   HGB 12.5 02/18/2023   HCT 39.1 02/18/2023   MCV 80 02/18/2023   PLT 298 02/18/2023      Component Value Date/Time   NA 137 01/06/2023 0933   NA 141 11/25/2022 1149   K 4.0 01/06/2023 0933   CL 105 01/06/2023 0933   CO2 23 01/06/2023 0933   GLUCOSE 135 (H) 01/06/2023 0933   BUN 7 01/06/2023 0933   BUN 7 11/25/2022 1149   CREATININE 0.70 01/06/2023 0933   CALCIUM 8.9 01/06/2023 0933   PROT 7.0 01/06/2023 0933   PROT 6.6 11/25/2022 1149   ALBUMIN 3.2 (L) 01/06/2023 0933   ALBUMIN 3.7 (L)  11/25/2022 1149   AST 13 (L) 01/06/2023 0933   ALT 11 01/06/2023 0933   ALKPHOS 91 01/06/2023 0933   BILITOT 0.5 01/06/2023 0933   BILITOT <0.2 11/25/2022 1149   GFRNONAA >60 01/06/2023 0933   Lab Results  Component Value Date   CHOL 166 11/30/2022   HDL 51 11/30/2022   LDLCALC 96 11/30/2022   TRIG 103 11/30/2022   CHOLHDL 3.3 11/30/2022   Lab Results  Component Value Date   HGBA1C 8.3 (H) 11/25/2022   Lab Results  Component Value Date   VITAMINB12 556 11/25/2022   Lab Results  Component Value Date   TSH 2.820 11/30/2022    PHYSICAL EXAM:  Today's Vitals   02/24/23 0812  BP: 126/73  Pulse: (!) 110  Weight: (!) 395 lb 6.4 oz (179.4 kg)  Height: 5\' 4"  (1.626 m)   Body mass index is 67.87 kg/m.   Wt Readings from Last 3 Encounters:  02/24/23 (!) 395 lb 6.4 oz (179.4 kg)  02/18/23 (!) 397 lb 11.2 oz (180.4 kg)  01/27/23 (!) 402 lb 9.6 oz (182.6 kg)     Ht Readings from Last 3 Encounters:  02/24/23 5\' 4"  (1.626 m)  01/27/23 5' 4.5" (1.638 m)  01/06/23 5' 4.5" (1.638 m)      General: The patient is awake, alert and appears not in acute distress. The patient is well groomed. Head: Normocephalic, atraumatic. Neck is supple.  Mallampati 3,  neck circumference:18 inches . Nasal airflow  patent.  Retrognathia is not seen.  Dental status: wore braces. Cardiovascular:  Regular rate and cardiac rhythm by pulse,  without distended neck  veins. Respiratory: Lungs are clear to auscultation.  Skin:  Without evidence of ankle edema, or rash. Trunk: The patient's posture is erect.   NEUROLOGIC EXAM: The patient is awake and alert, oriented to place and time.   Memory subjective described as intact.  Attention span & concentration ability appears normal.  Speech is fluent,  without  dysarthria, dysphonia or aphasia.  Mood and affect are appropriate.   Cranial nerves: no loss of smell or taste reported  Pupils are equal and briskly reactive to light. Funduscopic exam  def..  Extraocular movements in vertical and horizontal planes were intact and without nystagmus. No Diplopia. Visual fields by finger perimetry are intact. Hearing was intact to soft voice and finger rubbing.    Facial sensation intact to fine touch.  Facial motor strength is symmetric and tongue and uvula move midline.  Neck ROM : rotation, tilt and flexion extension were normal for age and shoulder shrug was symmetrical.    Motor exam:  Symmetric bulk, tone and ROM.   Normal tone without cog wheeling, symmetric grip strength .   Gait and station: Patient could rise unassisted from a seated position, walked without assistive device.  Stance is of wider base .  Toe and heel walk were deferred.  Deep tendon reflexes: in the  upper and lower extremities are symmetric and intact.  Babinski response was deferred.    ASSESSMENT AND PLAN 27 y.o. year old female  here without CPAP machine, mask  or sleep study copy.     1) OSA in the setting of Obesity class 3, Asthma and migraine headaches.    2) Referral to weight and wellness.  3) I will order an-in lab sleep study for this patient to evaluate for OSA , baseline Hypoxia and for possible hypoventilation.    SPLIT study at [facility] 10/h. Patient owns a 27 year old CPAP set to 15 cm water - per her report.   Needs new titration, may need 02.   Addendum : MEDICAID ends in MAY 2024- she can't wait for in -lab sleep test, will need HST.   I plan to follow up either personally or through our NP within 5 months.   I would like to thank Marolyn Haller, MD and Miguel Aschoff, Md [address] [place],  [place] [address] for allowing me to meet with and to take care of this pleasant patient.   CC: I will share my notes with PCP.  After spending a total time of  35  minutes face to face and additional time for physical and neurologic examination, review of laboratory studies,  personal review of imaging studies, reports and results of  other testing and review of referral information / records as far as provided in visit,   Electronically signed by: Melvyn Novas, MD 02/24/2023 8:24 AM  [facility] Board certified by The [employer] of Sleep Medicine and Diplomate of the [employer] of Sleep Medicine. Board certified In Neurology through the ABPN, Fellow of the [employer] of Neurology. Medical Director of [facility].

## 2023-03-01 ENCOUNTER — Encounter: Payer: Self-pay | Admitting: Internal Medicine

## 2023-03-01 ENCOUNTER — Ambulatory Visit (INDEPENDENT_AMBULATORY_CARE_PROVIDER_SITE_OTHER): Payer: BC Managed Care – PPO | Admitting: Internal Medicine

## 2023-03-01 VITALS — BP 117/72 | HR 103 | Ht 64.0 in | Wt 397.4 lb

## 2023-03-01 DIAGNOSIS — R109 Unspecified abdominal pain: Secondary | ICD-10-CM

## 2023-03-01 MED ORDER — DICLOFENAC SODIUM 1 % EX GEL: 4.0000 g | Freq: Four times a day (QID) | CUTANEOUS | 1 refills | Status: DC

## 2023-03-01 NOTE — Assessment & Plan Note (Signed)
Patient presents with throbbing left sided abdominal pain. Pain does not feel superficial to her. Says that this has been going on for 3-4 days. She denies any current nausea (had some when taking tirzepatide), nor vomiting (again only present when she was actively taking tirzepatide), no significant changes regarding constipation or diarrhea. She is not able to tell me about any alleviating or aggravating factors. No association with food or BM. No fevers. She does have a history of endometriosis/endometritis but states this pain feels different. States she feels something hard left side of abd. Exam limited secondary to body habitus, but pain is reproducible with palpation of left middle abd. Pain appears to be located more deeply than skin, but is not deep enough to be consistent with pain from endometriosis/ovarian, splenic, or colonic. I was able to palpate small region on the left side that felt consistent with possible abdominal wall hernia. This was consistent with patient's reported area of pain. Based on patients history and physical, I have a low suspicion of intra-abdominal etiology for this pain. Seems most consistent with abdominal wall pain. I have recommended conservative management with tylenol. We can also try topicals, though benefit from this may be limited.

## 2023-03-01 NOTE — Patient Instructions (Addendum)
Dear Jade Lopez,   Thank you for trusting [place] with your care.   We evaluated you for abdominal pain. I think it is most likely an abdominal wall hernia. I recommend taking scheduled tylenol. You can use lidocaine patches and diclofenac gel to help as well. If this pain is not controlled with these measures or becomes much worse, please call us back for further evaluation.

## 2023-03-01 NOTE — Progress Notes (Signed)
   CC: stomach pain  HPI:Jade Lopez is a 27 y.o. female who presents for evaluation of abd pain. Please see individual problem based A/P for details.  26 yof morbidly obese, endometriosis/endometritis Presents with complaints of abd pain.  Depression, PHQ-9: Based on the patients  Flowsheet Row Office Visit from 02/18/2023 in [facility] [employer]  PHQ-9 Total Score 15      score we have .  Past Medical History:  Diagnosis Date   Asthma    Autoimmune disease    Class 3 obesity    Endometriosis    Endometritis    Fibromyalgia    Sleep apnea    T2DM (type 2 diabetes mellitus)    TOA (tubo-ovarian abscess)    Review of Systems:   See hpi  Physical Exam: Vitals:   03/01/23 1327  BP: 117/72  Pulse: (!) 103  SpO2: 99%  Weight: (!) 397 lb 6.4 oz (180.3 kg)  Height:  (1.626 m)     General: nad, morbidly obese HEENT: Conjunctiva nl , antiicteric sclerae, moist mucous membranes, no exudate or erythema Cardiovascular: Normal rate, regular rhythm.  No murmurs, rubs, or gallops Pulmonary : Equal breath sounds, No wheezes, rales, or rhonchi Abdominal: soft, nontender,  bowel sounds present Ext: No edema in lower extremities, no tenderness to palpation of lower extremities.   Assessment & Plan:   See Encounters Tab for problem based charting.  Abdominal wall pain Patient presents with throbbing left sided abdominal pain. Pain does not feel superficial to her. Says that this has been going on for 3-4 days. She denies any current nausea (had some when taking tirzepatide), nor vomiting (again only present when she was actively taking tirzepatide), no significant changes regarding constipation or diarrhea. She is not able to tell me about any alleviating or aggravating factors. No association with food or BM. No fevers. She does have a history of endometriosis/endometritis but states this pain feels different. States she feels  something hard left side of abd. Exam limited secondary to body habitus, but pain is reproducible with palpation of left middle abd. Pain appears to be located more deeply than skin, but is not deep enough to be consistent with pain from endometriosis/ovarian, splenic, or colonic. I was able to palpate small region on the left side that felt consistent with possible abdominal wall hernia. This was consistent with patient's reported area of pain. Based on patients history and physical, I have a low suspicion of intra-abdominal etiology for this pain. Seems most consistent with abdominal wall pain. I have recommended conservative management with tylenol. We can also try topicals, though benefit from this may be limited.    Patient discussed with Dr. Cleda Daub

## 2023-03-02 ENCOUNTER — Encounter: Payer: Self-pay | Admitting: Obstetrics and Gynecology

## 2023-03-02 ENCOUNTER — Telehealth: Payer: Self-pay

## 2023-03-02 NOTE — Telephone Encounter (Signed)
Recived notification from [facility] to give them a call back about results.  [facility] and was informed that they had spoken with Dr. Briscoe Deutscher.    Leonette Nutting  03/02/23

## 2023-03-04 ENCOUNTER — Emergency Department (HOSPITAL_COMMUNITY)
Admission: EM | Admit: 2023-03-04 | Discharge: 2023-03-04 | Disposition: A | Discharge status: Home / Self Care | Payer: BC Managed Care – PPO | Attending: Emergency Medicine | Admitting: Emergency Medicine

## 2023-03-04 ENCOUNTER — Telehealth: Payer: Self-pay

## 2023-03-04 ENCOUNTER — Other Ambulatory Visit: Payer: Self-pay

## 2023-03-04 ENCOUNTER — Encounter (HOSPITAL_COMMUNITY): Payer: Self-pay

## 2023-03-04 ENCOUNTER — Other Ambulatory Visit (HOSPITAL_COMMUNITY): Payer: Self-pay

## 2023-03-04 ENCOUNTER — Telehealth: Payer: Self-pay | Admitting: Internal Medicine

## 2023-03-04 ENCOUNTER — Emergency Department (HOSPITAL_COMMUNITY): Payer: BC Managed Care – PPO

## 2023-03-04 DIAGNOSIS — R110 Nausea: Secondary | ICD-10-CM | POA: Insufficient documentation

## 2023-03-04 DIAGNOSIS — R1012 Left upper quadrant pain: Secondary | ICD-10-CM | POA: Diagnosis not present

## 2023-03-04 DIAGNOSIS — R197 Diarrhea, unspecified: Secondary | ICD-10-CM | POA: Diagnosis not present

## 2023-03-04 DIAGNOSIS — R109 Unspecified abdominal pain: Secondary | ICD-10-CM | POA: Diagnosis not present

## 2023-03-04 DIAGNOSIS — E119 Type 2 diabetes mellitus without complications: Secondary | ICD-10-CM | POA: Insufficient documentation

## 2023-03-04 LAB — COMPREHENSIVE METABOLIC PANEL
ALT: 14 U/L (ref 0–44)
AST: 15 U/L (ref 15–41)
Albumin: 3.4 g/dL — ABNORMAL LOW (ref 3.5–5.0)
Alkaline Phosphatase: 78 U/L (ref 38–126)
Anion gap: 8 (ref 5–15)
BUN: 5 mg/dL — ABNORMAL LOW (ref 6–20)
CO2: 21 mmol/L — ABNORMAL LOW (ref 22–32)
Calcium: 8.8 mg/dL — ABNORMAL LOW (ref 8.9–10.3)
Chloride: 108 mmol/L (ref 98–111)
Creatinine, Ser: 0.75 mg/dL (ref 0.44–1.00)
GFR, Estimated: >60 mL/min (ref >60)
Glucose, Bld: 118 mg/dL — ABNORMAL HIGH (ref 70–99)
Potassium: 4 mmol/L (ref 3.5–5.1)
Sodium: 137 mmol/L (ref 135–145)
Total Bilirubin: 0.4 mg/dL (ref 0.3–1.2)
Total Protein: 7.2 g/dL (ref 6.5–8.1)

## 2023-03-04 LAB — URINALYSIS, ROUTINE W REFLEX MICROSCOPIC
Bilirubin Urine: NEGATIVE
Glucose, UA: NEGATIVE mg/dL
Hgb urine dipstick: NEGATIVE
Ketones, ur: NEGATIVE mg/dL
Leukocytes,Ua: NEGATIVE
Nitrite: NEGATIVE
Protein, ur: NEGATIVE mg/dL
Specific Gravity, Urine: 1.018 (ref 1.005–1.030)
pH: 6 (ref 5.0–8.0)

## 2023-03-04 LAB — CBC WITH DIFFERENTIAL/PLATELET
Abs Immature Granulocytes: 0.05 10*3/uL (ref 0.00–0.07)
Basophils Absolute: 0.1 10*3/uL (ref 0.0–0.1)
Basophils Relative: 1 %
Eosinophils Absolute: 0.2 10*3/uL (ref 0.0–0.5)
Eosinophils Relative: 2 %
HCT: 41.7 % (ref 36.0–46.0)
Hemoglobin: 13.1 g/dL (ref 12.0–15.0)
Immature Granulocytes: 1 %
Lymphocytes Relative: 30 %
Lymphs Abs: 2.9 10*3/uL (ref 0.7–4.0)
MCH: 25.8 pg — ABNORMAL LOW (ref 26.0–34.0)
MCHC: 31.4 g/dL (ref 30.0–36.0)
MCV: 82.2 fL (ref 80.0–100.0)
Monocytes Absolute: 0.6 10*3/uL (ref 0.1–1.0)
Monocytes Relative: 6 %
Neutro Abs: 5.9 10*3/uL (ref 1.7–7.7)
Neutrophils Relative %: 60 %
Platelets: 334 10*3/uL (ref 150–400)
RBC: 5.07 MIL/uL (ref 3.87–5.11)
RDW: 14.6 % (ref 11.5–15.5)
WBC: 9.6 10*3/uL (ref 4.0–10.5)
nRBC: 0 % (ref 0.0–0.2)

## 2023-03-04 LAB — LIPASE, BLOOD: Lipase: 27 U/L (ref 11–51)

## 2023-03-04 LAB — I-STAT BETA HCG BLOOD, ED (MC, WL, AP ONLY): I-stat hCG, quantitative: <5 m[IU]/mL (ref <5)

## 2023-03-04 MED ORDER — ACETAMINOPHEN 500 MG PO TABS
1000.0000 mg | ORAL_TABLET | Freq: Once | ORAL | Status: AC
  Administered 2023-03-04: 1000 mg via ORAL
  Filled 2023-03-04: qty 2

## 2023-03-04 MED ORDER — ONDANSETRON 4 MG PO TBDP
4.0000 mg | ORAL_TABLET | Freq: Once | ORAL | Status: AC
  Administered 2023-03-04: 4 mg via ORAL
  Filled 2023-03-04: qty 1

## 2023-03-04 MED ORDER — LACTATED RINGERS IV BOLUS
1000.0000 mL | Freq: Once | INTRAVENOUS | Status: AC
  Administered 2023-03-04: 1000 mL via INTRAVENOUS

## 2023-03-04 MED ORDER — ONDANSETRON HCL 4 MG/2ML IJ SOLN
4.0000 mg | Freq: Once | INTRAMUSCULAR | Status: AC
  Administered 2023-03-04: 4 mg via INTRAVENOUS
  Filled 2023-03-04: qty 2

## 2023-03-04 MED ORDER — MORPHINE SULFATE (PF) 4 MG/ML IV SOLN
4.0000 mg | Freq: Once | INTRAVENOUS | Status: AC
  Administered 2023-03-04: 4 mg via INTRAVENOUS
  Filled 2023-03-04: qty 1

## 2023-03-04 MED ORDER — CYCLOBENZAPRINE HCL 10 MG PO TABS: 5.0000 mg | ORAL_TABLET | Freq: Two times a day (BID) | ORAL | 0 refills | Status: DC | PRN

## 2023-03-04 MED ORDER — IOHEXOL 350 MG/ML SOLN
75.0000 mL | Freq: Once | INTRAVENOUS | Status: AC | PRN
  Administered 2023-03-04: 75 mL via INTRAVENOUS

## 2023-03-04 NOTE — Telephone Encounter (Signed)
Patient called into clinic reporting her pain had worsened. I called patient about this. She reports the discomfort on the left side of her abd has worsened, still a throbbing pain. She left work early this AM due to pain. She has not had any vomiting, nor diarrhea, nor bleeding. She has felt lightheaded with subjective fever, but not recorded any elevated temp since her "thermometer broke".  She has since gone to the ED which I agree with for further evaluation and consideration of imaging.

## 2023-03-04 NOTE — Discharge Instructions (Signed)
Jade Lopez  Thank you for allowing [place] to take care of you today.  You came to the Emergency Department today because you have had some diarrhea and abdominal pain for approximately 1 week that worsened over the past several days.  Your pancreas, liver, kidney numbers were normal here in the emergency department.  We did a CT of your abdomen and pelvis, you have a small hernia around your bellybutton, you do not have a hernia in the left upper part of your abdomen where your pain is.  You do have some nonspecific inflammation of your spleen, you state that you have had this before.  However otherwise your vitals are well, and your infection cell count is not elevated.  We recommend following this up with your primary care physician and avoiding any contact sports until your spleen size has normalized.  Your workup does not specifically indicate what is causing your pain, it could be where your diaphragm is irritated from the increased Z[place]itz of your spleen, and it also could be that you are experiencing some constipation and hard stools, we recommend taking a stool softener such as MiraLAX once a day for 1 week and seeing if this helps your pain.   To-Do: 1. Please follow-up with your primary doctor within 2 days / as soon as possible.   Please return to the Emergency Department or call 911 if you experience have worsening of your symptoms, or do not get better, chest pain, shortness of breath, severe or significantly worsening pain, high fever, severe confusion, pass out or have any reason to think that you need emergency medical care.   We hope you feel better soon.   Department of Emergency Medicine [facility], [facility], [facility]

## 2023-03-04 NOTE — Telephone Encounter (Signed)
Patient called regarding her hernia she stated she is having excruciating abdomen pain she stated the lidocaine patch has not relived her pain she is requesting a ultrasound. Please call patient to discuss further.

## 2023-03-04 NOTE — ED Triage Notes (Signed)
Pt c/o left mid abdominal painx1wk. Pt c/o diarrheax3d. Pt denies N/V. Pt states had diarrhea about 4-5 times within last 24 hrs.

## 2023-03-04 NOTE — ED Provider Notes (Signed)
[place] EMERGENCY DEPARTMENT AT [facility] Provider Note  Medical Decision Making   HPI: Jade Lopez is a 27 y.o. female with history perinent for class III obesity, DM, lupus, endometriosis, OSA, who presents complaining of abdominal pain. Patient arrived via POV.  History provided by patient.  Interpreter required for this encounter.  She reports that she has had abdominal pain for approximately 1 week.  Reports that she was seen by her PCP and diagnosed with a hernia in her left upper quadrant.  Reports that she has had ongoing pain that has worsened over the past 48 hours.  Reports that pain is constant, no aggravating or alleviating factors.  She says that Tylenol partially relieves the pain.  Reports that she has had loose stools over the past 72 hours.  Endorses occasional nausea, denies vomiting.  Denies any fevers, chills, chest pain, shortness of breath.  ROS: As per HPI. Please see MAR for complete past medical history, surgical history, and social history.   Physical exam is pertinent for tenderness to palpation in left upper quadrant, no palpable masses, vitally stable.   The differential includes but is not limited to bowel obstruction, hernia, incarcerated hernia, strangulated hernia, bowel obstruction, pancreatitis, gastritis, gastroenteritis, diverticulitis.  Additional history obtained from: Chart review External records from outside source obtained and reviewed including: Reviewed patient's telephone and clinic notes from internal medicine  ED provider interpretation of ECG: Not indicated  ED provider interpretation of radiology/imaging: CT abdomen pelvis with small umbilical hernia, I do not appreciate left upper quadrant hernia, I do not appreciate any air-fluid levels, hyperenhancement of the bowel wall, intra-abdominal free air, significant intra-abdominal free fluid  Labs ordered were interpreted by myself as well as my attending and were  incorporated into the medical decision making process for this patient.  ED provider interpretation of labs: Beta-hCG negative, lipase WNL.  CMP without AKI or emergent electrolyte derangement.  UA without UTI, CBC without leukocytosis, anemia, thrombocytopenia.  Interventions: Tylenol, Zofran, morphine, LR  See the EMR for full details regarding lab and imaging results.  On initial evaluation, patient is overall comfortable and well-appearing, patient has left upper quadrant tenderness, however overall exam is limited by habitus.  Patient is vitally stable.  With regard to labs obtained in triage, patient does not have UTI, doubt pregnancy given beta-hCG.  No AKI, CBC without leukocytosis, which makes intra-abdominal infection less likely.  Patient reports that she has had diarrhea, has normoactive bowel sounds, therefore doubt bowel obstruction.  Overall given limits of exam due to habitus and reported LUQ hernia CT is indicated. CT reveals mild splenomegaly and nonspecific mesenteric lymphadenopathy.  In the absence of any infectious symptoms, leukocytosis, fever, both the splenomegaly and mesenteric lymphadenopathy is nonspecific, discussed this with the patient at bedside, patient states that she has previously had nonspecific splenomegaly.  Patient does not have any other symptoms concerning for mononucleosis, however discussed contact sport precautions until she should have resolution of splenomegaly, patient expressed understanding.  Discussed that other possible etiology could be constipation causing some abdominal pain especially given patient's duration of pain and intermittency, patient expresses understanding, recommended close follow-up with PCP as well as trial of MiraLAX, discharged in stable condition.     Consults: Not indicated  Disposition: DISCHARGE: I believe that the patient is safe for discharge home with outpatient follow-up. Patient was informed of all pertinent physical exam,  laboratory, and imaging findings including splenomegaly and mesenteric lymphadenopathy Patient's suspected etiology of their symptom presentation  was discussed with the patient and all questions were answered. We discussed following up with PCP. I provided thorough ED return precautions. The patient feels safe and comfortable with this plan.  The plan for this patient was discussed with Dr. Silverio Lay, who voiced agreement and who oversaw evaluation and treatment of this patient.  Clinical Impression:  1. Left upper quadrant abdominal pain    Discharge  Therapies: These medications and interventions were provided for the patient while in the ED. Medications  acetaminophen (TYLENOL) tablet 1,000 mg (1,000 mg Oral Given 03/04/23 1605)  lactated ringers bolus 1,000 mL (0 mLs Intravenous Stopped 03/04/23 1951)  ondansetron (ZOFRAN) injection 4 mg (4 mg Intravenous Given 03/04/23 1742)  ondansetron (ZOFRAN-ODT) disintegrating tablet 4 mg (4 mg Oral Given 03/04/23 1715)  morphine (PF) 4 MG/ML injection 4 mg (4 mg Intravenous Given 03/04/23 1742)  iohexol (OMNIPAQUE) 350 MG/ML injection 75 mL (75 mLs Intravenous Contrast Given 03/04/23 1844)    MDM generated using voice dictation software and may contain dictation errors.  Please contact me for any clarification or with any questions.  Clinical Complexity A medically appropriate history, review of systems, and physical exam was performed.  Collateral history obtained from: Chart review I personally reviewed the labs, EKG, imaging as discussed above. Patient's presentation is most consistent with acute complicated illness / injury requiring diagnostic workup Considered and ruled out life and body threatening conditions  Treatment: Outpatient follow-up Medications: Parenteral controlled substances, prescription Discussed patient's care with providers from the following different specialties: None  Physical Exam   ED Triage Vitals  Enc Vitals Group      BP 03/04/23 1305 127/86     Pulse Rate 03/04/23 1305 (!) 109     Resp 03/04/23 1305 16     Temp 03/04/23 1305 99.5 F (37.5 C)     Temp Source 03/04/23 1305 Oral     SpO2 03/04/23 1305 98 %     Weight 03/04/23 1312 (!) 397 lb 7.8 oz (180.3 kg)     Height 03/04/23 1312 5\' 4"  (1.626 m)     Head Circumference --      Peak Flow --      Pain Score 03/04/23 1312 9     Pain Loc --      Pain Edu? --      Excl. in GC? --      Physical Exam Vitals and nursing note reviewed.  Constitutional:      General: She is not in acute distress.    Appearance: She is well-developed. She is obese.  HENT:     Head: Normocephalic and atraumatic.  Eyes:     Conjunctiva/sclera: Conjunctivae normal.  Cardiovascular:     Rate and Rhythm: Normal rate and regular rhythm.     Heart sounds: No murmur heard. Pulmonary:     Effort: Pulmonary effort is normal. No respiratory distress.     Breath sounds: Normal breath sounds.  Abdominal:     Palpations: Abdomen is soft. There is no mass.     Tenderness: There is abdominal tenderness in the left upper quadrant. Negative signs include Murphy's sign and McBurney's sign.  Musculoskeletal:        General: No swelling.     Cervical back: Neck supple.  Skin:    General: Skin is warm and dry.     Capillary Refill: Capillary refill takes less than 2 seconds.  Neurological:     Mental Status: She is alert.  Psychiatric:  Mood and Affect: Mood normal.       Procedure Note  Procedures  CT ABDOMEN PELVIS W CONTRAST  Final Result      Julianne Rice, MD Emergency Medicine, PGY-2   Curley Spice, MD 03/05/23 Mike Gip    Charlynne Pander, MD 03/05/23 Jerene Bears

## 2023-03-04 NOTE — ED Provider Triage Note (Signed)
Emergency Medicine Provider Triage Evaluation Note  Jade Lopez , a 27 y.o. female  was evaluated in triage.  Pt complains of left lower quadrant abdominal pain which been ongoing for 1 week.  Patient also endorses frequent episodes of diarrhea over the past week.  She denies blood in her stools.  Denies nausea, vomiting.  Patient was evaluated by primary care who thought she may have an abdominal wall hernia.  Review of Systems  Positive: As above Negative: As above  Physical Exam  BP 127/86 (BP Location: Left Arm)   Pulse (!) 109   Temp 99.5 F (37.5 C) (Oral)   Resp 16   Ht  (1.626 m)   Wt (!) 180.3 kg   SpO2 98%   BMI 68.23 kg/m  Gen:   Awake, no distress   Resp:  Normal effort  MSK:   Moves extremities without difficulty  Other:  Physical exam limited due to body habitus  Medical Decision Making  Medically screening exam initiated at 1:15 PM.  Appropriate orders placed.  Jade Lopez was informed that the remainder of the evaluation will be completed by another provider, this initial triage assessment does not replace that evaluation, and the importance of remaining in the ED until their evaluation is complete.     Darrick Grinder, PA-C 03/04/23 1316

## 2023-03-05 ENCOUNTER — Telehealth: Payer: Self-pay | Admitting: *Deleted

## 2023-03-05 ENCOUNTER — Telehealth: Payer: Self-pay | Admitting: Neurology

## 2023-03-05 ENCOUNTER — Ambulatory Visit (INDEPENDENT_AMBULATORY_CARE_PROVIDER_SITE_OTHER): Payer: BC Managed Care – PPO | Admitting: Student

## 2023-03-05 DIAGNOSIS — R109 Unspecified abdominal pain: Secondary | ICD-10-CM | POA: Diagnosis not present

## 2023-03-05 DIAGNOSIS — R1012 Left upper quadrant pain: Secondary | ICD-10-CM | POA: Diagnosis not present

## 2023-03-05 MED ORDER — NAPROXEN 500 MG PO TABS: 500.0000 mg | ORAL_TABLET | Freq: Two times a day (BID) | ORAL | 0 refills | Status: AC

## 2023-03-05 NOTE — Progress Notes (Signed)
  [facility] Internal Medicine Residency Telephone Encounter Continuity Care Appointment  HPI:  This telephone encounter was created for Ms. Jade Lopez on 03/05/2023 for the following purpose/cc LUQ abdominal pain.   Past Medical History:  Past Medical History:  Diagnosis Date   Asthma    Autoimmune disease (HCC)    Class 3 obesity (HCC)    Endometriosis    Endometritis    Fibromyalgia    Sleep apnea    T2DM (type 2 diabetes mellitus) (HCC)    TOA (tubo-ovarian abscess)      ROS:  LUQ abdominal pain   Assessment / Plan / Recommendations:  Please see A&P under problem oriented charting for assessment of the patient's acute and chronic medical conditions.  As always, pt is advised that if symptoms worsen or new symptoms arise, they should go to an urgent care facility or to to ER for further evaluation.   Consent and Medical Decision Making:  Patient discussed with Dr. Mayford Knife This is a telephone encounter between Jade Lopez and Quincy Simmonds on 03/05/2023 for LUQ abdominal pain. The visit was conducted with the patient located at home and Quincy Simmonds at [facility]. The patient's identity was confirmed using their DOB and current address. The patient has consented to being evaluated through a telephone encounter and understands the associated risks (an examination cannot be done and the patient may need to come in for an appointment) / benefits (allows the patient to remain at home, decreasing exposure to coronavirus). I personally spent 25 minutes on medical discussion.

## 2023-03-05 NOTE — Assessment & Plan Note (Signed)
Telehealth visit for continued abdominal pain.  Was seen by Dr. Melbourne Abts on 03/01/2023.  And then was also evaluated in the ED yesterday.  CBC, CMP, lipase were unremarkable.  Abdominal CT with nonpathologic prominence of lymph nodes.  Mild splenomegaly.  No other acute findings noted.  Confirmed with radiologist that there were no enhancing renal lesions.  She was prescribed cyclobenzaprine 2.5 mg daily and Tylenol she has been taking Tylenol 1000 mg without improvement.  Is having some associated diarrhea and nausea without vomiting.  No blood in the stool.  Denies any changes in diet.  Has not been taking Mounjaro in the last 3 weeks.  Otherwise no new medication changes.  Given benign workup in the ED do not think she has an acute intra-abdominal process.  Like this is abdominal wall pain.  Will continue on her on Tylenol 1000 mg 3 times a day and add on naproxen 500 mg twice daily to see if this helps with pain.  Patient asking whether or not she can increase her Flexeril.  Do think 2.5 mg is a very small dose, can take 10 mg of Flexeril at night.  Continue Tylenol 1000 mg 3 times a day Sent prescription for naproxen 500 mg twice daily for 1 week Continue Flexeril 10 mg nightly

## 2023-03-05 NOTE — Transitions of Care (Post Inpatient/ED Visit) (Unsigned)
   03/05/2023  Name: Jade Lopez MRN: [id number] DOB: Jul 16, 1996  {[id number]} Patient has continued pain.after takking prescribed meds along with Tylenol.  Given a TeleHealth appointment this afternoon.  Will follow up with scheduling of Clinic follow up appointment need after Dr. Elaina Pattee has spoken to patient.

## 2023-03-05 NOTE — Telephone Encounter (Signed)
[facility]- [facility] pending uploaded notes on the portal.

## 2023-03-08 NOTE — Progress Notes (Signed)
Internal Medicine Clinic Attending  Case discussed with the resident at the time of the visit.  We reviewed the resident's history and exam and pertinent patient test results.  I agree with the assessment, diagnosis, and plan of care documented in the resident's note.  

## 2023-03-08 NOTE — Progress Notes (Signed)
Internal Medicine Clinic Attending  Case discussed with Dr. Elaina Pattee  At the time of the visit.  We reviewed the resident's history and exam and pertinent patient test results.  I agree with the assessment, diagnosis, and plan of care documented in the resident's note. In review of chart, Jade Lopez is under care of a gynecologist at [facility] and has been seen here locally as well; she has an abnormality on recent transvag U/S which is being discussed with patient and her ordering gynecologists; it wounds as though the abnormality has been a recurrent problem though without obvious cause; meanwhile she continues to have LLQ vs pelvic pain, which is also known to her gynecologist.  A muscle relaxant had been prescribed for this problem by her gynecologist with plan for possible pelvic physical therapy.

## 2023-03-08 NOTE — Telephone Encounter (Signed)
Checked statu on the portal it is still pending.

## 2023-03-09 ENCOUNTER — Telehealth: Payer: Self-pay

## 2023-03-09 ENCOUNTER — Ambulatory Visit (INDEPENDENT_AMBULATORY_CARE_PROVIDER_SITE_OTHER): Payer: BC Managed Care – PPO | Admitting: Student

## 2023-03-09 ENCOUNTER — Other Ambulatory Visit: Payer: Self-pay

## 2023-03-09 ENCOUNTER — Encounter: Payer: Self-pay | Admitting: Internal Medicine

## 2023-03-09 VITALS — BP 129/79 | HR 107 | Wt 397.1 lb

## 2023-03-09 DIAGNOSIS — R109 Unspecified abdominal pain: Secondary | ICD-10-CM | POA: Diagnosis not present

## 2023-03-09 DIAGNOSIS — E119 Type 2 diabetes mellitus without complications: Secondary | ICD-10-CM | POA: Diagnosis not present

## 2023-03-09 DIAGNOSIS — Z7985 Long-term (current) use of injectable non-insulin antidiabetic drugs: Secondary | ICD-10-CM | POA: Diagnosis not present

## 2023-03-09 MED ORDER — SEMAGLUTIDE(0.25 OR 0.5MG/DOS) 2 MG/3ML ~~LOC~~ SOPN: PEN_INJECTOR | SUBCUTANEOUS | 1 refills | Status: DC

## 2023-03-09 MED ORDER — CYCLOBENZAPRINE HCL 10 MG PO TABS: 10.0000 mg | ORAL_TABLET | Freq: Three times a day (TID) | ORAL | 0 refills | Status: DC | PRN

## 2023-03-09 NOTE — Progress Notes (Signed)
CC: ED follow-up  HPI:  [name] is a 27 y.o. female with PMH as below who presents to clinic to follow-up after recent ED visit for abdominal pain. Please see problem based charting for evaluation, assessment and plan.  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  No acute distress. Cardiac: Tachycardic. Regular rhythm. No murmurs, rubs or gallops. No LE edema Respiratory: Lungs CTAB. No wheezing or crackles. Abdominal: Abdominal obesity. Mild tenderness to deep palpation of her L middle abdomen. Small knot appreciated with deep palpation in this area.  No rebound or guarding.  Normal bowel sounds. Skin: Warm, dry.  Some scaling rash on her nasolabial folds and around mouth Neuro: A&O x 3. Moves all extremities Psych: Appropriate mood and affect.  Vitals:   03/09/23 1017  BP: 129/79  Pulse: (!) 107  SpO2: 100%  Weight: (!) 397 lb 1.6 oz (180.1 kg)    Assessment & Plan:   Abdominal wall pain Patient presenting for follow-up after visit to the ER 5 days ago for worsening abdominal pain.  Patient describes having a throbbing, constant pain localized to the lateral aspect of her mid abdomen that started about 2 weeks ago. She denies any recent trauma to the area and states this pain is different from  her endometriosis and lupus pain. In the ER, laboratory studies and imaging did not show any acute intra-abdominal pathology.  She reports feeling a knot to the left lateral aspect of her umbilicus that I was able to appreciate with deep palpation. This could likely be an abdominal wall hernia. Her recent 2 CT A/P have shown a small fat-containing umbilical hernia.  Her pain is lateral to her umbilicus so there is a chance she also has a small abdominal wall hernia at the site of her pain.  She tells me that she was only given a few doses of Flexeril advised to take only 2.5 mg so she is completely out. I will refill her Flexeril to help with the pain/spasm and have her follow-up in a few weeks if his symptoms does not improve.  Plan: -Start Flexeril 10 mg 3 times daily as needed for muscle pain/spasm -Continue OTC Tylenol -Follow-up as needed, can consider referral to general surgery for evaluation if pain persist  Type 2 diabetes mellitus (HCC) Patient reports that she was recently started on mounjaro and after eventually getting approved and receiving 4 doses, the medication is now on national backorder. Patient was unable to tolerate metformin due to side effects. She did not fill the Jardiance due to concern about effects on her migraines and endometriosis/endometritis. Her A1c is still not at goal so I will prescribe Ozempic for better glycemic control and weight loss.  Plan: -Start Ozempic 0.25 mg weekly for 4 weeks then 0.5 mg  weekly -Discontinue Jardiance and mounjaro -Follow-up in 4 weeks for repeat A1c    See Encounters Tab for problem based charting.  Patient discussed with Dr. Lewie Chamber, MD, MPH

## 2023-03-09 NOTE — Assessment & Plan Note (Addendum)
Patient reports that she was recently started on mounjaro and after eventually getting approved and receiving 4 doses, the medication is now on national backorder. Patient was unable to tolerate metformin due to side effects. She did not fill the Jardiance due to concern about effects on her migraines and endometriosis/endometritis. Her A1c is still not at goal so I will prescribe Ozempic for better glycemic control and weight loss.  Plan: -Start Ozempic 0.25 mg weekly for 4 weeks then 0.5 mg weekly -Discontinue Jardiance and mounjaro -Follow-up in 4 weeks for repeat A1c

## 2023-03-09 NOTE — Telephone Encounter (Signed)
Prior Authorization for patient (ozempic) came through on cover my meds was submitted with last office notes awaiting approval or denial 

## 2023-03-09 NOTE — Patient Instructions (Signed)
Thank you, [name] for allowing [place] to provide your care today. Today we discussed your recent ED visit.  Based on your recent CT scan, there is no intra-abdominal cause of your pain.  You have a small umbilical hernia which could be likely contributing to the pain.  Pain is likely an abdominal muscle pain.  I have refilled her Flexeril to help with the pain.  Follow-up with the weight loss clinic when you have the means to pay for the visit. Follow-up with your OB/GYN to discuss the findings in your pelvic ultrasound  I have ordered the following medication/changed the following medications:  Flexeril 10 mg every 3 hours as needed for abdominal pain/spasm Start Ozempic 0.25 mg weekly for 4 weeks then 0.5 mg weekly  My Chart Access: [email or web address]  Please follow-up as needed or with PCP  Please make sure to arrive 15 minutes prior to your next appointment. If you arrive late, you may be asked to reschedule.    We look forward to seeing you next time. Please call our clinic at [phone] if you have any questions or concerns. The best time to call is Monday-Friday from 9am-4pm, but there is someone available 24/7. If after hours or the weekend, call the main hospital number and ask for the Internal Medicine Resident On-Call. If you need medication refills, please notify your pharmacy one week in advance and they will send [place] a request.   Thank you for letting us take part in your care. Wishing you the best!  Steffanie Rainwater, MD 03/09/2023, 11:03 AM IM Resident, PGY-3 Jade Lopez 41:10

## 2023-03-09 NOTE — Telephone Encounter (Signed)
Decision:Approved Melven Sartorius (Key: [id number]) Rx #: [id number] Ozempic (0.25 or 0.5 MG/DOSE) 2MG Ronny Bacon pen-injectors Form [employer] Electronic Prior Authorization Form (2017 NCPDP)  Message from Plan Request Reference Number: [id number]. OZEMPIC INJ 2MG /3ML is approved through 03/08/2024. For further questions, call [employer] at [phone].Marland Kitchen Authorization Expiration Date: March 08, 2024.

## 2023-03-09 NOTE — Progress Notes (Deleted)
   CC: LUQ pain  HPI:  Jade Lopez is a 27 y.o. with medical history of DMII, Class III Obesity, Asthma, endometriosis presenting to [facility] for LUQ pain and question regarding her splenomegaly.   Please see problem-based list for further details, assessments, and plans.  Past Medical History:  Diagnosis Date   Asthma    Autoimmune disease (HCC)    Class 3 obesity (HCC)    Endometriosis    Endometritis    Fibromyalgia    Sleep apnea    T2DM (type 2 diabetes mellitus) (HCC)    TOA (tubo-ovarian abscess)     Current Outpatient Medications (Endocrine & Metabolic):    JARDIANCE 10 MG TABS tablet, TAKE 1 TABLET(10 MG) BY MOUTH DAILY   medroxyPROGESTERone (DEPO-PROVERA) 150 MG/ML injection, Inject 1 mL every 3 months by intramuscular route.   norethindrone (AYGESTIN) 5 MG tablet, Take 5 mg by mouth daily.   ORILISSA 150 MG TABS, Take 1 tablet by mouth daily.   tirzepatide [facility]) 2.5 MG/0.5ML Pen, Inject 2.5 mg into the skin once a week.   Current Outpatient Medications (Respiratory):    albuterol (ACCUNEB) 1.25 MG/3ML nebulizer solution, Take 1 ampule by nebulization every 6 (six) hours as needed for wheezing.   albuterol (VENTOLIN HFA) 108 (90 Base) MCG/ACT inhaler, Inhale into the lungs every 6 (six) hours as needed for wheezing or shortness of breath.  Current Outpatient Medications (Analgesics):    naproxen (NAPROSYN) 500 MG tablet, Take 1 tablet (500 mg total) by mouth 2 (two) times daily with a meal for 7 days.   Current Outpatient Medications (Other):    Blood Glucose Monitoring Suppl (MM EASY TOUCH GLUCOSE METER) w/Device KIT, Please check your blood sugar daily, in the morning.   busPIRone (BUSPAR) 10 MG tablet, Take 3 tablets (30 mg total) by mouth daily.   cyclobenzaprine (FLEXERIL) 10 MG tablet, Take 0.5 tablets (5 mg total) by mouth 2 (two) times daily as needed for up to 10 doses for muscle spasms.   diclofenac Sodium (VOLTAREN) 1 % GEL, Apply 4 g topically 4  (four) times daily.   pregabalin (LYRICA) 50 MG capsule, Take 1 capsule (50 mg total) by mouth 3 (three) times daily.   Vitamin D, Ergocalciferol, (DRISDOL) 1.25 MG (50000 UNIT) CAPS capsule, TAKE 1 CAPSULE BY MOUTH EVERY 7 DAYS  Review of Systems:  Review of system negative unless stated in the problem list or HPI.    Physical Exam:  There were no vitals filed for this visit. Physical Exam General: NAD HENT: NCAT Lungs: CTAB, no wheeze, rhonchi or rales.  Cardiovascular: Normal heart sounds, no r/m/g, 2+ pulses in all extremities. No LE edema Abdomen: No TTP, normal bowel sounds MSK: No asymmetry or muscle atrophy.  Skin: no lesions noted on exposed skin Neuro: Alert and oriented x4. CN grossly intact Psych: Normal mood and normal affect   Assessment & Plan:   No problem-specific Assessment & Plan notes found for this encounter.   See Encounters Tab for problem based charting.  Patient Discussed with Dr. {NAMES:[id number]::"Guilloud","Hoffman","Mullen","Narendra","Vincent","Machen","Lau","Hatcher","Williams"} Gwenevere Abbot, MD Eligha Bridegroom. [facility] Internal Medicine Residency, PGY-2

## 2023-03-09 NOTE — Assessment & Plan Note (Signed)
Patient presenting for follow-up after visit to the ER 5 days ago for worsening abdominal pain.  Patient describes having a throbbing, constant pain localized to the lateral aspect of her mid abdomen that started about 2 weeks ago. She denies any recent trauma to the area and states this pain is different from her endometriosis and lupus pain. In the ER, laboratory studies and imaging did not show any acute intra-abdominal pathology.  She reports feeling a knot to the left lateral aspect of her umbilicus that I was able to appreciate with deep palpation. This could likely be an abdominal wall hernia. Her recent 2 CT A/P have shown a small fat-containing umbilical hernia.  Her pain is lateral to her umbilicus so there is a chance she also has a small abdominal wall hernia at the site of her pain.  She tells me that she was only given a few doses of Flexeril advised to take only 2.5 mg so she is completely out. I will refill her Flexeril to help with the pain/spasm and have her follow-up in a few weeks if his symptoms does not improve.  Plan: -Start Flexeril 10 mg 3 times daily as needed for muscle pain/spasm -Continue OTC Tylenol -Follow-up as needed, can consider referral to general surgery for evaluation if pain persist

## 2023-03-11 ENCOUNTER — Other Ambulatory Visit: Payer: Self-pay

## 2023-03-11 ENCOUNTER — Encounter: Payer: Self-pay | Admitting: Obstetrics and Gynecology

## 2023-03-11 ENCOUNTER — Ambulatory Visit (INDEPENDENT_AMBULATORY_CARE_PROVIDER_SITE_OTHER): Payer: BC Managed Care – PPO | Admitting: Obstetrics and Gynecology

## 2023-03-11 ENCOUNTER — Telehealth: Payer: Self-pay | Admitting: Student

## 2023-03-11 VITALS — BP 109/71 | HR 105 | Ht 64.5 in | Wt 393.3 lb

## 2023-03-11 DIAGNOSIS — M329 Systemic lupus erythematosus, unspecified: Secondary | ICD-10-CM | POA: Diagnosis not present

## 2023-03-11 DIAGNOSIS — N6341 Unspecified lump in right breast, subareolar: Secondary | ICD-10-CM | POA: Diagnosis not present

## 2023-03-11 DIAGNOSIS — N939 Abnormal uterine and vaginal bleeding, unspecified: Secondary | ICD-10-CM

## 2023-03-11 NOTE — Telephone Encounter (Signed)
Rec'd call from the pt who was seen on 03/09/2023.  Pt states she was to have a Referral placed to the General Surgeon for her abdominal pain(hernia).  Made pt aware she was to f/u with [facility] per her AVS that was given to her.  The pt is requesting to not wait to be seen to f/u.   Does this patient require a F/u visit before a Referral can be placed?

## 2023-03-11 NOTE — Progress Notes (Signed)
GYNECOLOGY VISIT  Patient name: Jade Lopez MRN [id number]  Date of birth: 1996/06/14 Chief Complaint:   GYN Visit  History:  Jade Lopez is a 27 y.o. No obstetric history on file. being seen today for follow up of pelvic ultrasound and breast mass/nodule deep to her nipple  Having nipple mass of some sort. Pushing agains her nipple and still hurts. Went away when admitted and has returned and has gone away . No discharge. Not sure if she wants to have procedure done with [facility] or [facility]- they have her records from her prior hospitalizations. Not sure why infection keeps returning. Would like to have children in the future. Reports that prior hysteroscopy was told just fluid within her uterus.   Concerned about possible lupus. Was once diagnosed but reports not being able to be "re-diagnosed" with lupus and wondering if Gyn issues related to possible lupus.   Past Medical History:  Diagnosis Date   Asthma    Autoimmune disease (HCC)    Class 3 obesity (HCC)    Endometriosis    Endometritis    Fibromyalgia    Sleep apnea    Sleep related headaches 02/24/2023   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   T2DM (type 2 diabetes mellitus) (HCC)    TOA (tubo-ovarian abscess)     Past Surgical History:  Procedure Laterality Date   CHOLECYSTECTOMY     DILATATION AND CURETTAGE/HYSTEROSCOPY WITH MINERVA     TONSILLECTOMY     TONSILLECTOMY AND ADENOIDECTOMY      The following portions of the patient's history were reviewed and updated as appropriate: allergies, current medications, past family history, past medical history, past social history, past surgical history and problem list.   Health Maintenance:   Last pap  Last mammogram: n/a   Review of Systems:  Pertinent items are noted in HPI. Comprehensive review of systems was otherwise negative.   Objective:  Physical Exam BP 109/71 (BP Location: Left Arm, Patient Position: Sitting, Cuff Size: Large)   Pulse (!) 105   Ht 5' 4.5" (1.638  m)   Wt (!) 393 lb 4.8 oz (178.4 kg)   SpO2 100%   BMI 66.47 kg/m    Physical Exam Vitals and nursing note reviewed. Exam conducted with a chaperone present.  Constitutional:      Appearance: Normal appearance.  HENT:     Head: Normocephalic and atraumatic.  Pulmonary:     Effort: Pulmonary effort is normal.  Chest:     Comments: 2cm firm nodule deep to right nipple Bilateral piercings noted without erythema or drainage Skin:    General: Skin is warm and dry.  Neurological:     General: No focal deficit present.     Mental Status: She is alert.  Psychiatric:        Mood and Affect: Mood normal.        Behavior: Behavior normal.        Thought Content: Thought content normal.        Judgment: Judgment normal.     Labs and Imaging Narrative & Impression  CLINICAL DATA:  Abnormal uterine bleeding for a year   EXAM: TRANSABDOMINAL AND TRANSVAGINAL ULTRASOUND OF PELVIS   TECHNIQUE: Both transabdominal and transvaginal ultrasound examinations of the pelvis were performed. Transabdominal technique was performed for global imaging of the pelvis including uterus, ovaries, adnexal regions, and pelvic cul-de-sac. It was necessary to proceed with endovaginal exam following the transabdominal exam to visualize the endometrium and ovaries.   COMPARISON:  None Available.   FINDINGS: Uterus   Measurements: 7.2 x 4.5 x 3.4 cm = volume: 59 mL. No fibroids or other mass visualized.   Endometrium   Thickness: 4 mm. There appears to be a 5 x 2.5 x 2.6 cm mass located within the endometrial canal, splaying the endometrium.   Right ovary   Not visualized.   Left ovary   Not visualized.   Other findings   No abnormal free fluid.   IMPRESSION: 1. A hypoechoic masslike region appears to be centered in the endometrial canal measuring 5 x 2.5 x 2.6 cm, splaying the endometrium. This could represent a neoplasm. A complex fluid collection such as blood products is  possible. Recommend gynecologic consultation and history of sonography and possible biopsy. 2. Neither ovary was visualized.   These results will be called to the ordering clinician or representative by the Radiologist Assistant, and communication documented in the PACS or [employer].         Assessment & Plan:   1. Abnormal uterine bleeding (AUB) Reviewed [place] results and recommendation for hysteroscopic removal and sampling. Unclear why she has had recurrent infections. If she undergoes hysteroscopy, requesting postop observation/admission and preop ABX and reviewed that without seeing prior hospitalization records and pathology reports, would not plan for postop admission or antibiotics. It is possible that there is small nidus of infection not completely cleared causing recurrent infections but without knowing micro report from prior procedures, unclear.  Reports that [facility] has all of her records from prior admission and surgeries. Noted that [place] should be available to [facility] provider via CareEverywhere but if that images requested, may take time. Additionally, noted that coordination between hospital systems can be difficult and may slow down provision of care.   2. Lupus (HCC) Requested sed rate and CRP due to concerns for possible elevation. Feels that she was diagnosed with lupus once and unable to be "re-diagnosed".  - Sedimentation rate - C-reactive protein  3. Subareolar mass of right breast Breast US ordered for subareolar nodule/mass  - [place] LIMITED ULTRASOUND INCLUDING AXILLA RIGHT BREAST; Future    Lorriane Shire, MD Minimally Invasive Gynecologic Surgery Center for [facility], [facility]

## 2023-03-11 NOTE — Telephone Encounter (Signed)
I spoke with her. She does not need a referral to surgery right now. I have told her to follow up if her pain does not improve then we can consider an ultrasound and if we found a hernia at the site of her pain then we can referral her to surgery.

## 2023-03-12 ENCOUNTER — Other Ambulatory Visit (HOSPITAL_COMMUNITY): Payer: Self-pay

## 2023-03-12 LAB — C-REACTIVE PROTEIN: CRP: 46 mg/L — ABNORMAL HIGH (ref 0–10)

## 2023-03-12 LAB — SEDIMENTATION RATE: Sed Rate: 58 mm/hr — ABNORMAL HIGH (ref 0–32)

## 2023-03-12 NOTE — Progress Notes (Signed)
Internal Medicine Clinic Attending  Case discussed with the resident at the time of the visit.  We reviewed the resident's history and exam and pertinent patient test results.  I agree with the assessment, diagnosis, and plan of care documented in the resident's note.  

## 2023-03-14 ENCOUNTER — Emergency Department (HOSPITAL_COMMUNITY)
Admission: EM | Admit: 2023-03-14 | Discharge: 2023-03-15 | Discharge status: Against Medical Advice | Payer: BC Managed Care – PPO | Attending: Emergency Medicine | Admitting: Emergency Medicine

## 2023-03-14 ENCOUNTER — Encounter (HOSPITAL_COMMUNITY): Payer: Self-pay

## 2023-03-14 ENCOUNTER — Ambulatory Visit (HOSPITAL_COMMUNITY)
Admission: EM | Admit: 2023-03-14 | Discharge: 2023-03-14 | Disposition: A | Discharge status: Home / Self Care | Payer: BC Managed Care – PPO

## 2023-03-14 ENCOUNTER — Other Ambulatory Visit: Payer: Self-pay

## 2023-03-14 DIAGNOSIS — N611 Abscess of the breast and nipple: Secondary | ICD-10-CM | POA: Diagnosis not present

## 2023-03-14 DIAGNOSIS — Z7985 Long-term (current) use of injectable non-insulin antidiabetic drugs: Secondary | ICD-10-CM | POA: Insufficient documentation

## 2023-03-14 DIAGNOSIS — Z5321 Procedure and treatment not carried out due to patient leaving prior to being seen by health care provider: Secondary | ICD-10-CM | POA: Diagnosis not present

## 2023-03-14 DIAGNOSIS — E119 Type 2 diabetes mellitus without complications: Secondary | ICD-10-CM | POA: Diagnosis not present

## 2023-03-14 NOTE — ED Provider Triage Note (Signed)
Emergency Medicine Provider Triage Evaluation Note  Jade Lopez , a 27 y.o. female  was evaluated in triage.  Pt complains of right breast abscess for the last week.  Seen by OB/GYN with ultrasound scheduled for Friday.  No recent antibiotics.  States she has intermittent fevers chronically.  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  Not currently breast-feeding.  Review of Systems  Positive: See HPI Negative: See HPI  Physical Exam  BP (!) 158/86 (BP Location: Right Arm)   Pulse (!) 124   Temp 98.3 F (36.8 C) (Oral)   Resp 18   Wt (!) 178.3 kg   SpO2 99%   BMI 66.42 kg/m  Gen:   Awake, no distress   Resp:  Normal effort  MSK:   Moves extremities without difficulty  Other:  Increased warmth to the right breast diffusely with area of induration to the medial aspect of the areola, surrounding erythema  Medical Decision Making  Medically screening exam initiated at 6:12 PM.  Appropriate orders placed.  Tata Rebar was informed that the remainder of the evaluation will be completed by another provider, this initial triage assessment does not replace that evaluation, and the importance of remaining in the ED until their evaluation is complete.     Tonette Lederer, PA-C 03/14/23 1813

## 2023-03-14 NOTE — ED Triage Notes (Signed)
Pt arrives with c/o right breast pain that started about a week ago. Per pt, she has an area near her nipple that is red, swollen, and painful. Per pt, she has an [place] appt scheduled for Friday. Pt denies fevers.

## 2023-03-14 NOTE — ED Notes (Signed)
Unable to obtain labs at this time due to pt being a difficulty stick. Per pt, they usually have to use an [place].

## 2023-03-15 ENCOUNTER — Encounter: Payer: Self-pay | Admitting: Family Medicine

## 2023-03-15 ENCOUNTER — Telehealth: Payer: Self-pay

## 2023-03-15 ENCOUNTER — Encounter: Payer: Medicaid Other | Admitting: Student

## 2023-03-15 ENCOUNTER — Encounter (HOSPITAL_COMMUNITY): Payer: Self-pay

## 2023-03-15 ENCOUNTER — Other Ambulatory Visit: Payer: Self-pay

## 2023-03-15 ENCOUNTER — Ambulatory Visit (INDEPENDENT_AMBULATORY_CARE_PROVIDER_SITE_OTHER): Payer: BC Managed Care – PPO | Admitting: Family Medicine

## 2023-03-15 ENCOUNTER — Other Ambulatory Visit (HOSPITAL_COMMUNITY): Payer: Self-pay

## 2023-03-15 ENCOUNTER — Encounter: Payer: Self-pay | Admitting: Obstetrics and Gynecology

## 2023-03-15 ENCOUNTER — Emergency Department (HOSPITAL_COMMUNITY)
Admission: EM | Admit: 2023-03-15 | Discharge: 2023-03-15 | Disposition: A | Discharge status: Home / Self Care | Payer: BC Managed Care – PPO | Source: Home / Self Care | Attending: Emergency Medicine | Admitting: Emergency Medicine

## 2023-03-15 VITALS — BP 138/86 | HR 102 | Temp 98.6°F | Ht 64.5 in | Wt 393.6 lb

## 2023-03-15 DIAGNOSIS — N611 Abscess of the breast and nipple: Secondary | ICD-10-CM

## 2023-03-15 DIAGNOSIS — E119 Type 2 diabetes mellitus without complications: Secondary | ICD-10-CM | POA: Insufficient documentation

## 2023-03-15 MED ORDER — SULFAMETHOXAZOLE-TRIMETHOPRIM 800-160 MG PO TABS
1.0000 | ORAL_TABLET | Freq: Two times a day (BID) | ORAL | 0 refills | Status: DC
Start: 2023-03-15 — End: 2023-04-06

## 2023-03-15 MED ORDER — HYDROCODONE-ACETAMINOPHEN 5-325 MG PO TABS
1.0000 | ORAL_TABLET | Freq: Once | ORAL | Status: AC
  Administered 2023-03-15: 1 via ORAL
  Filled 2023-03-15: qty 1

## 2023-03-15 MED ORDER — AMOXICILLIN-POT CLAVULANATE 875-125 MG PO TABS: 1.0000 | ORAL_TABLET | Freq: Two times a day (BID) | ORAL | 0 refills | Status: DC

## 2023-03-15 MED ORDER — AMOXICILLIN-POT CLAVULANATE 875-125 MG PO TABS
1.0000 | ORAL_TABLET | Freq: Once | ORAL | Status: AC
  Administered 2023-03-15: 1 via ORAL
  Filled 2023-03-15: qty 1

## 2023-03-15 NOTE — Telephone Encounter (Signed)
A Prior Authorization was initiated for this patients [employer] through Cover[employer]eds ([employer]).   Key: [id number]

## 2023-03-15 NOTE — ED Triage Notes (Signed)
Pt. Arrives POV c/o a growth to her R. Breast near her nipple. She noticed it about a week and a half ago. She states that it has been swelling and the pain has gotten worse. She is scheduled to have an [place] Friday.

## 2023-03-15 NOTE — ED Provider Notes (Signed)
[place] EMERGENCY DEPARTMENT AT [facility] Provider Note   CSN: [id number] Arrival date & time: 03/15/23  0222     History  Chief Complaint  Patient presents with   Abscess    Jade Lopez is a 27 y.o. female.  The history is provided by the patient.  Abscess Jade Lopez is a 27 y.o. female who presents to the Emergency Department complaining of chest pain.  She presents to the emergency department for evaluation of swelling, pain and redness to her right nipple that has been going on for about 1-1/2 weeks.  She also reports intermittent subjective fevers at home for the last week and a half but has a history of same in the past.  She has a history of possible lupus, diabetes.  She is scheduled for an ultrasound at the breast center on Thursday.  She presents today due to worsening pain. Denies any chance of pregnancy.  She has not been sexually active for at least 1 year.     Home Medications Prior to Admission medications   Medication Sig Start Date End Date Taking? Authorizing Provider  amoxicillin-clavulanate (AUGMENTIN) 875-125 MG tablet Take 1 tablet by mouth every 12 (twelve) hours. 03/15/23  Yes Tilden Fossa, MD  albuterol (ACCUNEB) 1.25 MG/3ML nebulizer solution Take 1 ampule by nebulization every 6 (six) hours as needed for wheezing.    [provider]  albuterol (VENTOLIN HFA) 108 (90 Base) MCG/ACT inhaler Inhale into the lungs every 6 (six) hours as needed for wheezing or shortness of breath.    [provider]  Blood Glucose Monitoring Suppl (MM EASY TOUCH GLUCOSE METER) w/Device KIT Please check your blood sugar daily, in the morning. 12/08/22   Marolyn Haller, MD  busPIRone (BUSPAR) 10 MG tablet Take 3 tablets (30 mg total) by mouth daily. 11/25/22   Marolyn Haller, MD  cyclobenzaprine (FLEXERIL) 10 MG tablet Take 1 tablet (10 mg total) by mouth 3 (three) times daily as needed for muscle spasms. 03/09/23   Steffanie Rainwater, MD   diclofenac Sodium (VOLTAREN) 1 % GEL Apply 4 g topically 4 (four) times daily. Patient not taking: Reported on 03/11/2023 03/01/23   Adron Bene, MD  medroxyPROGESTERone (DEPO-PROVERA) 150 MG/ML injection Inject 1 mL every 3 months by intramuscular route.    [provider]  norethindrone (AYGESTIN) 5 MG tablet Take 5 mg by mouth daily. 12/11/22   [provider]  ORILISSA 150 MG TABS Take 1 tablet by mouth daily. 12/12/22   [provider]  pregabalin (LYRICA) 50 MG capsule Take 1 capsule (50 mg total) by mouth 3 (three) times daily. 01/12/23   Marolyn Haller, MD  Semaglutide,0.25 or 0.5MG /DOS, 2 MG/3ML SOPN Inject 0.25 mg weekly for 4 weeks then 0.5 mg weekly 03/09/23   Steffanie Rainwater, MD  Vitamin D, Ergocalciferol, (DRISDOL) 1.25 MG (50000 UNIT) CAPS capsule TAKE 1 CAPSULE BY MOUTH EVERY 7 DAYS Patient not taking: Reported on 03/11/2023 02/19/23   Marolyn Haller, MD      Allergies    Clindamycin/lincomycin, Gabapentin, Other, Chlorpheniramine-phenylephrine, and Pregabalin    Review of Systems   Review of Systems  All other systems reviewed and are negative.   Physical Exam Updated Vital Signs BP (!) 156/97 (BP Location: Left Wrist)   Pulse (!) 111   Temp 98.2 F (36.8 C)   Resp 19   Ht 5' 4.5" (1.638 m)   Wt (!) 178.3 kg   SpO2 100%   BMI 66.42 kg/m  Physical Exam Vitals and nursing note reviewed.  Constitutional:      Appearance: She is well-developed.  HENT:     Head: Normocephalic and atraumatic.  Cardiovascular:     Rate and Rhythm: Normal rate and regular rhythm.     Heart sounds: No murmur heard. Pulmonary:     Effort: Pulmonary effort is normal. No respiratory distress.     Breath sounds: Normal breath sounds.     Comments: There is approximately 4 cm of erythema, edema, induration and tenderness over and surrounding the right nipple Skin:    General: Skin is warm and dry.  Neurological:     Mental Status: She is alert and  oriented to person, place, and time.  Psychiatric:        Behavior: Behavior normal.     ED Results / Procedures / Treatments   Labs (all labs ordered are listed, but only abnormal results are displayed) Labs Reviewed - No data to display  EKG None  Radiology No results found.  Procedures Procedures    Medications Ordered in ED Medications  amoxicillin-clavulanate (AUGMENTIN) 875-125 MG per tablet 1 tablet (has no administration in time range)  HYDROcodone-acetaminophen (NORCO/VICODIN) 5-325 MG per tablet 1 tablet (has no administration in time range)    ED Course/ Medical Decision Making/ A&P                             Medical Decision Making Risk Prescription drug management.   Patient with history of diabetes, possible lupus here for evaluation of right breast pain and swelling.  She denies any chance of pregnancy.  On examination she has erythema, tenderness and fullness just posterior and surrounding the nipple.  Concern is for cellulitis, cannot rule out deep abscess.  She is not toxic on evaluation.  Will start on antibiotics with plan for follow-up in the breast center as already scheduled.        Final Clinical Impression(s) / ED Diagnoses Final diagnoses:  Breast abscess    Rx / DC Orders ED Discharge Orders          Ordered    amoxicillin-clavulanate (AUGMENTIN) 875-125 MG tablet  Every 12 hours        03/15/23 0331              Tilden Fossa, MD 03/15/23 [phone]

## 2023-03-15 NOTE — Progress Notes (Unsigned)
GYNECOLOGY PROBLEM  VISIT ENCOUNTER NOTE  Subjective:   Jade Lopez is a 27 y.o. G0P0000 female here for a problem GYN visit.  Current complaints: breast abscess.     Present for >1 week. See by Dr. Gus Height who noted the increased pain and erythema on the right aerola. She ordered a breast US on 5/2 and it is scheduled for 5/9.  Patient presented to the ER due to worsening pain and redness. On 5/6 in the early AM (0222). And was given augmentin but no imaging was obtained due to lack of a radiology tech certified in breast imaging. Reports subjective fever and chills for 3 weeks. Not worse with this breast complaint. She does not have a thermometer at home. She reports since seen at the ER ~18 hrs ago pain is worse and redness is worse. She has not yet picked up the antibiotic. She has pelvic complaints but these are undergoing evaluation separately. She is being seen today solely for the breast concern after she wrote into our pool and sent images of her right breast.   Does have  bilateral nipple piercings, removed right due to pain.   Gynecologic History No LMP recorded (lmp unknown). Patient has had an injection.  Contraception: Depo-Provera injections  Health Maintenance Due  Topic Date Due   COVID-19 Vaccine (1) Never done   FOOT EXAM  Never done   OPHTHALMOLOGY EXAM  Never done   HPV VACCINES (1 - 2-dose series) Never done   HIV Screening  Never done   Hepatitis C Screening  Never done   PAP-Cervical Cytology Screening  Never done   PAP SMEAR-Modifier  Never done    The following portions of the patient's history were reviewed and updated as appropriate: allergies, current medications, past family history, past medical history, past social history, past surgical history and problem list.  Review of Systems Pertinent items are noted in HPI.   Objective:  BP 138/86   Pulse (!) 102   Temp 98.6 F (37 C) (Temporal)   Ht 5' 4.5" (1.638 m)   Wt (!) 393 lb 9.6 oz (178.5  kg)   LMP  (LMP Unknown) Comment: some time in february, lasted about 2 weeks  BMI 66.52 kg/m  Gen: well appearing, NAD. Uncomfortable walking -- holding and supporting her right breast.  HEENT: no scleral icterus CV: RR Lung: Normal WOB Ext: warm well perfused  Chest: Right breast with marked area of tenderness that is retroaerolar.        Assessment and Plan:   1. Breast abscess Worsening cellulitis  Will start SMZ-TMP for empiric MRSA coverage  Area marked, given precautions of fever, chills that are worsening, erythema spreading beyond the marked area. This should prompt ER evaluation and possible IV abx treatment.  Of note patient became septic after an endometrial sampling.  Stressed importance of ABX and imaging on 5/9 Attempted to make sooner appt for the patient and RN [facility] and Diagnostic radiology, both of which cannot see the patient and sooner.  - sulfamethoxazole-trimethoprim (BACTRIM DS) 800-160 MG tablet; Take 1 tablet by mouth 2 (two) times daily.  Dispense: 20 tablet; Refill: 0  Please refer to After Visit Summary for other counseling recommendations.   No follow-ups on file.  Future Appointments  Date Time Provider Department Center  03/18/2023 12:40 PM GI-[facility]G [place] 2 GI-[facility] GI-BREAST CE  03/29/2023  3:30 PM IMP[facility] NURSE IMP[facility]  03/31/2023  2:15 PM Marolyn Haller, MD IMP[facility]  Caren Macadam, MD, MPH, ABFM Attending [place] for [facility]

## 2023-03-16 ENCOUNTER — Inpatient Hospital Stay (HOSPITAL_COMMUNITY): Payer: BC Managed Care – PPO

## 2023-03-16 ENCOUNTER — Encounter: Payer: Self-pay | Admitting: Family Medicine

## 2023-03-16 ENCOUNTER — Inpatient Hospital Stay (HOSPITAL_COMMUNITY)
Admission: AD | Admit: 2023-03-16 | Discharge: 2023-03-16 | Disposition: A | Discharge status: Home / Self Care | Payer: BC Managed Care – PPO | Source: Ambulatory Visit | Attending: Obstetrics and Gynecology | Admitting: Obstetrics and Gynecology

## 2023-03-16 ENCOUNTER — Other Ambulatory Visit: Payer: Self-pay

## 2023-03-16 DIAGNOSIS — N6489 Other specified disorders of breast: Secondary | ICD-10-CM | POA: Diagnosis not present

## 2023-03-16 DIAGNOSIS — N611 Abscess of the breast and nipple: Secondary | ICD-10-CM | POA: Insufficient documentation

## 2023-03-16 LAB — SEDIMENTATION RATE: Sed Rate: 47 mm/hr — ABNORMAL HIGH (ref 0–22)

## 2023-03-16 LAB — CBC
HCT: 41.4 % (ref 36.0–46.0)
Hemoglobin: 12.3 g/dL (ref 12.0–15.0)
MCH: 24.8 pg — ABNORMAL LOW (ref 26.0–34.0)
MCHC: 29.7 g/dL — ABNORMAL LOW (ref 30.0–36.0)
MCV: 83.5 fL (ref 80.0–100.0)
Platelets: 306 K/uL (ref 150–400)
RBC: 4.96 MIL/uL (ref 3.87–5.11)
RDW: 14.8 % (ref 11.5–15.5)
WBC: 9 K/uL (ref 4.0–10.5)
nRBC: 0 % (ref 0.0–0.2)

## 2023-03-16 LAB — COMPREHENSIVE METABOLIC PANEL WITH GFR
ALT: 14 U/L (ref 0–44)
AST: 13 U/L — ABNORMAL LOW (ref 15–41)
Albumin: 3.1 g/dL — ABNORMAL LOW (ref 3.5–5.0)
Alkaline Phosphatase: 74 U/L (ref 38–126)
Anion gap: 10 (ref 5–15)
BUN: <5 mg/dL — ABNORMAL LOW (ref 6–20)
CO2: 22 mmol/L (ref 22–32)
Calcium: 8.8 mg/dL — ABNORMAL LOW (ref 8.9–10.3)
Chloride: 105 mmol/L (ref 98–111)
Creatinine, Ser: 0.74 mg/dL (ref 0.44–1.00)
GFR, Estimated: >60 mL/min
Glucose, Bld: 148 mg/dL — ABNORMAL HIGH (ref 70–99)
Potassium: 3.9 mmol/L (ref 3.5–5.1)
Sodium: 137 mmol/L (ref 135–145)
Total Bilirubin: 0.5 mg/dL (ref 0.3–1.2)
Total Protein: 6.9 g/dL (ref 6.5–8.1)

## 2023-03-16 LAB — LACTIC ACID, PLASMA: Lactic Acid, Venous: 1.4 mmol/L (ref 0.5–1.9)

## 2023-03-16 LAB — C-REACTIVE PROTEIN: CRP: 7.1 mg/dL — ABNORMAL HIGH

## 2023-03-16 LAB — POCT PREGNANCY, URINE: Preg Test, Ur: NEGATIVE

## 2023-03-16 NOTE — MAU Note (Signed)
Jade Lopez is a 27 y.o. at Unknown here in MAU reporting: pain in right breast that began that 1.5 weeks ago.  States has a knot behind and around her nipple that's gotten larger.  Denies drainage from nipple, reports area is red, hot to touch and "hurts".  Previously seen for same issue, currently on antibiotics.   Reports took pain medication yesterday. LMP: NA Onset of complaint: 1.5 weeks Pain score: 9 Vitals:   03/16/23 1031  BP: 136/88  Pulse: (!) 108  Resp: 20  Temp: 98.2 F (36.8 C)  SpO2: 99%     FHT:NA Lab orders placed from triage:   UPT

## 2023-03-16 NOTE — MAU Provider Note (Signed)
History     CSN: [id number]  Arrival date and time: 03/16/23 4098   Event Date/Time   First Provider Initiated Contact with Patient 03/16/23 1048      Chief Complaint  Patient presents with   Right Breast   HPI Jade Lopez is a [id number] presenting with worsening breast redness and pain. Episode started 1.5 weeks ago. She [facility] and Dr. Asencion Gowda noted a small area of pain and redness on the right aerola. Hermoine reports the pain has worsened since that time. She was seen in the ER on 5/5 to 5/6 for this same issues. They started Augmentin but were unable to get imaging at that time. The patient was scheduled (from the 5/2 visit) for an [place] on 5/9. She was seen in clinic on 5/6 late afternoon due to increased redness and pain. Borders of the cellulitis were marked at that time and therapy was changed to SMZ-TMP . The patient reports the redness was spreading after that visit but she wanted to see what the medication would do. She took SMP-TMZ last night and woke up this morning with even worse pain and redness that extends beyond the the upper border of marked area. She tried warm compresses last night.   She has had issues with her right nipple/breast previous to this episode but this is the worst  Patient had bilateral nipple piercing but has removed the right x1 week due to pain. She also has a history of sepsis after hyseroscopy. Contributing to her risk for worsening infection would also be her diagnosis of lupus vs other autoimmune process, T2DM, Super morbid obesity  Past Medical History:  Diagnosis Date   Asthma    Autoimmune disease (HCC)    Blood clots in brain 2020   Class 3 obesity (HCC)    Endometriosis    Endometritis    Fibromyalgia    Sleep apnea    Sleep related headaches 02/24/2023   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   T2DM (type 2 diabetes mellitus) (HCC)    TOA (tubo-ovarian abscess)     Past Surgical History:  Procedure Laterality Date    CHOLECYSTECTOMY     DILATATION AND CURETTAGE/HYSTEROSCOPY WITH MINERVA     3 total   TONSILLECTOMY     TONSILLECTOMY AND ADENOIDECTOMY      Family History  Problem Relation Age of Onset   Diabetes Mother    Hypertension Mother    Diabetes Father    Hypertension Father     Social History   Tobacco Use   Smoking status: Never   Smokeless tobacco: Never  Vaping Use   Vaping Use: Never used  Substance Use Topics   Alcohol use: Yes    Comment: occ   Drug use: No    Allergies:  Allergies  Allergen Reactions   Clindamycin/Lincomycin Other (See Comments), Hives, Palpitations, Rash and Swelling    Other Reaction(s): Racing heart beat   Gabapentin Hives, Nausea And Vomiting and Palpitations    Other Reaction(s): Unknown   Other Anaphylaxis    Allergic to Rynatan cough syrup.   Chlorpheniramine-Phenylephrine Nausea And Vomiting   Pregabalin Rash    Allergic to the generic     Medications Prior to Admission  Medication Sig Dispense Refill Last Dose   albuterol (ACCUNEB) 1.25 MG/3ML nebulizer solution Take 1 ampule by nebulization every 6 (six) hours as needed for wheezing. (Patient not taking: Reported on 03/15/2023)      albuterol (VENTOLIN HFA) 108 (90 Base)  MCG/ACT inhaler Inhale into the lungs every 6 (six) hours as needed for wheezing or shortness of breath.      amoxicillin-clavulanate (AUGMENTIN) 875-125 MG tablet Take 1 tablet by mouth every 12 (twelve) hours. (Patient not taking: Reported on 03/15/2023) 14 tablet 0    Blood Glucose Monitoring Suppl (MM EASY TOUCH GLUCOSE METER) w/Device KIT Please check your blood sugar daily, in the morning. 1 kit 0    busPIRone (BUSPAR) 10 MG tablet Take 3 tablets (30 mg total) by mouth daily. (Patient not taking: Reported on 03/15/2023) 30 tablet 5    cyclobenzaprine (FLEXERIL) 10 MG tablet Take 1 tablet (10 mg total) by mouth 3 (three) times daily as needed for muscle spasms. 15 tablet 0    medroxyPROGESTERone (DEPO-PROVERA) 150 MG/ML  injection Inject 1 mL every 3 months by intramuscular route.      norethindrone (AYGESTIN) 5 MG tablet Take 5 mg by mouth daily.      ORILISSA 150 MG TABS Take 1 tablet by mouth daily. (Patient not taking: Reported on 03/15/2023)      pregabalin (LYRICA) 50 MG capsule Take 1 capsule (50 mg total) by mouth 3 (three) times daily. 90 capsule 3    Semaglutide,0.25 or 0.5MG /DOS, 2 MG/3ML SOPN Inject 0.25 mg weekly for 4 weeks then 0.5 mg weekly (Patient not taking: Reported on 03/15/2023) 3 mL 1    sulfamethoxazole-trimethoprim (BACTRIM DS) 800-160 MG tablet Take 1 tablet by mouth 2 (two) times daily. 20 tablet 0    Vitamin D, Ergocalciferol, (DRISDOL) 1.25 MG (50000 UNIT) CAPS capsule TAKE 1 CAPSULE BY MOUTH EVERY 7 DAYS (Patient not taking: Reported on 03/11/2023) 8 capsule 0     Review of Systems  Constitutional:  Positive for chills and fever (subjective at home).  HENT:  Negative for congestion and sore throat.   Eyes:  Negative for pain and visual disturbance.  Respiratory:  Negative for cough, chest tightness and shortness of breath.   Cardiovascular:  Negative for chest pain.  Gastrointestinal:  Positive for abdominal pain (unrelated to current breast complaint and this is chronic). Negative for diarrhea, nausea and vomiting.  Endocrine: Negative for cold intolerance and heat intolerance.  Genitourinary:  Negative for dysuria and flank pain.  Musculoskeletal:  Negative for back pain.  Skin:  Negative for rash.  Allergic/Immunologic: Negative for food allergies.  Neurological:  Negative for dizziness and light-headedness.  Psychiatric/Behavioral:  Negative for agitation.    Physical Exam   Blood pressure 136/88, pulse (!) 108, temperature 98.2 F (36.8 C), temperature source Oral, resp. rate 20, height 5' 4.5" (1.638 m), weight (!) 176.3 kg, SpO2 99 %.  Physical Exam Vitals and nursing note reviewed.  Constitutional:      General: She is not in acute distress.    Appearance: She is  well-developed.  HENT:     Head: Normocephalic and atraumatic.  Eyes:     General: No scleral icterus.    Conjunctiva/sclera: Conjunctivae normal.  Cardiovascular:     Rate and Rhythm: Normal rate.  Pulmonary:     Effort: Pulmonary effort is normal.  Chest:     Chest wall: No tenderness.  Breasts:    Right: Swelling, skin change and tenderness present. No mass.     Left: No swelling, mass, nipple discharge, skin change or tenderness.       Comments: Patient had large pendulous breasts Red is marked area from 5/6 Purple is new erythema noted on 5/7. Mostly extends medially  Abdominal:  Palpations: Abdomen is soft.     Tenderness: There is no abdominal tenderness. There is no guarding or rebound.  Genitourinary:    Vagina: Normal.  Musculoskeletal:        General: Normal range of motion.     Cervical back: Normal range of motion and neck supple.  Skin:    General: Skin is warm and dry.     Findings: No rash.  Neurological:     Mental Status: She is alert and oriented to person, place, and time.      MAU Course  Procedures  MDM: high  This patient presents to the ED for concern of   Chief Complaint  Patient presents with   Right Breast     These complains involves an extensive number of treatment options, and is a complaint that carries with it a high risk of complications and morbidity.  The differential diagnosis for  1. Breast pain INCLUDES mastitis, breast abscess, cellulitis, MRSA  Co morbidities that complicate the patient evaluation: Patient Active Problem List   Diagnosis Date Noted   Abdominal wall pain 03/01/2023   OSA on CPAP 02/24/2023   Sleeps in sitting position due to orthopnea 02/24/2023   Menorrhagia 01/28/2023   Healthcare maintenance 01/28/2023   Chest pain 01/28/2023   Back pain 01/28/2023   Sleep apnea 01/27/2023   Type 2 diabetes mellitus (HCC) 11/28/2022   Lupus (HCC) 11/28/2022   Class 3 obesity (HCC) 11/28/2022   Endometriosis  11/28/2022   Alkaline phosphatase elevation 11/28/2022   Anxiety 11/28/2022   Vitamin D deficiency 09/13/2012   Asthma 08/22/2012   External records from outside source obtained and reviewed including [facility] and [facility] records  Lab Tests: CMP, CBC, and Other lactic acid, Sed rate, CRP, blood cultures  I ordered, and personally interpreted labs.  The pertinent results include:   Results for orders placed or performed during the hospital encounter of 03/16/23 (from the past 24 hour(s))  Pregnancy, urine POC     Status: None   Collection Time: 03/16/23 10:27 AM  Result Value Ref Range   Preg Test, Ur NEGATIVE NEGATIVE  Comprehensive metabolic panel     Status: Abnormal   Collection Time: 03/16/23 11:24 AM  Result Value Ref Range   Sodium 137 135 - 145 mmol/L   Potassium 3.9 3.5 - 5.1 mmol/L   Chloride 105 98 - 111 mmol/L   CO2 22 22 - 32 mmol/L   Glucose, Bld 148 (H) 70 - 99 mg/dL   BUN <5 (L) 6 - 20 mg/dL   Creatinine, Ser 1.61 0.44 - 1.00 mg/dL   Calcium 8.8 (L) 8.9 - 10.3 mg/dL   Total Protein 6.9 6.5 - 8.1 g/dL   Albumin 3.1 (L) 3.5 - 5.0 g/dL   AST 13 (L) 15 - 41 U/L   ALT 14 0 - 44 U/L   Alkaline Phosphatase 74 38 - 126 U/L   Total Bilirubin 0.5 0.3 - 1.2 mg/dL   GFR, Estimated >09 >60 mL/min   Anion gap 10 5 - 15  C-reactive protein     Status: Abnormal   Collection Time: 03/16/23 11:24 AM  Result Value Ref Range   CRP 7.1 (H) <1.0 mg/dL  CBC     Status: Abnormal   Collection Time: 03/16/23 11:24 AM  Result Value Ref Range   WBC 9.0 4.0 - 10.5 K/uL   RBC 4.96 3.87 - 5.11 MIL/uL   Hemoglobin 12.3 12.0 - 15.0 g/dL   HCT 45.4 09.8 -  46.0 %   MCV 83.5 80.0 - 100.0 fL   MCH 24.8 (L) 26.0 - 34.0 pg   MCHC 29.7 (L) 30.0 - 36.0 g/dL   RDW 16.1 09.6 - 04.5 %   Platelets 306 150 - 400 K/uL   nRBC 0.0 0.0 - 0.2 %  Lactic acid, plasma     Status: None   Collection Time: 03/16/23 11:24 AM  Result Value Ref Range   Lactic Acid, Venous 1.4 0.5 - 1.9 mmol/L   MS  US BREAST LTD UNI RIGHT INC AXILLA  Result Date: 03/16/2023 CLINICAL DATA:  RIGHT breast cellulitis. EXAM: ULTRASOUND OF THE RIGHT BREAST COMPARISON:  None available. FINDINGS: Targeted ultrasound is performed, showing skin thickening and parenchymal edema in the MEDIAL aspect of the RIGHT breast. Within the skin in the 3 o'clock location, there is an irregular hypoechoic collection measuring 1.5 x 2.4 x 1.3 centimeters. There is hyperemia in the adjacent thickened skin. IMPRESSION: Intradermal irregular fluid collection in the 3 o'clock location of the RIGHT breast is consistent with small abscess, measuring 2.4 centimeters. RECOMMENDATION: [facility]. Ultrasound-guided aspiration can be considered at that time. I have discussed the findings and recommendations with the patient. If applicable, a reminder letter will be sent to the patient regarding the next appointment. BI-RADS CATEGORY  2: Benign. Electronically Signed   By: Norva Pavlov M.D.   On: 03/16/2023 12:21     Medicines ordered and prescription drug management:  Medications:  SMZ-TMP   Reevaluation of the patient after these medicines showed that the patient improved I have reviewed the patients home medicines and have made adjustments as needed   MAU Course:  At presentation low concern for sepsis-- she is stable appearing and non toxic-- no fever, +tachycardia (but this preexisted her complaint see OP visits in April with HR 105-110)  Patient has only had 24 hr of abx and had not had 24 hr of MRSA coverage abx (Septra). Only received one dose prior to presenting to the MAU.   1:21 PM Reviewed all lab work and imaging is consistent with small 2.4cm abscess. Feel this is still appropriate for outpatient management given lack of systemic symptoms.  Abscess is small but appropriate for outpatient drainage.   After the interventions noted above, I reevaluated the  patient and found that they have :improved  Dispostion: discharged  Assessment and Plan   1. Breast abscess of female   2. Abscess of right breast    - Has not truly failed outpatient therapy as she has only a 1 dose of augmentin and 1 dose of SMZ-TMP.  - Continue SMP-TMZ for 10 full days. Instructed to take dose ASAP and then another today.  - Follow up outpatient on 5/9 for possible US guided aspiration. Patient aware of appointment and plans to attend.  - Recommended daily temperature and if > 101.5 to come back to ER, or if redness and pain drastically increased.  - letter provided for work  [employer] 03/16/2023, 1:21 PM

## 2023-03-17 ENCOUNTER — Ambulatory Visit: Payer: Medicaid Other | Admitting: Licensed Clinical Social Worker

## 2023-03-17 ENCOUNTER — Encounter: Payer: Medicaid Other | Admitting: Student

## 2023-03-17 LAB — CULTURE, BLOOD (ROUTINE X 2)
Culture: NO GROWTH
Culture: NO GROWTH

## 2023-03-17 NOTE — Telephone Encounter (Signed)
Faxed proof of dx of diabetes.

## 2023-03-18 ENCOUNTER — Ambulatory Visit
Admission: RE | Admit: 2023-03-18 | Discharge: 2023-03-18 | Disposition: A | Discharge status: Home / Self Care | Payer: BC Managed Care – PPO | Source: Ambulatory Visit | Attending: Obstetrics and Gynecology | Admitting: Obstetrics and Gynecology

## 2023-03-18 ENCOUNTER — Other Ambulatory Visit: Payer: Self-pay

## 2023-03-18 ENCOUNTER — Telehealth: Payer: Self-pay | Admitting: Neurology

## 2023-03-18 DIAGNOSIS — N6341 Unspecified lump in right breast, subareolar: Secondary | ICD-10-CM

## 2023-03-18 DIAGNOSIS — N611 Abscess of the breast and nipple: Secondary | ICD-10-CM | POA: Diagnosis not present

## 2023-03-18 LAB — AEROBIC/ANAEROBIC CULTURE W GRAM STAIN (SURGICAL/DEEP WOUND)

## 2023-03-18 LAB — CULTURE, BLOOD (ROUTINE X 2): Special Requests: ADEQUATE

## 2023-03-18 MED ORDER — TIRZEPATIDE 2.5 MG/0.5ML ~~LOC~~ SOAJ
2.5000 mg | SUBCUTANEOUS | 2 refills | Status: DC
  Filled 2023-03-18: qty 2, 28d supply, fill #0

## 2023-03-18 NOTE — Telephone Encounter (Signed)
HST-urgent setup/insurance running out at end of may--[facility] requires no auth req   Patient is scheduled at [facility] for 03/23/23 at 7:30 AM.

## 2023-03-19 NOTE — Telephone Encounter (Signed)
Prior Auth for patients medication OZEMPIC approved by [facility] from 03/15/23 to 03/14/24.  CoverMyMeds Key: [id number]

## 2023-03-20 DIAGNOSIS — F4323 Adjustment disorder with mixed anxiety and depressed mood: Secondary | ICD-10-CM | POA: Diagnosis not present

## 2023-03-20 LAB — CULTURE, BLOOD (ROUTINE X 2)

## 2023-03-20 LAB — AEROBIC/ANAEROBIC CULTURE W GRAM STAIN (SURGICAL/DEEP WOUND)

## 2023-03-21 LAB — BLOOD CULTURE ID PANEL (REFLEXED) - BCID2

## 2023-03-21 LAB — CULTURE, BLOOD (ROUTINE X 2): Special Requests: ADEQUATE

## 2023-03-22 ENCOUNTER — Other Ambulatory Visit: Payer: Self-pay | Admitting: Obstetrics and Gynecology

## 2023-03-22 DIAGNOSIS — N611 Abscess of the breast and nipple: Secondary | ICD-10-CM | POA: Diagnosis not present

## 2023-03-22 LAB — AEROBIC/ANAEROBIC CULTURE W GRAM STAIN (SURGICAL/DEEP WOUND): Special Requests: NORMAL

## 2023-03-23 ENCOUNTER — Ambulatory Visit: Payer: BC Managed Care – PPO | Admitting: Neurology

## 2023-03-23 DIAGNOSIS — R519 Headache, unspecified: Secondary | ICD-10-CM

## 2023-03-23 DIAGNOSIS — E662 Morbid (severe) obesity with alveolar hypoventilation: Secondary | ICD-10-CM

## 2023-03-23 DIAGNOSIS — J45909 Unspecified asthma, uncomplicated: Secondary | ICD-10-CM

## 2023-03-23 DIAGNOSIS — R0601 Orthopnea: Secondary | ICD-10-CM

## 2023-03-23 DIAGNOSIS — E669 Obesity, unspecified: Secondary | ICD-10-CM

## 2023-03-23 DIAGNOSIS — G4733 Obstructive sleep apnea (adult) (pediatric): Secondary | ICD-10-CM

## 2023-03-23 LAB — AEROBIC/ANAEROBIC CULTURE W GRAM STAIN (SURGICAL/DEEP WOUND): Culture: NO GROWTH

## 2023-03-23 LAB — CULTURE, BLOOD (ROUTINE X 2)

## 2023-03-24 NOTE — Progress Notes (Signed)
[facility]  Jade Lopez 27 year old female 1996-07-26   HOME SLEEP TEST REPORT ( by Watch PAT)   STUDY DATE:  03-24-2023   ORDERING CLINICIAN: Melvyn Novas, MD  REFERRING CLINICIAN:  Dr Kendrick Fries, MD    CLINICAL INFORMATION/HISTORY:This young female patient presented on 02-24-2023 as a patient with a 12 year history of OSA and CPAP use, -super obesity, recent breast abscess and  abnormal uterine bleeding deemed to be at high risk for obesity hypoventilation. Migraine, orthopnoea and asthma were endorsed.     Epworth sleepiness score: 10/24. On current 27 year old CPAP, FSS at 38/ 63 points.    BMI: 67.4 kg/m   Neck Circumference: 18"   FINDINGS:   Sleep Summary:   Total Recording Time (hours, min):     7 hours 42 minutes   Total Sleep Time (hours, min):    6 hours 34 minutes             Percent REM (%):     22.7%                                   Respiratory Indices:   Calculated pAHI (per hour):   33.3/h                          REM pAHI:   57.4/h                                              NREM pAHI:     14.7/h                         Positional AHI:   The patient only slept in supine and due to orthopnea slightly elevated.    There was minimal snoring recorded by chest wall vibration sensor.                                            Oxygen Saturation Statistics:          O2 Saturation Range (%):    Between a nadir of 86 and a maximum saturation of 98% with a mean saturation of 93%                                   O2 Saturation (minutes) <89%: 0.3 minutes         Pulse Rate Statistics:   Pulse Mean (bpm):   107  bpm         Pulse Range:    Between 75 and 127 beats per minute             IMPRESSION:  This HST confirms the presence of severe and apparently obstructive sleep apnea with a very strong REM sleep dependence.  REM sleep apnea exceeded normal REM sleep apnea 4 times.  There is tachycardia at baseline.  The  device does not tell us about cardiac rhythm data.    RECOMMENDATION: REM sleep dependent apnea  with or without hypoventilation will need to be treated with a positive airway pressure device either CPAP or BiPAP. I like for the patient to continue with CPAP therapy.  Her current device is set at 15 cm of water I will like to start an auto titration device between 7 and  20 cm of water pressure , 3 cm EPR, humidifier and interface of her choosing.    INTERPRETING PHYSICIAN:   Melvyn Novas, MD   Medical Director of [facility].

## 2023-03-26 ENCOUNTER — Encounter: Payer: Self-pay | Admitting: Family Medicine

## 2023-03-27 DIAGNOSIS — F4323 Adjustment disorder with mixed anxiety and depressed mood: Secondary | ICD-10-CM | POA: Diagnosis not present

## 2023-03-29 ENCOUNTER — Ambulatory Visit: Payer: BC Managed Care – PPO | Admitting: *Deleted

## 2023-03-29 ENCOUNTER — Telehealth: Payer: Self-pay

## 2023-03-29 DIAGNOSIS — Z3042 Encounter for surveillance of injectable contraceptive: Secondary | ICD-10-CM | POA: Diagnosis not present

## 2023-03-29 MED ORDER — MEDROXYPROGESTERONE ACETATE 104 MG/0.65ML ~~LOC~~ SUSY
104.0000 mg | PREFILLED_SYRINGE | Freq: Once | SUBCUTANEOUS | Status: AC
Start: 2023-03-29 — End: 2023-03-29
  Administered 2023-03-29: 104 mg via SUBCUTANEOUS

## 2023-03-29 NOTE — Telephone Encounter (Signed)
Pa for pt [facility] ) came through on cover my meds was submitted with last office notes .Marland Kitchen Awaiting approval or denial

## 2023-03-30 NOTE — Procedures (Signed)
           [facility]  Jade Lopez 26 year old female 06/12/1996   HOME SLEEP TEST REPORT ( by Watch PAT)   STUDY DATE:  03-24-2023   ORDERING CLINICIAN: Adeliz Tonkinson, MD  REFERRING CLINICIAN:  Dr J Anne Williams, MD    CLINICAL INFORMATION/HISTORY:This young female patient presented on 02-24-2023 as a patient with a 12 year history of OSA and CPAP use, -super obesity, recent breast abscess and  abnormal uterine bleeding deemed to be at high risk for obesity hypoventilation. Migraine, orthopnoea and asthma were endorsed.     Epworth sleepiness score: 10/24. On current 27 year old CPAP, FSS at 38/ 63 points.    BMI: 67.4 kg/m   Neck Circumference: 18"   FINDINGS:   Sleep Summary:   Total Recording Time (hours, min):     7 hours 42 minutes   Total Sleep Time (hours, min):    6 hours 34 minutes             Percent REM (%):     22.7%                                   Respiratory Indices:   Calculated pAHI (per hour):   33.3/h                          REM pAHI:   57.4/h                                              NREM pAHI:     14.7/h                         Positional AHI:   The patient only slept in supine and due to orthopnea slightly elevated.    There was minimal snoring recorded by chest wall vibration sensor.                                            Oxygen Saturation Statistics:          O2 Saturation Range (%):    Between a nadir of 86 and a maximum saturation of 98% with a mean saturation of 93%                                   O2 Saturation (minutes) <89%: 0.3 minutes         Pulse Rate Statistics:   Pulse Mean (bpm):   107  bpm         Pulse Range:    Between 75 and 127 beats per minute             IMPRESSION:  This HST confirms the presence of severe and apparently obstructive sleep apnea with a very strong REM sleep dependence.  REM sleep apnea exceeded normal REM sleep apnea 4 times.  There is tachycardia at baseline.  The  device does not tell us about cardiac rhythm data.    RECOMMENDATION: REM sleep dependent apnea   with or without hypoventilation will need to be treated with a positive airway pressure device either CPAP or BiPAP. I like for the patient to continue with CPAP therapy.  Her current device is set at 15 cm of water I will like to start an auto titration device between 7 and  20 cm of water pressure , 3 cm EPR, humidifier and interface of her choosing.    INTERPRETING PHYSICIAN:   Tiea Manninen, MD   Medical Director of [facility].                    

## 2023-03-30 NOTE — Addendum Note (Signed)
Addended by: Melvyn Novas on: 03/30/2023 08:07 PM   Modules accepted: Orders

## 2023-03-31 ENCOUNTER — Other Ambulatory Visit: Payer: Self-pay

## 2023-03-31 ENCOUNTER — Ambulatory Visit (INDEPENDENT_AMBULATORY_CARE_PROVIDER_SITE_OTHER): Payer: BC Managed Care – PPO | Admitting: Student

## 2023-03-31 ENCOUNTER — Ambulatory Visit: Payer: Medicaid Other | Admitting: Licensed Clinical Social Worker

## 2023-03-31 ENCOUNTER — Encounter: Payer: Self-pay | Admitting: Neurology

## 2023-03-31 ENCOUNTER — Encounter: Payer: Self-pay | Admitting: Student

## 2023-03-31 VITALS — BP 126/94 | HR 107 | Temp 98.3°F | Ht 64.5 in | Wt >= 6400 oz

## 2023-03-31 DIAGNOSIS — M329 Systemic lupus erythematosus, unspecified: Secondary | ICD-10-CM

## 2023-03-31 DIAGNOSIS — N611 Abscess of the breast and nipple: Secondary | ICD-10-CM

## 2023-03-31 DIAGNOSIS — E119 Type 2 diabetes mellitus without complications: Secondary | ICD-10-CM | POA: Diagnosis not present

## 2023-03-31 DIAGNOSIS — R109 Unspecified abdominal pain: Secondary | ICD-10-CM

## 2023-04-01 ENCOUNTER — Other Ambulatory Visit: Payer: BC Managed Care – PPO

## 2023-04-01 ENCOUNTER — Other Ambulatory Visit (HOSPITAL_COMMUNITY): Payer: Self-pay

## 2023-04-01 MED ORDER — CEPHALEXIN 500 MG PO CAPS: 500.0000 mg | ORAL_CAPSULE | Freq: Four times a day (QID) | ORAL | 0 refills | Status: DC

## 2023-04-01 NOTE — Telephone Encounter (Signed)
Prior Auth for patients medication LYRICA denied by [facility] via CoverMyMeds.   SUBMITTED ELECTRONIC APPEAL REQUEST - ATTACHED DOCUMENTATION OF PREGABALIN ALLERGY  CoverMyMeds Key: [id number] [place] Case ID: [id number]

## 2023-04-03 DIAGNOSIS — N611 Abscess of the breast and nipple: Secondary | ICD-10-CM | POA: Insufficient documentation

## 2023-04-03 NOTE — Assessment & Plan Note (Signed)
Patient recently started on ozempic. Will discuss referral to bariatric surgery at next clinic visit. She has also been referred to medical weight management with [facility].

## 2023-04-03 NOTE — Assessment & Plan Note (Signed)
Patient only recently was approved for ozempic as mounjaro was on Acupuncturist. Her SGLTi was held because patient was concerned this could lead to HA and because she has had endometritis in the past. As a result of this patient does not want her A1c checked today.  When she follows up in 2 months she will need this checked.  Patient has had metformin intolerance in the past. She may require insulin in the future.

## 2023-04-03 NOTE — Assessment & Plan Note (Addendum)
Patient continues to have abdominal wall pain. As previously mentioned this pain is where the patient has a palpable nodule. She has undergone a recent CT A/P which showed a small fat containing umbilical hernia. This scan was also notable for several prominent mesenteric lymph nodes. The exact etiology of her pain remains unclear. She has had diagnostic laparoscopy in the past. It is possible that she has scar tissue as a result of this leading to neuropathic pain. It could also be a dermatofibroma, expected characteristics might not be present in this patient due to her body habitus.   Discussed with patient that if her pain remains unresolved with medical management, we will explore injection of local anesthetic to see if this helps with her symptoms.

## 2023-04-03 NOTE — Assessment & Plan Note (Signed)
Patient presented to [facility] 5/6. She had erythema of her R breast. She underwent a R breast US 5/7 which showed "Intradermal irregular fluid collection in the 3 o'clock location of the RIGHT breast is consistent with small abscess, measuring 2.4 centimeters." She underwent aspiration of this fluid collection 5/9 with the following results from cytology and culture. "Normal gram Stain ABUNDANT WBC PRESENT, PREDOMINANTLY PMN NO ORGANISMS SEEN culture No growth aerobically or anaerobically." She was started on a 10d course of bactrim 5/6. She was seen by gen surg 5/13, at this appointment because her symptoms and exam had improved it was decided that surgical intervention was not warranted unless patient's symptoms worsened. Since that time patient states that she has noticed that she has had tenderness of the breast and she has noticed an increase in the size of the nodule that she first noticed in her breast. She has no systemic symptoms of infection.   Plan:  Discussed patient that she should reach back out to [facility] and state that she needs to be re-evaluated.  Patient was started on a 10d course of keflex.

## 2023-04-03 NOTE — Progress Notes (Signed)
   CC: F/u of left abdominal pain and R breast abscess   HPI:  [name] is a 27 y.o. F with PMH per below. She presents for follow up of her L sided abdominal pain as well as for a R breast abscess. Please see problem based charting under encounters tab for further details.    Past Medical History:  Diagnosis Date   Asthma    Autoimmune disease (HCC)    Blood clots in brain 2020   Class 3 obesity (HCC)    Endometriosis    Endometritis    Fibromyalgia    Sleep apnea    Sleep related headaches 02/24/2023   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   T2DM (type 2 diabetes mellitus) (HCC)    TOA (tubo-ovarian abscess)    Review of Systems:  Please see problem based charting under encounters tab for further details.    Physical Exam:  Vitals:   03/31/23 0909  BP: (!) 126/94  Pulse: (!) 107  Temp: 98.3 F (36.8 C)  TempSrc: Oral  SpO2: 100%  Weight: (!) 402 lb 11.2 oz (182.7 kg)  Height: 5' 4.5" (1.638 m)    Constitutional: Well-developed, well-nourished, and in no distress.  Cardiovascular: Normal rate, regular rhythm, intact distal pulses. No gallop and no friction rub.  No murmur heard. No lower extremity edema  Pulmonary: Non labored breathing on room air, no wheezing or rales  Abdominal: Soft. Normal bowel sounds. Non distended. TTP in L side of abdomen just lateral to umbilicus, palpable nodule in this region.  Musculoskeletal: Normal range of motion.      Neurological: Alert and oriented to person, place, and time. Non focal  Skin: Skin is warm and dry. L side of her back, about 12 cm from midline around level of T8, nodule, TTP, no erythema or warmth of overlying skin, no fluctuance. R breast with TTP around the areola. Palpable nodule. Skin warm to touch. Mild erythema.    Assessment & Plan:   See Encounters Tab for problem based charting.  Patient seen with Dr. Mayford Knife

## 2023-04-03 NOTE — Assessment & Plan Note (Signed)
Patient continues to have ESR/CRP elevations that preceded her R breast abscess. She has been referred to rheumatology and that appointment is 07/2023.

## 2023-04-06 ENCOUNTER — Other Ambulatory Visit (HOSPITAL_COMMUNITY): Payer: Self-pay

## 2023-04-06 ENCOUNTER — Encounter: Payer: Self-pay | Admitting: Family Medicine

## 2023-04-06 ENCOUNTER — Ambulatory Visit (INDEPENDENT_AMBULATORY_CARE_PROVIDER_SITE_OTHER): Payer: BC Managed Care – PPO | Admitting: Family Medicine

## 2023-04-06 ENCOUNTER — Other Ambulatory Visit: Payer: Self-pay

## 2023-04-06 ENCOUNTER — Ambulatory Visit: Payer: Medicaid Other | Admitting: Licensed Clinical Social Worker

## 2023-04-06 VITALS — BP 127/94 | HR 106 | Ht 64.5 in | Wt 391.0 lb

## 2023-04-06 DIAGNOSIS — N63 Unspecified lump in unspecified breast: Secondary | ICD-10-CM

## 2023-04-06 DIAGNOSIS — N611 Abscess of the breast and nipple: Secondary | ICD-10-CM

## 2023-04-06 MED ORDER — LEVOFLOXACIN 500 MG PO TABS
500.0000 mg | ORAL_TABLET | Freq: Every day | ORAL | 0 refills | Status: DC
Start: 2023-04-06 — End: 2023-04-17

## 2023-04-06 NOTE — Telephone Encounter (Signed)
UPDATE :    N/A on May 23   System is not able to process this request because the previous Prior Authorization Appeal Request was Denied, please contact the independent review entity for further information.   Drug Lyrica 50MG  capsules  Form [employer] [place] [employer] Electronic Request Form  PA Case ID #: [id number] Rx #: [id number]

## 2023-04-06 NOTE — Assessment & Plan Note (Addendum)
See excellent summary from Dr. Montez Morita a few days ago for her clinical course.  No improvement on Keflex, due to multiple allergies and prior trials (allergic to clinda, already trialied on bactrim and doxycycline), will do 5 day course of levofloxacin. [facility] clinic here in [place] for evaluation of excision of this area. Will also refer back to breast center for evaluation and breast ultrasound of small nodules, suspect these are reactive lymph nodes.  Discussed ED precautions.

## 2023-04-06 NOTE — Progress Notes (Signed)
GYNECOLOGY OFFICE VISIT NOTE  History:   Jade Lopez is a 27 y.o. G0P0000 here today for breast concern.  Patient has been seen multiple times over the past month for a recurrent R breast abscess Dr. Montez Morita wrote an excellent summary of her course on 03/31/23: " Patient presented to [facility] GYN 5/6. She had erythema of her R breast. She underwent a R breast US 5/7 which showed "Intradermal irregular fluid collection in the 3 o'clock location of the RIGHT breast is consistent with small abscess, measuring 2.4 centimeters." She underwent aspiration of this fluid collection 5/9 with the following results from cytology and culture. "Normal gram Stain ABUNDANT WBC PRESENT, PREDOMINANTLY PMN NO ORGANISMS SEEN culture No growth aerobically or anaerobically." She was started on a 10d course of bactrim 5/6. She was seen by gen surg 5/13, at this appointment because her symptoms and exam had improved it was decided that surgical intervention was not warranted unless patient's symptoms worsened. Since that time patient states that she has noticed that she has had tenderness of the breast and she has noticed an increase in the size of the nodule that she first noticed in her breast. She has no systemic symptoms of infection.    Plan:  Discussed patient that she should reach back out to [facility] and state that she needs to be re-evaluated.  Patient was started on a 10d course of keflex.  "  Today reports R breast has been worsening despite antibiotics Reports history of abscesses that have occurred multiple times over the past couple of years She would like to have definitive management Has not had any fevers Has also noticed some bumps in R breast superior to her nipple, like small pebbles Went to [facility] but feels it was a waste of her time and they told her they do not take Medicaid   Health Maintenance Due  Topic Date Due   COVID-19 Vaccine (1) Never done   OPHTHALMOLOGY EXAM  Never  done   HPV VACCINES (1 - 2-dose series) Never done   HIV Screening  Never done   Hepatitis C Screening  Never done   PAP-Cervical Cytology Screening  Never done   PAP SMEAR-Modifier  Never done    Past Medical History:  Diagnosis Date   Asthma    Autoimmune disease (HCC)    Blood clots in brain 2020   Class 3 obesity (HCC)    Endometriosis    Endometritis    Fibromyalgia    Sleep apnea    Sleep related headaches 02/24/2023   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   Health Maintenance:   Last pap: No results found for: "DIAGPAP", "HPV", "HPVHIGH" *needs*  Last mammogram:  N/a    Review of Systems:  Pertinent items noted in HPI and remainder of comprehensive ROS otherwise negative.  Physical Exam:  BP (!) 127/94   Pulse (!) 106   Ht 5' 4.5" (1.638 m)   Wt (!) 391 lb (177.4 kg)  LMP  (LMP Unknown) Comment: some time in february, lasted about 2 weeks  BMI 66.08 kg/m  CONSTITUTIONAL: Well-developed, well-nourished female in no acute distress.  HEENT:  Normocephalic, atraumatic. External right and left ear normal. No scleral icterus.  NECK: Normal range of motion, supple, no masses noted on observation BREAST: fluctuant area underneath and medial to R nipple, warm to the touch with mild overlying erythema measuring about 4x2 cm MUSCULOSKELETAL: Normal range of motion. No edema noted. NEUROLOGIC: Alert and oriented to person, place, and time. Normal muscle tone coordination.  PSYCHIATRIC:  Normal mood and affect. Normal behavior. Normal judgment and thought content. RESPIRATORY: Effort normal, no problems with respiration noted   Labs and Imaging No results found for this or any previous visit (from the past 168 hour(s)). US BREAST ASPIRATION RIGHT  Addendum Date: 03/23/2023   ADDENDUM REPORT: 03/23/2023 15:11 ADDENDUM: Microbiology labs revealed specimen Description ABSCESS RIGHT BREAST. Special Requests Normal gram Stain ABUNDANT WBC PRESENT, PREDOMINANTLY PMN NO ORGANISMS SEEN culture No growth aerobically or anaerobically. Performed at [facility], [address], [place], [place] [address]. This was found to be concordant by Dr. Sande Brothers. Microbiology lab results were discussed with the patient by telephone. The patient reported doing well after the aspiration with no complications at the site. Post aspiration instructions and care were reviewed and questions were answered. The patient was encouraged to call [facility] for any additional concerns. My direct phone number was provided. RECOMMENDATION: Surgical consultation has been arranged for patient to see [facility] on 03/23/2023. Pathology results reported by Lynett Grimes, RN on 03/23/2023. Electronically Signed   By: Sande Brothers M.D.   On: 03/23/2023 15:11   Result Date: 03/23/2023 CLINICAL DATA:  27 year old patient with a subareolar right breast collection for drainage. She was started on a course of Bactrim from the emergency department 2 days ago. EXAM: ULTRASOUND GUIDED RIGHT BREAST ABSCESS ASPIRATION COMPARISON:  Previous exam(s). PROCEDURE: The patient and I discussed the procedure of ultrasound-guided aspiration including benefits and alternatives. We discussed the high likelihood of a successful procedure. We discussed the risks of the procedure including infection, bleeding, tissue injury, and inadequate sampling. Informed written consent was given. The usual time out  protocol was performed immediately prior to the procedure. Using sterile technique, 1% lidocaine, under direct ultrasound visualization, needle aspiration of a fluid collection in the subareolar right breast was performed. The collection did not decompressed completely, with residual phlegmonous/complex fluid remaining. A larger gauge needle could not be used due to the size and location of the collection within the skin. Aspirated fluid was sent to the lab for microbiology. IMPRESSION: Ultrasound-guided aspiration of a superficial right subareolar collection. No apparent complications. RECOMMENDATIONS: Recommendation is surgical consultation for evaluation and possible incision and drainage of the patient's subareolar fluid collection. This was incompletely drained percutaneously. Electronically Signed: By: Sande Brothers M.D. On: 03/18/2023 13:29  Home sleep test  Result Date: 03/23/2023 Melvyn Novas, MD     03/30/2023  8:04 PM   [facility] Melven Sartorius 27 year old female 05-20-1996  HOME SLEEP TEST REPORT ( by Watch PAT)  STUDY DATE:  03-24-2023  ORDERING CLINICIAN: Melvyn Novas, MD REFERRING CLINICIAN:  Dr Kendrick Fries, MD  CLINICAL INFORMATION/HISTORY:This young female patient presented on 02-24-2023 as a patient with a 12 year history of OSA and CPAP use, -super obesity, recent breast abscess and  abnormal uterine bleeding deemed to be at high  risk for obesity hypoventilation. Migraine, orthopnoea and asthma were endorsed.  Epworth sleepiness score: 10/24. On current 27 year old CPAP, FSS at 38/ 63 points.  BMI: 67.4 kg/m  Neck Circumference: 18"  FINDINGS:  Sleep Summary:  Total Recording Time (hours, min):     7 hours 42 minutes  Total Sleep Time (hours, min):    6 hours 34 minutes           Percent REM (%):     22.7%                                 Respiratory Indices:  Calculated pAHI (per hour):   33.3/h                        REM pAHI:   57.4/h                                             NREM pAHI:     14.7/h                       Positional AHI:   The patient only slept in supine and due to orthopnea slightly elevated.    There was minimal snoring recorded by chest wall vibration sensor.                                          Oxygen Saturation Statistics:        O2 Saturation Range (%):    Between a nadir of 86 and a maximum saturation of 98% with a mean saturation of 93%                                 O2 Saturation (minutes) <89%: 0.3 minutes       Pulse Rate Statistics:  Pulse Mean (bpm):   107  bpm       Pulse Range:    Between 75 and 127 beats per minute           IMPRESSION:  This HST confirms the presence of severe and apparently obstructive sleep apnea with a very strong REM sleep dependence.  REM sleep apnea exceeded normal REM sleep apnea 4 times.  There is tachycardia at baseline.  The device does not tell us about cardiac rhythm data.  RECOMMENDATION: REM sleep dependent apnea with or without hypoventilation will need to be treated with a positive airway pressure device either CPAP or BiPAP. I like for the patient to continue with CPAP therapy.  Her current device is set at 15 cm of water I will like to start an auto titration device between 7 and  20 cm of water pressure , 3 cm EPR, humidifier and interface of her choosing.  INTERPRETING PHYSICIAN:  Melvyn Novas, MD Medical Director of [facility] at [facility].   MS US BREAST LTD UNI RIGHT INC AXILLA  Result Date: 03/16/2023 CLINICAL DATA:  RIGHT breast cellulitis. EXAM: ULTRASOUND OF THE RIGHT BREAST COMPARISON:  None available. FINDINGS: Targeted ultrasound is performed, showing skin thickening and parenchymal edema in the MEDIAL aspect of the RIGHT  breast. Within the skin in the 3 o'clock location, there is an irregular hypoechoic collection measuring 1.5 x 2.4 x 1.3 centimeters. There is hyperemia in the adjacent thickened skin. IMPRESSION: Intradermal irregular fluid collection in the 3 o'clock location of the RIGHT  breast is consistent with small abscess, measuring 2.4 centimeters. RECOMMENDATION: Patient is scheduled for further evaluation at the [facility] Center on 03/18/2023. Ultrasound-guided aspiration can be considered at that time. I have discussed the findings and recommendations with the patient. If applicable, a reminder letter will be sent to the patient regarding the next appointment. BI-RADS CATEGORY  2: Benign. Electronically Signed   By: Norva Pavlov M.D.   On: 03/16/2023 12:21      Assessment and Plan:   Problem List Items Addressed This Visit       Other   Abscess of right breast - Primary    See excellent summary from Dr. Montez Morita a few days ago for her clinical course.  No improvement on Keflex, due to multiple allergies and prior trials (allergic to clinda, already trialied on bactrim and doxycycline), will do 5 day course of levofloxacin. [facility] clinic here in [place] for evaluation of excision of this area. Will also refer back to breast center for evaluation and breast ultrasound of small nodules, suspect these are reactive lymph nodes.  Discussed ED precautions.      Relevant Medications   levofloxacin (LEVAQUIN) 500 MG tablet   Other Relevant Orders   Ambulatory referral to General Surgery   Other Visit Diagnoses     Breast nodule       Relevant Orders   US BREAST COMPLETE UNI RIGHT INC AXILLA       Routine preventative health maintenance measures emphasized. Please refer to After Visit Summary for other counseling recommendations.   Return if symptoms worsen or fail to improve.    Total face-to-face time with patient: 20 minutes.  Over 50% of encounter was spent on counseling and coordination of care.   Venora Maples, MD/MPH Attending Family Medicine Physician, [facility] for [facility], [facility]

## 2023-04-06 NOTE — Telephone Encounter (Signed)
Reached out to insurance to f/u on appeal. Information was never rec'd due to insurance not accepting appeal electronically. Will fax letter to insurance [phone]) with documentation of trial and failure of pregabalin dates, and side effects.   Case ID# [id number]

## 2023-04-07 ENCOUNTER — Telehealth: Payer: Self-pay

## 2023-04-07 ENCOUNTER — Other Ambulatory Visit (HOSPITAL_COMMUNITY): Payer: Self-pay

## 2023-04-07 NOTE — Telephone Encounter (Signed)
Faxed appeal letter to insurance

## 2023-04-07 NOTE — Telephone Encounter (Signed)
NA

## 2023-04-09 ENCOUNTER — Encounter: Payer: Self-pay | Admitting: Student

## 2023-04-09 ENCOUNTER — Other Ambulatory Visit: Payer: Self-pay

## 2023-04-09 ENCOUNTER — Ambulatory Visit (INDEPENDENT_AMBULATORY_CARE_PROVIDER_SITE_OTHER): Payer: BC Managed Care – PPO | Admitting: Student

## 2023-04-09 VITALS — BP 134/96 | HR 102 | Temp 98.1°F | Ht 64.0 in | Wt 398.7 lb

## 2023-04-09 DIAGNOSIS — R102 Pelvic and perineal pain: Secondary | ICD-10-CM | POA: Diagnosis not present

## 2023-04-09 DIAGNOSIS — E559 Vitamin D deficiency, unspecified: Secondary | ICD-10-CM | POA: Diagnosis not present

## 2023-04-09 DIAGNOSIS — N611 Abscess of the breast and nipple: Secondary | ICD-10-CM

## 2023-04-09 DIAGNOSIS — M792 Neuralgia and neuritis, unspecified: Secondary | ICD-10-CM | POA: Diagnosis not present

## 2023-04-09 DIAGNOSIS — M797 Fibromyalgia: Secondary | ICD-10-CM

## 2023-04-09 DIAGNOSIS — R109 Unspecified abdominal pain: Secondary | ICD-10-CM

## 2023-04-09 DIAGNOSIS — N939 Abnormal uterine and vaginal bleeding, unspecified: Secondary | ICD-10-CM | POA: Diagnosis not present

## 2023-04-09 NOTE — Patient Instructions (Addendum)
Please schedule an appointment in 3 months so we can check your A1c.   Regarding your R breast pain and abscess, please call the surgery office. [facility]   Please also mention to them your L sided abdominal wall pain related to the nodule. It is possible that they can perform an injection of lidocaine in this area.

## 2023-04-10 DIAGNOSIS — F4323 Adjustment disorder with mixed anxiety and depressed mood: Secondary | ICD-10-CM | POA: Diagnosis not present

## 2023-04-10 LAB — FOLATE

## 2023-04-11 DIAGNOSIS — M797 Fibromyalgia: Secondary | ICD-10-CM | POA: Insufficient documentation

## 2023-04-11 LAB — VITAMIN B12: Vitamin B-12: 483 pg/mL (ref 232–1245)

## 2023-04-11 LAB — VITAMIN D 25 HYDROXY (VIT D DEFICIENCY, FRACTURES): Vit D, 25-Hydroxy: 17.8 ng/mL — ABNORMAL LOW (ref 30.0–100.0)

## 2023-04-11 LAB — VITAMIN B1: Thiamine: 98.8 nmol/L (ref 66.5–200.0)

## 2023-04-11 NOTE — Assessment & Plan Note (Signed)
The etiology of her symptoms remains unclear. It is possible that she has anterior nerve entrapment syndrome given the focal nature of her pain and her preceding laparoscopic procedures.   Discussed with patient that we unfortunately cannot safely perform an injection into the region where she is having discomfort after further consideration.   Patient will mention this to her general surgery team to see if they are willing to inject local anesthetic into this area.

## 2023-04-11 NOTE — Progress Notes (Signed)
   CC: F/u for L sided abdominal wall pain   HPI:  [name] is a 27 y.o. F with PMH per below. She presents to clinic for follow up of her persistent L sided abdominal wall pain.   Patient was last seen for this about a week ago. At that clinic visit we discussed if her symptoms did not improve with her pregablin then we could consider injection of local anesthetic. Patient continues to have focal abdominal pain about 5cm to the L of her umbilicus. In this area she has a nodule. She denies any changes of her overlying skin. She denies feeling feverish. She denies N/V. The pain has been so severe that she has had to work from home.   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  Cardiovascular: Normal rate, regular rhythm, intact distal pulses. No gallop and no friction rub.  No murmur heard. No lower extremity edema  Pulmonary: Non labored breathing on room air, no wheezing or rales  Abdominal: Soft. Obese. Normal bowel sounds. Non distended. TTP ~5cm to the L of the umbilicus, there is a palpable nodule. No erythema of overlying skin, no fluctuance.   Musculoskeletal: Normal range of motion.        General: No tenderness or edema.  Neurological: Alert and oriented to person, place, and time. Non focal    Assessment & Plan:   See Encounters Tab for problem based  charting.  Patient discussed with Dr. Oswaldo Done

## 2023-04-11 NOTE — Assessment & Plan Note (Addendum)
Patient has a history of fibromyalgia and multiple neuropathic pain complaints. We discussed checking her vitamin b1, b12, and folate levels because of this.   These levels were all within normal limits.   Patient has an intolerance to the generic pregabalin, she also cannot take the preferred alternative Savella. Pt becomes tachycardic, a known possible side effect, with these medications. Her fibromyalgia symptoms have previously been well controlled on lyrica, and patient would need to continue with this medication.

## 2023-04-11 NOTE — Assessment & Plan Note (Signed)
Patient recently followed up with her gynecologist regarding this. She was prescribed a course of levofloxacin which she has not been able to pick up yet. She plans to reach out to [facility] in order to have her worsening abscess addressed.

## 2023-04-11 NOTE — Assessment & Plan Note (Signed)
Patient's vitamin D levels remain low. She will continue on supplementation for this.

## 2023-04-12 ENCOUNTER — Telehealth: Payer: Self-pay

## 2023-04-12 ENCOUNTER — Encounter: Payer: Self-pay | Admitting: *Deleted

## 2023-04-12 NOTE — Progress Notes (Signed)
Internal Medicine Clinic Attending  Case discussed with Dr. Carter  At the time of the visit.  We reviewed the resident's history and exam and pertinent patient test results.  I agree with the assessment, diagnosis, and plan of care documented in the resident's note.  

## 2023-04-12 NOTE — Progress Notes (Signed)
Internal Medicine Clinic Attending  I saw and evaluated the patient.  I personally confirmed the key portions of the history and exam documented by Dr. Montez Morita and I reviewed pertinent patient test results.  The assessment, diagnosis, and plan were formulated together and I agree with the documentation in the resident's note. Given a similar lesion on her L upper back, query dx of a panniculitis.  This would require a biopsy.

## 2023-04-12 NOTE — Telephone Encounter (Signed)
Prior Authorization for patient (Lyrica) came through on cover my meds was submitted with last office notes awaiting approval or denial

## 2023-04-13 NOTE — Telephone Encounter (Signed)
Per cover my meds: Your request has been n/a Updated Case Required.Closed by Provider

## 2023-04-14 NOTE — Telephone Encounter (Signed)
Hey Dr.Carter, the message below is the only information I received. Im no sure if that means denied.

## 2023-04-14 NOTE — Telephone Encounter (Signed)
Sorry guys, my appeal letter never went thru in fax. Im sending it again today! Hopefully we can get her approved!

## 2023-04-15 DIAGNOSIS — G8929 Other chronic pain: Secondary | ICD-10-CM | POA: Diagnosis not present

## 2023-04-15 DIAGNOSIS — M6289 Other specified disorders of muscle: Secondary | ICD-10-CM | POA: Diagnosis not present

## 2023-04-15 DIAGNOSIS — N858 Other specified noninflammatory disorders of uterus: Secondary | ICD-10-CM | POA: Diagnosis not present

## 2023-04-15 DIAGNOSIS — R102 Pelvic and perineal pain: Secondary | ICD-10-CM | POA: Diagnosis not present

## 2023-04-16 DIAGNOSIS — G4733 Obstructive sleep apnea (adult) (pediatric): Secondary | ICD-10-CM | POA: Diagnosis not present

## 2023-04-17 ENCOUNTER — Emergency Department (HOSPITAL_COMMUNITY)
Admission: EM | Admit: 2023-04-17 | Discharge: 2023-04-17 | Disposition: A | Discharge status: Home / Self Care | Payer: BC Managed Care – PPO | Attending: Emergency Medicine | Admitting: Emergency Medicine

## 2023-04-17 ENCOUNTER — Other Ambulatory Visit: Payer: Self-pay

## 2023-04-17 ENCOUNTER — Encounter (HOSPITAL_COMMUNITY): Payer: Self-pay | Admitting: Emergency Medicine

## 2023-04-17 ENCOUNTER — Emergency Department (HOSPITAL_COMMUNITY): Payer: BC Managed Care – PPO

## 2023-04-17 DIAGNOSIS — N644 Mastodynia: Secondary | ICD-10-CM | POA: Diagnosis not present

## 2023-04-17 DIAGNOSIS — N611 Abscess of the breast and nipple: Secondary | ICD-10-CM | POA: Insufficient documentation

## 2023-04-17 MED ORDER — OXYCODONE HCL 5 MG PO TABS: 5.0000 mg | ORAL_TABLET | ORAL | 0 refills | Status: DC | PRN

## 2023-04-17 MED ORDER — SULFAMETHOXAZOLE-TRIMETHOPRIM 800-160 MG PO TABS: 1.0000 | ORAL_TABLET | Freq: Two times a day (BID) | ORAL | 0 refills | Status: AC

## 2023-04-17 NOTE — ED Provider Notes (Signed)
[place] EMERGENCY DEPARTMENT AT [facility] Provider Note   CSN: [id number] Arrival date & time: 04/17/23  1045     History  Chief Complaint  Patient presents with   Breast Pain    Jade Lopez is a 27 y.o. female.  27 year old female with a history of breast abscess who presents to the emergency department with breast pain.  Patient reports that for over a month has been having pain in her breast near her right nipple.  Says that she has been on multiple rounds of antibiotics which have included Keflex, Bactrim, doxycycline, and levofloxacin most recently that was 5-day course started on 04/06/2023.  She also had an ultrasound on 03/15/2023 that showed an irregular hypoechoic fluid collection that was 1.5 x 2.4 x 1.3 cm and had a needle aspiration of the fluid that was incompletely drained.  That culture resulted negative.  Followed up with general surgery on 03/22/2023 but they do not feel that there is any fluid to drain at that point in time.  Says that she has had persistent redness and pain now shooting up her breast.  Has had off-and-on fevers but none recently.  No weight loss.  No history of breast cancer       Home Medications Prior to Admission medications   Medication Sig Start Date End Date Taking? Authorizing Provider  oxyCODONE (ROXICODONE) 5 MG immediate release tablet Take 1 tablet (5 mg total) by mouth every 4 (four) hours as needed for severe pain. 04/17/23  Yes Rondel Baton, MD  sulfamethoxazole-trimethoprim (BACTRIM DS) 800-160 MG tablet Take 1 tablet by mouth 2 (two) times daily for 7 days. 04/17/23 04/24/23 Yes Rondel Baton, MD  Accu-Chek Softclix Lancets lancets daily. 12/15/22   [provider]  albuterol (ACCUNEB) 1.25 MG/3ML nebulizer solution Take 1 ampule by nebulization every 6 (six) hours as needed for wheezing.    [provider]  albuterol (VENTOLIN HFA) 108 (90 Base) MCG/ACT inhaler Inhale into the lungs every 6 (six)  hours as needed for wheezing or shortness of breath.    [provider]  Blood Glucose Monitoring Suppl (MM EASY TOUCH GLUCOSE METER) w/Device KIT Please check your blood sugar daily, in the morning. 12/08/22   Marolyn Haller, MD  busPIRone (BUSPAR) 10 MG tablet Take 3 tablets (30 mg total) by mouth daily. 11/25/22   Marolyn Haller, MD  cyclobenzaprine (FLEXERIL) 10 MG tablet Take 1 tablet (10 mg total) by mouth 3 (three) times daily as needed for muscle spasms. 03/09/23   Steffanie Rainwater, MD  medroxyPROGESTERone (DEPO-PROVERA) 150 MG/ML injection Inject 1 mL every 3 months by intramuscular route.    [provider]  norethindrone (AYGESTIN) 5 MG tablet Take 5 mg by mouth daily. 12/11/22   [provider]  ORILISSA 150 MG TABS Take 1 tablet by mouth daily. Patient not taking: Reported on 03/15/2023 12/12/22   [provider]  pregabalin (LYRICA) 50 MG capsule Take 1 capsule (50 mg total) by mouth 3 (three) times daily. 01/12/23   Marolyn Haller, MD  Semaglutide,0.25 or 0.5MG /DOS, 2 MG/3ML SOPN Inject 0.25 mg weekly for 4 weeks then 0.5 mg weekly Patient not taking: Reported on 03/15/2023 03/09/23   Steffanie Rainwater, MD  Vitamin D, Ergocalciferol, (DRISDOL) 1.25 MG (50000 UNIT) CAPS capsule TAKE 1 CAPSULE BY MOUTH EVERY 7 DAYS Patient not taking: Reported on 03/11/2023 02/19/23   Marolyn Haller, MD      Allergies    Clindamycin/lincomycin, Gabapentin, Other, Chlorpheniramine-phenylephrine,  and Pregabalin    Review of Systems   Review of Systems  Physical Exam Updated Vital Signs BP (!) 131/101 (BP Location: Right Wrist)   Pulse (!) 101   Temp 98.5 F (36.9 C) (Oral)   Resp 16   SpO2 100%  Physical Exam Vitals and nursing note reviewed.  Constitutional:      General: She is not in acute distress.    Appearance: She is well-developed.  HENT:     Head: Normocephalic and atraumatic.     Right Ear: External ear normal.     Left Ear: External ear  normal.     Nose: Nose normal.  Eyes:     Extraocular Movements: Extraocular movements intact.     Conjunctiva/sclera: Conjunctivae normal.     Pupils: Pupils are equal, round, and reactive to light.  Cardiovascular:     Rate and Rhythm: Normal rate and regular rhythm.     Comments: Chaperone present for breast exam.  Erythema and induration and fluctuance palpated at the 2 o'clock position as well as beneath the nipple.  No discharge expressed from the nipple.  No enlarged lymph nodes or masses in the chest otherwise or axilla. Pulmonary:     Effort: Pulmonary effort is normal. No respiratory distress.  Musculoskeletal:     Cervical back: Normal range of motion and neck supple.     Right lower leg: No edema.     Left lower leg: No edema.  Skin:    General: Skin is warm and dry.  Neurological:     Mental Status: She is alert and oriented to person, place, and time. Mental status is at baseline.  Psychiatric:        Mood and Affect: Mood normal.     ED Results / Procedures / Treatments   Labs (all labs ordered are listed, but only abnormal results are displayed) Labs Reviewed - No data to display  EKG None  Radiology [place] LIMITED ULTRASOUND INCLUDING AXILLA RIGHT BREAST  Result Date: 04/17/2023 CLINICAL DATA:  History of breast abscess. Now with retroareolar pain. EXAM: LIMITED ULTRASOUND OF THE RIGHT BREAST COMPARISON:  03/18/2023 FINDINGS: Targeted ultrasound is performed, showing retroareolar complex hypoechoic structure measuring 2.8 x 0.6 x 1.8 cm, similar to 2.4 x 1.3 x 1.5 cm structure seen on the previous study. Overlying skin thickening evident. IMPRESSION: Complex hypoechoic lesion deep to the right nipple shows enhanced through transmission. Small recurrent abscess a concern. RECOMMENDATION: Referral to diagnostic mammography imaging center recommended. Electronically Signed   By: Kennith Center M.D.   On: 04/17/2023 13:07    Procedures Procedures   EMERGENCY DEPARTMENT  US SOFT TISSUE INTERPRETATION "Study: Limited Soft Tissue Ultrasound"  INDICATIONS: Soft tissue infection Multiple views of the body part were obtained in real-time with a multi-frequency linear probe  PERFORMED BY: Myself IMAGES ARCHIVED?: No SIDE:Right  BODY PART:Breast INTERPRETATION:  Abcess present    Medications Ordered in ED Medications - No data to display  ED Course/ Medical Decision Making/ A&P                             Medical Decision Making Amount and/or Complexity of Data Reviewed Radiology: ordered.  Risk Prescription drug management.   Jade Lopez is a 27 y.o. female with comorbidities that complicate the patient evaluation including breast abscesses who presents to the emergency department with right breast pain  Initial Ddx:  Breast abscess, cellulitis, breast cancer  MDM:  Feel the patient likely has a breast abscess that did not respond completely to needle aspiration and antibiotics will likely need to be drained.  Will give her antibiotic prescription and pain medication and have her follow-up with the breast surgeons about possible I&D and further evaluation for underlying breast cancer.  Will obtain formal ultrasound today as well with point-of-care ultrasound showing likely fluid collection.  Plan:  Breast ultrasound General surgery consult  ED Summary/Re-evaluation:  Formal breast ultrasound did confirm likely abscess.  Discussed with Dr. Dossie Der who recommends having her follow-up with Dr. Dwain Sarna and a breast center regarding abscess removal.  Was given a prescription of Bactrim to take for her infection to prevent worsening.  Was also instructed to take Tylenol, ibuprofen, and oxycodone for any breakthrough pain.  This patient presents to the ED for concern of complaints listed in HPI, this involves an extensive number of treatment options, and is a complaint that carries with it a high risk of complications and morbidity.  Disposition including potential need for admission considered.   Dispo: DC Home. Return precautions discussed including, but not limited to, those listed in the AVS. Allowed pt time to ask questions which were answered fully prior to dc.  Records reviewed ED Visit Notes I independently reviewed the following imaging with scope of interpretation limited to determining acute life threatening conditions related to emergency care:  [place]  and agree with the radiologist interpretation with the following exceptions: none I personally reviewed and interpreted cardiac monitoring: normal sinus rhythm  I have reviewed the patients home medications and made adjustments as needed Consults: General Surgery        Final Clinical Impression(s) / ED Diagnoses Final diagnoses:  Abscess of breast    Rx / DC Orders ED Discharge Orders          Ordered    sulfamethoxazole-trimethoprim (BACTRIM DS) 800-160 MG tablet  2 times daily        04/17/23 1312    oxyCODONE (ROXICODONE) 5 MG immediate release tablet  Every 4 hours PRN        04/17/23 1312              Rondel Baton, MD 04/17/23 1317

## 2023-04-17 NOTE — Discharge Instructions (Signed)
You were seen for your breast abscess in the emergency department.   At home, please take the Bactrim we have prescribed you to prevent worsening infection.  Take Tylenol and ibuprofen for your pain. You may also take the oxycodone we have prescribed you for any breakthrough pain that may have.  Do not take this before driving or operating heavy machinery.  Do not take this medication with alcohol.  Call the breast surgeons soon as possible to set up an appointment to have your breast abscess drained.  Return immediately to the emergency department if you experience any of the following: High fevers, worsening pain, or any other concerning symptoms.    Thank you for visiting our Emergency Department. It was a pleasure taking care of you today.

## 2023-04-17 NOTE — ED Notes (Signed)
DC instructions reviewed with pt. PT verbalized understanding. PT DC °

## 2023-04-17 NOTE — ED Triage Notes (Addendum)
Pt reports right breast pain. Pt seen for abscess to the same breast recently.

## 2023-04-19 ENCOUNTER — Encounter: Payer: Self-pay | Admitting: Family Medicine

## 2023-04-19 ENCOUNTER — Other Ambulatory Visit: Payer: BC Managed Care – PPO

## 2023-04-19 NOTE — Telephone Encounter (Signed)
The letter came through today :     Reason for denial : an  additional  clinical  pharmacist  review  determined that the request does not  meet  the definition  of medical  necessity  found  member' benefit booklet ...       Jade Lopez  I am placing it in your box for when you come in the office on Wednesday )

## 2023-04-21 NOTE — Telephone Encounter (Signed)
Per denial letter 04/18/23:

## 2023-04-21 NOTE — Telephone Encounter (Signed)
Pt called me back regarding her appeal denial. Pt says not only does she have an intolerance to the pregabalin, she also cannot take the preferred alternative Savella. Pt says she gets tachycardia and that this is one of the side effects. I informed patient that this wasn't listed anywhere on her chart. Pt says she told Dr. Montez Morita recently. This would need to be documented for me to submit this info with another appeal. I also mentioned to the patient that insurance says this medication is covered when the member is being treated for a seizure-related or refractory psychiatric disorder.   Her dx is for fibromyalgia and this is a continuation of therapy for the patient. I will attempt to send the appeal once (or if) any of this information could be documented somehow on her chart.

## 2023-04-22 ENCOUNTER — Ambulatory Visit
Admission: RE | Admit: 2023-04-22 | Discharge: 2023-04-22 | Disposition: A | Discharge status: Home / Self Care | Payer: BC Managed Care – PPO | Source: Ambulatory Visit | Attending: Family Medicine | Admitting: Family Medicine

## 2023-04-22 DIAGNOSIS — N63 Unspecified lump in unspecified breast: Secondary | ICD-10-CM

## 2023-04-22 DIAGNOSIS — N611 Abscess of the breast and nipple: Secondary | ICD-10-CM | POA: Diagnosis not present

## 2023-04-24 DIAGNOSIS — F4323 Adjustment disorder with mixed anxiety and depressed mood: Secondary | ICD-10-CM | POA: Diagnosis not present

## 2023-04-27 ENCOUNTER — Other Ambulatory Visit (HOSPITAL_COMMUNITY): Payer: Self-pay

## 2023-04-27 NOTE — Telephone Encounter (Signed)
Resubmitted appeal to [employer] Rights and Appeals Dept.

## 2023-05-01 DIAGNOSIS — F4323 Adjustment disorder with mixed anxiety and depressed mood: Secondary | ICD-10-CM | POA: Diagnosis not present

## 2023-05-03 ENCOUNTER — Telehealth: Payer: Self-pay | Admitting: Neurology

## 2023-05-03 NOTE — Telephone Encounter (Signed)
Pt is needing to schedule her Initial Cpap appt between the dates of 05/17/23-07/17/23 Called and LVM for pt to call back to schedule.

## 2023-05-04 NOTE — Telephone Encounter (Signed)
Pt was scheduled for her initial CPAP on (06/10/23) Pt was informed to bring machine and power cord to the appointment.   DME  in pt's SnapShot, and between dates are :05/17/23-07/17/23

## 2023-05-06 ENCOUNTER — Other Ambulatory Visit: Payer: Self-pay | Admitting: *Deleted

## 2023-05-07 ENCOUNTER — Observation Stay (HOSPITAL_COMMUNITY)
Admission: EM | Admit: 2023-05-07 | Discharge: 2023-05-10 | Disposition: A | Discharge status: Home / Self Care | Payer: BC Managed Care – PPO | Attending: Internal Medicine | Admitting: Internal Medicine

## 2023-05-07 ENCOUNTER — Emergency Department (HOSPITAL_COMMUNITY): Payer: BC Managed Care – PPO

## 2023-05-07 ENCOUNTER — Other Ambulatory Visit: Payer: Self-pay

## 2023-05-07 DIAGNOSIS — J45901 Unspecified asthma with (acute) exacerbation: Secondary | ICD-10-CM | POA: Diagnosis not present

## 2023-05-07 DIAGNOSIS — G4733 Obstructive sleep apnea (adult) (pediatric): Secondary | ICD-10-CM | POA: Diagnosis not present

## 2023-05-07 DIAGNOSIS — E119 Type 2 diabetes mellitus without complications: Secondary | ICD-10-CM

## 2023-05-07 DIAGNOSIS — Z888 Allergy status to other drugs, medicaments and biological substances status: Secondary | ICD-10-CM

## 2023-05-07 DIAGNOSIS — J069 Acute upper respiratory infection, unspecified: Secondary | ICD-10-CM | POA: Diagnosis present

## 2023-05-07 DIAGNOSIS — R1084 Generalized abdominal pain: Secondary | ICD-10-CM

## 2023-05-07 DIAGNOSIS — Z91199 Patient's noncompliance with other medical treatment and regimen due to unspecified reason: Secondary | ICD-10-CM | POA: Diagnosis not present

## 2023-05-07 DIAGNOSIS — N809 Endometriosis, unspecified: Secondary | ICD-10-CM | POA: Diagnosis present

## 2023-05-07 DIAGNOSIS — R197 Diarrhea, unspecified: Secondary | ICD-10-CM | POA: Diagnosis present

## 2023-05-07 DIAGNOSIS — R9431 Abnormal electrocardiogram [ECG] [EKG]: Secondary | ICD-10-CM | POA: Diagnosis not present

## 2023-05-07 DIAGNOSIS — E1165 Type 2 diabetes mellitus with hyperglycemia: Secondary | ICD-10-CM | POA: Diagnosis not present

## 2023-05-07 DIAGNOSIS — Z9049 Acquired absence of other specified parts of digestive tract: Secondary | ICD-10-CM | POA: Diagnosis not present

## 2023-05-07 DIAGNOSIS — R109 Unspecified abdominal pain: Secondary | ICD-10-CM | POA: Diagnosis not present

## 2023-05-07 DIAGNOSIS — Z833 Family history of diabetes mellitus: Secondary | ICD-10-CM | POA: Diagnosis not present

## 2023-05-07 DIAGNOSIS — E860 Dehydration: Secondary | ICD-10-CM | POA: Diagnosis not present

## 2023-05-07 DIAGNOSIS — R55 Syncope and collapse: Secondary | ICD-10-CM | POA: Diagnosis not present

## 2023-05-07 DIAGNOSIS — Z881 Allergy status to other antibiotic agents status: Secondary | ICD-10-CM

## 2023-05-07 DIAGNOSIS — M79643 Pain in unspecified hand: Secondary | ICD-10-CM | POA: Diagnosis not present

## 2023-05-07 DIAGNOSIS — R102 Pelvic and perineal pain: Secondary | ICD-10-CM | POA: Diagnosis not present

## 2023-05-07 DIAGNOSIS — G8929 Other chronic pain: Secondary | ICD-10-CM | POA: Diagnosis present

## 2023-05-07 DIAGNOSIS — Z8249 Family history of ischemic heart disease and other diseases of the circulatory system: Secondary | ICD-10-CM

## 2023-05-07 DIAGNOSIS — M797 Fibromyalgia: Secondary | ICD-10-CM | POA: Diagnosis present

## 2023-05-07 DIAGNOSIS — R110 Nausea: Secondary | ICD-10-CM | POA: Diagnosis not present

## 2023-05-07 DIAGNOSIS — Z713 Dietary counseling and surveillance: Secondary | ICD-10-CM

## 2023-05-07 DIAGNOSIS — E66813 Obesity, class 3: Secondary | ICD-10-CM | POA: Diagnosis present

## 2023-05-07 DIAGNOSIS — R1032 Left lower quadrant pain: Secondary | ICD-10-CM | POA: Diagnosis not present

## 2023-05-07 DIAGNOSIS — R000 Tachycardia, unspecified: Secondary | ICD-10-CM | POA: Diagnosis present

## 2023-05-07 DIAGNOSIS — Z794 Long term (current) use of insulin: Secondary | ICD-10-CM | POA: Diagnosis not present

## 2023-05-07 DIAGNOSIS — Z79899 Other long term (current) drug therapy: Secondary | ICD-10-CM

## 2023-05-07 DIAGNOSIS — Z6841 Body Mass Index (BMI) 40.0 and over, adult: Secondary | ICD-10-CM

## 2023-05-07 DIAGNOSIS — R1031 Right lower quadrant pain: Secondary | ICD-10-CM | POA: Diagnosis not present

## 2023-05-07 DIAGNOSIS — R509 Fever, unspecified: Secondary | ICD-10-CM | POA: Diagnosis not present

## 2023-05-07 LAB — COMPREHENSIVE METABOLIC PANEL
ALT: 16 U/L (ref 0–44)
AST: 13 U/L — ABNORMAL LOW (ref 15–41)
Albumin: 3.7 g/dL (ref 3.5–5.0)
Alkaline Phosphatase: 103 U/L (ref 38–126)
Anion gap: 9 (ref 5–15)
BUN: 11 mg/dL (ref 6–20)
CO2: 23 mmol/L (ref 22–32)
Calcium: 9.1 mg/dL (ref 8.9–10.3)
Chloride: 105 mmol/L (ref 98–111)
Creatinine, Ser: 0.77 mg/dL (ref 0.44–1.00)
GFR, Estimated: >60 mL/min (ref >60)
Glucose, Bld: 143 mg/dL — ABNORMAL HIGH (ref 70–99)
Potassium: 3.8 mmol/L (ref 3.5–5.1)
Sodium: 137 mmol/L (ref 135–145)
Total Bilirubin: 0.5 mg/dL (ref 0.3–1.2)
Total Protein: 8.2 g/dL — ABNORMAL HIGH (ref 6.5–8.1)

## 2023-05-07 LAB — CBC WITH DIFFERENTIAL/PLATELET
Abs Immature Granulocytes: 0.04 10*3/uL (ref 0.00–0.07)
Basophils Absolute: 0.1 10*3/uL (ref 0.0–0.1)
Basophils Relative: 1 %
Eosinophils Absolute: 0.2 10*3/uL (ref 0.0–0.5)
Eosinophils Relative: 2 %
HCT: 47.2 % — ABNORMAL HIGH (ref 36.0–46.0)
Hemoglobin: 14.5 g/dL (ref 12.0–15.0)
Immature Granulocytes: 0 %
Lymphocytes Relative: 36 %
Lymphs Abs: 3.7 10*3/uL (ref 0.7–4.0)
MCH: 25.3 pg — ABNORMAL LOW (ref 26.0–34.0)
MCHC: 30.7 g/dL (ref 30.0–36.0)
MCV: 82.2 fL (ref 80.0–100.0)
Monocytes Absolute: 0.7 10*3/uL (ref 0.1–1.0)
Monocytes Relative: 6 %
Neutro Abs: 5.7 10*3/uL (ref 1.7–7.7)
Neutrophils Relative %: 55 %
Platelets: 330 10*3/uL (ref 150–400)
RBC: 5.74 MIL/uL — ABNORMAL HIGH (ref 3.87–5.11)
RDW: 14.8 % (ref 11.5–15.5)
WBC: 10.3 10*3/uL (ref 4.0–10.5)
nRBC: 0 % (ref 0.0–0.2)

## 2023-05-07 LAB — LACTIC ACID, PLASMA
Lactic Acid, Venous: 1.5 mmol/L (ref 0.5–1.9)
Lactic Acid, Venous: 1.1 mmol/L (ref 0.5–1.9)

## 2023-05-07 LAB — D-DIMER, QUANTITATIVE: D-Dimer, Quant: 0.39 ug/mL-FEU (ref 0.00–0.50)

## 2023-05-07 LAB — HCG, QUANTITATIVE, PREGNANCY: hCG, Beta Chain, Quant, S: <1 m[IU]/mL (ref <5)

## 2023-05-07 MED ORDER — HYDROMORPHONE HCL 1 MG/ML IJ SOLN
1.0000 mg | Freq: Once | INTRAMUSCULAR | Status: AC
  Administered 2023-05-07: 1 mg via INTRAVENOUS
  Filled 2023-05-07: qty 1

## 2023-05-07 MED ORDER — LACTATED RINGERS IV BOLUS
1000.0000 mL | Freq: Once | INTRAVENOUS | Status: AC
  Administered 2023-05-07: 1000 mL via INTRAVENOUS

## 2023-05-07 MED ORDER — IOHEXOL 300 MG/ML  SOLN
100.0000 mL | Freq: Once | INTRAMUSCULAR | Status: AC | PRN
  Administered 2023-05-07: 100 mL via INTRAVENOUS

## 2023-05-07 NOTE — ED Notes (Addendum)
Patient was ambulating to bathroom when she became weak and had syncopal episode. RN able to catch patient and help lower her to ground. Patient did not LOC or hit head on objects, no injures resulted from episode. After 3 episodes, staff assisted and we were able to guide patient back to bed, provider notified and PA at bedside aware.

## 2023-05-07 NOTE — ED Triage Notes (Signed)
Patient brought in by EMS from home stating she fainted. She c/o of pelvic pain that has been going on for months. She currently sees specialist at [facility] for pelvic pain. She denies recent or current STIs/STDs. States she had a fever earlier but took Tylenol at 1200. She has HX Type 2 Diabetes and HX of Lupus with joint pain.  138/74 104 18 98.1 CBG: 180

## 2023-05-07 NOTE — ED Notes (Addendum)
Pt asked to use the restroom. Pt stood up and walked 3 steps and passed out and fell to the floor. Pt didn't hit her head. Pt proceeded to fall out two more times as we were trying to get pt back in bed.

## 2023-05-07 NOTE — ED Provider Notes (Signed)
[place] EMERGENCY DEPARTMENT AT [facility] Provider Note   CSN: [id number] Arrival date & time: 05/07/23  1500     History Chief Complaint  Patient presents with  . Near Syncope    Jade Lopez is a 27 y.o. female.  Patient with past history significant for type 2 diabetes, lupus, endometriosis, fibromyalgia who presents to the emergency department complaints of near syncope.  Patient reports this been ongoing for the last few days and has been associated with some increasing pelvic pain that is been ongoing for several months as well.  Currently being seen by an OBGYN at [facility].  Endorses fevers at home as high as 103 F but took 1000 mg of Tylenol around 1200 prior to arrival.  Denies significant abdominal pain, nausea, vomiting, diarrhea.   Near Syncope      Home Medications Prior to Admission medications   Medication Sig Start Date End Date Taking? Authorizing Provider  Accu-Chek Softclix Lancets lancets daily. 12/15/22   [provider]  albuterol (ACCUNEB) 1.25 MG/3ML nebulizer solution Take 1 ampule by nebulization every 6 (six) hours as needed for wheezing.    [provider]  albuterol (VENTOLIN HFA) 108 (90 Base) MCG/ACT inhaler Inhale into the lungs every 6 (six) hours as needed for wheezing or shortness of breath.    [provider]  Blood Glucose Monitoring Suppl (MM EASY TOUCH GLUCOSE METER) w/Device KIT Please check your blood sugar daily, in the morning. 12/08/22   Marolyn Haller, MD  busPIRone (BUSPAR) 10 MG tablet Take 3 tablets (30 mg total) by mouth daily. 11/25/22   Marolyn Haller, MD  cyclobenzaprine (FLEXERIL) 10 MG tablet Take 1 tablet (10 mg total) by mouth 3 (three) times daily as needed for muscle spasms. 03/09/23   Steffanie Rainwater, MD  medroxyPROGESTERone (DEPO-PROVERA) 150 MG/ML injection Inject 1 mL every 3 months by intramuscular route.    [provider]  norethindrone (AYGESTIN) 5 MG tablet Take 5  mg by mouth daily. 12/11/22   [provider]  ORILISSA 150 MG TABS Take 1 tablet by mouth daily. Patient not taking: Reported on 03/15/2023 12/12/22   [provider]  oxyCODONE (ROXICODONE) 5 MG immediate release tablet Take 1 tablet (5 mg total) by mouth every 4 (four) hours as needed for severe pain. 04/17/23   Rondel Baton, MD  pregabalin (LYRICA) 50 MG capsule Take 1 capsule (50 mg total) by mouth 3 (three) times daily. 01/12/23   Marolyn Haller, MD  Semaglutide,0.25 or 0.5MG /DOS, 2 MG/3ML SOPN Inject 0.25 mg weekly for 4 weeks then 0.5 mg weekly Patient not taking: Reported on 03/15/2023 03/09/23   Steffanie Rainwater, MD  Vitamin D, Ergocalciferol, (DRISDOL) 1.25 MG (50000 UNIT) CAPS capsule TAKE 1 CAPSULE BY MOUTH EVERY 7 DAYS Patient not taking: Reported on 03/11/2023 02/19/23   Marolyn Haller, MD      Allergies    Clindamycin/lincomycin, Gabapentin, Other, Chlorpheniramine-phenylephrine, and Pregabalin    Review of Systems   Review of Systems  Cardiovascular:  Positive for near-syncope.    Physical Exam Updated Vital Signs BP 111/79   Pulse (!) 109   Temp 98.7 F (37.1 C) (Oral)   Ht 5' 4.5" (1.638 m)   Wt (!) 172.4 kg   LMP 05/07/2023 (Exact Date)   SpO2 100%   BMI 64.22 kg/m  Physical Exam  ED Results / Procedures / Treatments   Labs (all labs ordered are listed, but only abnormal results are displayed) Labs Reviewed  CBC WITH DIFFERENTIAL/PLATELET  COMPREHENSIVE METABOLIC PANEL  HCG, QUANTITATIVE, PREGNANCY  URINALYSIS, W/ REFLEX TO CULTURE (INFECTION SUSPECTED)    EKG None  Radiology No results found.  Procedures Procedures   Medications Ordered in ED Medications  lactated ringers bolus 1,000 mL (1,000 mLs Intravenous New Bag/Given 05/07/23 1628)    ED Course/ Medical Decision Making/ A&P                           Medical Decision Making Amount and/or Complexity of Data Reviewed Labs: ordered. Radiology:  ordered.  Risk Prescription drug management.   This patient presents to the ED for concern of *** differential diagnosis includes ***    Additional history obtained:  Additional history obtained from *** External records from outside source obtained and reviewed including ***   Lab Tests:  I Ordered, and personally interpreted labs.  The pertinent results include:  ***   Imaging Studies ordered:  I ordered imaging studies including ***  I independently visualized and interpreted imaging which showed *** I agree with the radiologist interpretation   Medicines ordered and prescription drug management:  I ordered medication including ***  for ***  Reevaluation of the patient after these medicines showed that the patient {resolved/improved/worsened:23923::"improved"} I have reviewed the patients home medicines and have made adjustments as needed   Problem List / ED Course:  Patient here to the ED with concerns of near syncope and weakness. Reportedly had two episodes of syncope here in the ED when attempting to walk to the bathroom, but was not on a monitor at the time of these episodes.    Social Determinants of Health:    Final Clinical Impression(s) / ED Diagnoses Final diagnoses:  None    Rx / DC Orders ED Discharge Orders     None

## 2023-05-07 NOTE — ED Notes (Signed)
pt aware urine sample needed. Pt attempted but not successful.

## 2023-05-08 ENCOUNTER — Encounter (HOSPITAL_COMMUNITY): Payer: Self-pay | Admitting: Internal Medicine

## 2023-05-08 ENCOUNTER — Observation Stay (HOSPITAL_BASED_OUTPATIENT_CLINIC_OR_DEPARTMENT_OTHER): Payer: BC Managed Care – PPO

## 2023-05-08 DIAGNOSIS — J069 Acute upper respiratory infection, unspecified: Secondary | ICD-10-CM | POA: Diagnosis present

## 2023-05-08 DIAGNOSIS — R55 Syncope and collapse: Secondary | ICD-10-CM | POA: Diagnosis not present

## 2023-05-08 DIAGNOSIS — J45901 Unspecified asthma with (acute) exacerbation: Secondary | ICD-10-CM | POA: Diagnosis present

## 2023-05-08 LAB — CBC WITH DIFFERENTIAL/PLATELET
Abs Immature Granulocytes: 0.04 10*3/uL (ref 0.00–0.07)
Basophils Absolute: 0.1 10*3/uL (ref 0.0–0.1)
Basophils Relative: 1 %
Eosinophils Absolute: 0.1 10*3/uL (ref 0.0–0.5)
Eosinophils Relative: 1 %
HCT: 39.1 % (ref 36.0–46.0)
Hemoglobin: 12.1 g/dL (ref 12.0–15.0)
Immature Granulocytes: 0 %
Lymphocytes Relative: 37 %
Lymphs Abs: 3.7 10*3/uL (ref 0.7–4.0)
MCH: 25.4 pg — ABNORMAL LOW (ref 26.0–34.0)
MCHC: 30.9 g/dL (ref 30.0–36.0)
MCV: 82.1 fL (ref 80.0–100.0)
Monocytes Absolute: 0.7 10*3/uL (ref 0.1–1.0)
Monocytes Relative: 7 %
Neutro Abs: 5.6 10*3/uL (ref 1.7–7.7)
Neutrophils Relative %: 54 %
Platelets: 270 10*3/uL (ref 150–400)
RBC: 4.76 MIL/uL (ref 3.87–5.11)
RDW: 14.9 % (ref 11.5–15.5)
WBC: 10.2 10*3/uL (ref 4.0–10.5)
nRBC: 0 % (ref 0.0–0.2)

## 2023-05-08 LAB — COMPREHENSIVE METABOLIC PANEL
ALT: 11 U/L (ref 0–44)
ALT: 19 U/L (ref 0–44)
AST: 11 U/L — ABNORMAL LOW (ref 15–41)
AST: 14 U/L — ABNORMAL LOW (ref 15–41)
Albumin: 2.2 g/dL — ABNORMAL LOW (ref 3.5–5.0)
Albumin: 3.9 g/dL (ref 3.5–5.0)
Alkaline Phosphatase: 60 U/L (ref 38–126)
Alkaline Phosphatase: 104 U/L (ref 38–126)
Anion gap: 4 — ABNORMAL LOW (ref 5–15)
Anion gap: 10 (ref 5–15)
BUN: 8 mg/dL (ref 6–20)
BUN: 9 mg/dL (ref 6–20)
CO2: 17 mmol/L — ABNORMAL LOW (ref 22–32)
CO2: 20 mmol/L — ABNORMAL LOW (ref 22–32)
Calcium: 6 mg/dL — CL (ref 8.9–10.3)
Calcium: 9.4 mg/dL (ref 8.9–10.3)
Chloride: 116 mmol/L — ABNORMAL HIGH (ref 98–111)
Chloride: 105 mmol/L (ref 98–111)
Creatinine, Ser: 0.55 mg/dL (ref 0.44–1.00)
Creatinine, Ser: 0.74 mg/dL (ref 0.44–1.00)
GFR, Estimated: >60 mL/min (ref >60)
GFR, Estimated: >60 mL/min (ref >60)
Glucose, Bld: 151 mg/dL — ABNORMAL HIGH (ref 70–99)
Glucose, Bld: 208 mg/dL — ABNORMAL HIGH (ref 70–99)
Potassium: 2.5 mmol/L — CL (ref 3.5–5.1)
Potassium: 5 mmol/L (ref 3.5–5.1)
Sodium: 137 mmol/L (ref 135–145)
Sodium: 135 mmol/L (ref 135–145)
Total Bilirubin: 0.4 mg/dL (ref 0.3–1.2)
Total Bilirubin: 0.3 mg/dL (ref 0.3–1.2)
Total Protein: 4.9 g/dL — ABNORMAL LOW (ref 6.5–8.1)
Total Protein: 8.2 g/dL — ABNORMAL HIGH (ref 6.5–8.1)

## 2023-05-08 LAB — ECHOCARDIOGRAM COMPLETE
Calc EF: 54.4 %
Height: 64.5 in
S' Lateral: 3 cm
Single Plane A2C EF: 55.3 %
Single Plane A4C EF: 50.8 %
Weight: 6080 oz

## 2023-05-08 LAB — GLUCOSE, CAPILLARY: Glucose-Capillary: 342 mg/dL — ABNORMAL HIGH (ref 70–99)

## 2023-05-08 LAB — RENAL FUNCTION PANEL
Albumin: 4 g/dL (ref 3.5–5.0)
Anion gap: 10 (ref 5–15)
BUN: 9 mg/dL (ref 6–20)
CO2: 19 mmol/L — ABNORMAL LOW (ref 22–32)
Calcium: 9.3 mg/dL (ref 8.9–10.3)
Chloride: 105 mmol/L (ref 98–111)
Creatinine, Ser: 0.79 mg/dL (ref 0.44–1.00)
GFR, Estimated: >60 mL/min
Glucose, Bld: 207 mg/dL — ABNORMAL HIGH (ref 70–99)
Phosphorus: 3.2 mg/dL (ref 2.5–4.6)
Potassium: 4.9 mmol/L (ref 3.5–5.1)
Sodium: 134 mmol/L — ABNORMAL LOW (ref 135–145)

## 2023-05-08 LAB — MAGNESIUM: Magnesium: 1.3 mg/dL — ABNORMAL LOW (ref 1.7–2.4)

## 2023-05-08 MED ORDER — ENSURE ENLIVE PO LIQD
237.0000 mL | Freq: Two times a day (BID) | ORAL | Status: DC
  Administered 2023-05-09 – 2023-05-10 (×3): 237 mL via ORAL

## 2023-05-08 MED ORDER — MAGNESIUM SULFATE 4 GM/100ML IV SOLN
4.0000 g | Freq: Once | INTRAVENOUS | Status: AC
  Administered 2023-05-08: 4 g via INTRAVENOUS
  Filled 2023-05-08: qty 100

## 2023-05-08 MED ORDER — NALOXONE HCL 0.4 MG/ML IJ SOLN: 0.4000 mg | INTRAMUSCULAR | Status: DC | PRN

## 2023-05-08 MED ORDER — NORETHINDRONE ACETATE 5 MG PO TABS
10.0000 mg | ORAL_TABLET | Freq: Every evening | ORAL | Status: DC
  Administered 2023-05-08 – 2023-05-09 (×2): 10 mg via ORAL
  Filled 2023-05-08 (×3): qty 2

## 2023-05-08 MED ORDER — ONDANSETRON HCL 4 MG/2ML IJ SOLN
4.0000 mg | Freq: Four times a day (QID) | INTRAMUSCULAR | Status: DC | PRN
  Administered 2023-05-08: 4 mg via INTRAVENOUS
  Filled 2023-05-08: qty 2

## 2023-05-08 MED ORDER — METHYLPREDNISOLONE SODIUM SUCC 125 MG IJ SOLR
125.0000 mg | Freq: Once | INTRAMUSCULAR | Status: AC
  Administered 2023-05-08: 125 mg via INTRAVENOUS
  Filled 2023-05-08: qty 2

## 2023-05-08 MED ORDER — CALCIUM GLUCONATE-NACL 2-0.675 GM/100ML-% IV SOLN
2.0000 g | Freq: Once | INTRAVENOUS | Status: AC
  Administered 2023-05-08: 2000 mg via INTRAVENOUS
  Filled 2023-05-08 (×2): qty 100

## 2023-05-08 MED ORDER — PROCHLORPERAZINE EDISYLATE 10 MG/2ML IJ SOLN: 10.0000 mg | Freq: Once | INTRAMUSCULAR | Status: DC

## 2023-05-08 MED ORDER — IPRATROPIUM-ALBUTEROL 0.5-2.5 (3) MG/3ML IN SOLN
3.0000 mL | Freq: Four times a day (QID) | RESPIRATORY_TRACT | Status: DC
  Administered 2023-05-08 – 2023-05-10 (×7): 3 mL via RESPIRATORY_TRACT
  Filled 2023-05-08 (×8): qty 3

## 2023-05-08 MED ORDER — PANTOPRAZOLE SODIUM 40 MG IV SOLR
40.0000 mg | Freq: Once | INTRAVENOUS | Status: AC
  Administered 2023-05-08: 40 mg via INTRAVENOUS
  Filled 2023-05-08: qty 10

## 2023-05-08 MED ORDER — HYDROMORPHONE HCL 1 MG/ML IJ SOLN
0.5000 mg | INTRAMUSCULAR | Status: DC | PRN
  Administered 2023-05-08: 0.5 mg via INTRAVENOUS
  Filled 2023-05-08: qty 1

## 2023-05-08 MED ORDER — MAGNESIUM SULFATE 50 % IJ SOLN: 3.0000 g | Freq: Once | INTRAVENOUS | Status: DC

## 2023-05-08 MED ORDER — POTASSIUM CHLORIDE CRYS ER 20 MEQ PO TBCR
40.0000 meq | EXTENDED_RELEASE_TABLET | Freq: Once | ORAL | Status: AC
  Administered 2023-05-08: 40 meq via ORAL
  Filled 2023-05-08: qty 2

## 2023-05-08 MED ORDER — MELATONIN 3 MG PO TABS
3.0000 mg | ORAL_TABLET | Freq: Every evening | ORAL | Status: DC | PRN
  Administered 2023-05-09: 3 mg via ORAL
  Filled 2023-05-08: qty 1

## 2023-05-08 MED ORDER — HYDROMORPHONE HCL 1 MG/ML IJ SOLN
1.0000 mg | Freq: Once | INTRAMUSCULAR | Status: AC
  Administered 2023-05-08: 1 mg via INTRAVENOUS
  Filled 2023-05-08: qty 1

## 2023-05-08 MED ORDER — ACETAMINOPHEN 650 MG RE SUPP: 650.0000 mg | Freq: Four times a day (QID) | RECTAL | Status: DC | PRN

## 2023-05-08 MED ORDER — ACETAMINOPHEN 325 MG PO TABS
650.0000 mg | ORAL_TABLET | Freq: Four times a day (QID) | ORAL | Status: DC | PRN
  Administered 2023-05-08 – 2023-05-10 (×5): 650 mg via ORAL
  Filled 2023-05-08 (×5): qty 2

## 2023-05-08 MED ORDER — INSULIN ASPART 100 UNIT/ML IJ SOLN: 0.0000 [IU] | Freq: Three times a day (TID) | INTRAMUSCULAR | Status: DC

## 2023-05-08 MED ORDER — HYDROMORPHONE HCL 1 MG/ML IJ SOLN
1.0000 mg | INTRAMUSCULAR | Status: DC | PRN
  Administered 2023-05-08 – 2023-05-09 (×6): 1 mg via INTRAVENOUS
  Filled 2023-05-08 (×6): qty 1

## 2023-05-08 MED ORDER — SODIUM CHLORIDE 0.9 % IV SOLN: INTRAVENOUS | Status: AC

## 2023-05-08 MED ORDER — BUDESONIDE 0.5 MG/2ML IN SUSP
0.5000 mg | Freq: Two times a day (BID) | RESPIRATORY_TRACT | Status: DC
  Administered 2023-05-09 – 2023-05-10 (×3): 0.5 mg via RESPIRATORY_TRACT
  Filled 2023-05-08 (×3): qty 2

## 2023-05-08 MED ORDER — PREGABALIN 50 MG PO CAPS
50.0000 mg | ORAL_CAPSULE | Freq: Three times a day (TID) | ORAL | Status: DC
  Filled 2023-05-08: qty 1

## 2023-05-08 NOTE — ED Notes (Signed)
ED TO INPATIENT HANDOFF REPORT  ED Nurse Name and Phone #: Suzanna Obey [phone]  S Name/Age/Gender Melven Sartorius 27 y.o. female Room/Bed: WA25/WA25  Code Status   Code Status: Full Code  Home/SNF/Other Home Patient oriented to: self, place, time, and situation Is this baseline? Yes   Triage Complete: Triage complete  Chief Complaint Syncope [R55]  Triage Note Patient brought in by EMS from home stating she fainted. She c/o of pelvic pain that has been going on for months. She currently sees specialist at [facility] for pelvic pain. She denies recent or current STIs/STDs. States she had a fever earlier but took Tylenol at 1200. She has HX Type 2 Diabetes and HX of Lupus with joint pain.  138/74 104 18 98.1 CBG: 180   Allergies Allergies  Allergen Reactions   Clindamycin/Lincomycin Other (See Comments), Hives, Palpitations, Rash and Swelling    Other Reaction(s): Racing heart beat   Gabapentin Hives, Nausea And Vomiting and Palpitations    Other Reaction(s): Unknown   Other Anaphylaxis    Allergic to Rynatan cough syrup.   Chlorpheniramine-Phenylephrine Nausea And Vomiting   Pregabalin Rash    Allergic to the generic     Level of Care/Admitting Diagnosis ED Disposition     ED Disposition  Admit   Condition  --   Comment  Hospital Area: [facility] [[id number]]  Level of Care: Telemetry [5]  Admit to tele based on following criteria: Monitor for Ischemic changes  May place patient in observation at [facility] or [facility] if equivalent level of care is available:: No  Covid Evaluation: Asymptomatic - no recent exposure (last 10 days) testing not required  Diagnosis: Syncope [206001]  Admitting Physician: Angie Fava [[id number]]  Attending Physician: Angie Fava [[id number]]          B Medical/Surgery History Past Medical History:  Diagnosis Date   Asthma    Autoimmune disease (HCC)    Blood clots in brain 2020   Class 3 obesity  (HCC)    Endometriosis    Endometritis    Fibromyalgia    Sleep apnea    Sleep related headaches 02/24/2023   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   T2DM (type 2 diabetes mellitus) (HCC)    TOA (tubo-ovarian abscess)    Past Surgical History:  Procedure Laterality Date   CHOLECYSTECTOMY     DILATATION AND CURETTAGE/HYSTEROSCOPY WITH MINERVA     3 total   TONSILLECTOMY     TONSILLECTOMY AND ADENOIDECTOMY       A IV Location/Drains/Wounds Patient Lines/Drains/Airways Status     Active Line/Drains/Airways     Name Placement date Placement time Site Days   Peripheral IV 05/07/23 20 G Left Antecubital 05/07/23  1627  Antecubital  1            Intake/Output Last 24 hours  Intake/Output Summary (Last 24 hours) at 05/08/2023 1001 Last data filed at 05/07/2023 1843 Gross per 24 hour  Intake 999 ml  Output --  Net 999 ml    Labs/Imaging Results for orders placed or performed during the hospital encounter of 05/07/23 (from the past 48 hour(s))  CBC with Differential     Status: Abnormal   Collection Time: 05/07/23  4:31 PM  Result Value Ref Range   WBC 10.3 4.0 - 10.5 K/uL   RBC 5.74 (H) 3.87 - 5.11 MIL/uL   Hemoglobin 14.5 12.0 - 15.0 g/dL   HCT 95.6 (H) 21.3 - 08.6 %   MCV 82.2  80.0 - 100.0 fL   MCH 25.3 (L) 26.0 - 34.0 pg   MCHC 30.7 30.0 - 36.0 g/dL   RDW 16.1 09.6 - 04.5 %   Platelets 330 150 - 400 K/uL   nRBC 0.0 0.0 - 0.2 %   Neutrophils Relative % 55 %   Neutro Abs 5.7 1.7 - 7.7 K/uL   Lymphocytes Relative 36 %   Lymphs Abs 3.7 0.7 - 4.0 K/uL   Monocytes Relative 6 %   Monocytes Absolute 0.7 0.1 - 1.0 K/uL   Eosinophils Relative 2 %   Eosinophils Absolute 0.2 0.0 - 0.5 K/uL   Basophils Relative 1 %   Basophils Absolute 0.1 0.0 - 0.1 K/uL   Immature Granulocytes 0 %   Abs Immature Granulocytes 0.04 0.00 - 0.07 K/uL    Comment: Performed at [facility], [address], [place], [place] [address]  Comprehensive metabolic panel     Status:  Abnormal   Collection Time: 05/07/23  4:31 PM  Result Value Ref Range   Sodium 137 135 - 145 mmol/L   Potassium 3.8 3.5 - 5.1 mmol/L   Chloride 105 98 - 111 mmol/L   CO2 23 22 - 32 mmol/L   Glucose, Bld 143 (H) 70 - 99 mg/dL    Comment: Glucose reference range applies only to samples taken after fasting for at least 8 hours.   BUN 11 6 - 20 mg/dL   Creatinine, Ser 1.91 0.44 - 1.00 mg/dL   Calcium 9.1 8.9 - 47.8 mg/dL   Total Protein 8.2 (H) 6.5 - 8.1 g/dL   Albumin 3.7 3.5 - 5.0 g/dL   AST 13 (L) 15 - 41 U/L   ALT 16 0 - 44 U/L   Alkaline Phosphatase 103 38 - 126 U/L   Total Bilirubin 0.5 0.3 - 1.2 mg/dL   GFR, Estimated >29 >56 mL/min    Comment: (NOTE) Calculated using the CKD-EPI Creatinine Equation (2021)    Anion gap 9 5 - 15    Comment: Performed at [facility], [address] [place], [place] [address]  hCG, quantitative, pregnancy     Status: None   Collection Time: 05/07/23  4:31 PM  Result Value Ref Range   hCG, Beta Chain, Quant, S <1 <5 mIU/mL    Comment:          GEST. AGE      CONC.  (mIU/mL)   <=1 WEEK        5 - 50     2 WEEKS       50 - 500     3 WEEKS       100 - 10,000     4 WEEKS     1,000 - 30,000     5 WEEKS     3,500 - 115,000   6-8 WEEKS     12,000 - 270,000    12 WEEKS     15,000 - 220,000        FEMALE AND NON-PREGNANT FEMALE:     LESS THAN 5 mIU/mL Performed at [facility], [address] [place], [place] [address]   Lactic acid, plasma     Status: None   Collection Time: 05/07/23  4:31 PM  Result Value Ref Range   Lactic Acid, Venous 1.5 0.5 - 1.9 mmol/L    Comment: Performed at [facility], [address] [place], [place] [address]  Lactic acid, plasma  Status: None   Collection Time: 05/07/23  9:27 PM  Result Value Ref Range   Lactic Acid, Venous 1.1 0.5 - 1.9 mmol/L    Comment: Performed at [facility], [address], [place], [place] [address]   D-dimer, quantitative     Status: None   Collection Time: 05/07/23  9:27 PM  Result Value Ref Range   D-Dimer, Quant 0.39 0.00 - 0.50 ug/mL-FEU    Comment: (NOTE) At the manufacturer cut-off value of 0.5 g/mL FEU, this assay has a negative predictive value of 95-100%.This assay is intended for use in conjunction with a clinical pretest probability (PTP) assessment model to exclude pulmonary embolism (PE) and deep venous thrombosis (DVT) in outpatients suspected of PE or DVT. Results should be correlated with clinical presentation. Performed at [facility], [address] [place], [place] [address]   CBC with Differential/Platelet     Status: Abnormal   Collection Time: 05/08/23  3:58 AM  Result Value Ref Range   WBC 10.2 4.0 - 10.5 K/uL   RBC 4.76 3.87 - 5.11 MIL/uL   Hemoglobin 12.1 12.0 - 15.0 g/dL   HCT 29.5 62.1 - 30.8 %   MCV 82.1 80.0 - 100.0 fL   MCH 25.4 (L) 26.0 - 34.0 pg   MCHC 30.9 30.0 - 36.0 g/dL   RDW 65.7 84.6 - 96.2 %   Platelets 270 150 - 400 K/uL   nRBC 0.0 0.0 - 0.2 %   Neutrophils Relative % 54 %   Neutro Abs 5.6 1.7 - 7.7 K/uL   Lymphocytes Relative 37 %   Lymphs Abs 3.7 0.7 - 4.0 K/uL   Monocytes Relative 7 %   Monocytes Absolute 0.7 0.1 - 1.0 K/uL   Eosinophils Relative 1 %   Eosinophils Absolute 0.1 0.0 - 0.5 K/uL   Basophils Relative 1 %   Basophils Absolute 0.1 0.0 - 0.1 K/uL   Immature Granulocytes 0 %   Abs Immature Granulocytes 0.04 0.00 - 0.07 K/uL    Comment: Performed at [facility], [address], [place], [place] [address]  Comprehensive metabolic panel     Status: Abnormal   Collection Time: 05/08/23  3:58 AM  Result Value Ref Range   Sodium 137 135 - 145 mmol/L   Potassium 2.5 (LL) 3.5 - 5.1 mmol/L    Comment: CRITICAL RESULT CALLED TO, READ BACK BY AND VERIFIED WITH Rivka Barbara RN @ [phone] 05/08/23.GILBERTL    Chloride 116 (H) 98 - 111 mmol/L   CO2 17 (L) 22 - 32 mmol/L   Glucose, Bld 151 (H)  70 - 99 mg/dL    Comment: Glucose reference range applies only to samples taken after fasting for at least 8 hours.   BUN 8 6 - 20 mg/dL   Creatinine, Ser 4.01 0.44 - 1.00 mg/dL   Calcium 6.0 (LL) 8.9 - 10.3 mg/dL    Comment: CRITICAL RESULT CALLED TO, READ BACK BY AND VERIFIED WITH Rivka Barbara RN @ [phone] 05/08/23.GILBERTL    Total Protein 4.9 (L) 6.5 - 8.1 g/dL   Albumin 2.2 (L) 3.5 - 5.0 g/dL   AST 11 (L) 15 - 41 U/L   ALT 11 0 - 44 U/L   Alkaline Phosphatase 60 38 - 126 U/L   Total Bilirubin 0.4 0.3 - 1.2 mg/dL   GFR, Estimated >53 >66 mL/min    Comment: (NOTE) Calculated using the CKD-EPI Creatinine Equation (2021)    Anion gap 4 (L) 5 -  15    Comment: Performed at [facility], [address] [place], [place] [address]  Magnesium     Status: Abnormal   Collection Time: 05/08/23  3:58 AM  Result Value Ref Range   Magnesium 1.3 (L) 1.7 - 2.4 mg/dL    Comment: Performed at [facility], [address], [place], [place] [address]   CT ABDOMEN PELVIS W CONTRAST  Result Date: 05/07/2023 CLINICAL DATA:  Bilateral lower abdominal pain, pelvic pain, fever EXAM: CT ABDOMEN AND PELVIS WITH CONTRAST TECHNIQUE: Multidetector CT imaging of the abdomen and pelvis was performed using the standard protocol following bolus administration of intravenous contrast. RADIATION DOSE REDUCTION: This exam was performed according to the departmental dose-optimization program which includes automated exposure control, adjustment of the mA and/or kV according to patient size and/or use of iterative reconstruction technique. CONTRAST:  OMNIPAQUE IOHEXOL 300 MG/ML  SOLN COMPARISON:  03/04/2023 FINDINGS: Lower chest: No acute pleural or parenchymal lung disease. Hepatobiliary: No focal liver abnormality is seen. Status post cholecystectomy. No biliary dilatation. Pancreas: Unremarkable. No pancreatic ductal dilatation or surrounding inflammatory changes. Spleen: Normal in  size without focal abnormality. Adrenals/Urinary Tract: Adrenal glands are unremarkable. Kidneys are normal, without renal calculi, focal lesion, or hydronephrosis. Bladder is unremarkable. Stomach/Bowel: No bowel obstruction or ileus. Normal appendix right lower quadrant. No bowel wall thickening or inflammatory change. Vascular/Lymphatic: No significant vascular findings are present. No enlarged abdominal or pelvic lymph nodes. Multiple small central mesenteric lymph nodes are again seen, nonspecific. Reproductive: Uterus and bilateral adnexa are unremarkable. Other: No free fluid or free intraperitoneal gas. Small fat containing umbilical hernia. No bowel herniation. Musculoskeletal: No acute or destructive bony abnormalities. Reconstructed images demonstrate no additional findings. IMPRESSION: 1. No acute intra-abdominal or intrapelvic process. 2. Stable nonspecific subcentimeter central mesenteric lymph nodes. No pathologic adenopathy. Electronically Signed   By: Sharlet Salina M.D.   On: 05/07/2023 20:20   DG Chest Portable 1 View  Result Date: 05/07/2023 CLINICAL DATA:  Near syncope EXAM: PORTABLE CHEST 1 VIEW COMPARISON:  None Available. FINDINGS: The heart size and mediastinal contours are within normal limits. Both lungs are clear. The visualized skeletal structures are unremarkable. IMPRESSION: No active disease. Electronically Signed   By: Sharlet Salina M.D.   On: 05/07/2023 18:23    Pending Labs Unresulted Labs (From admission, onward)     Start     Ordered   05/08/23 1100  Renal function panel  Once-Timed,   TIMED        05/08/23 0724   05/07/23 1629  Urinalysis, w/ Reflex to Culture (Infection Suspected) -Urine, Clean Catch  Once,   URGENT       Question:  Specimen Source  Answer:  Urine, Clean Catch   05/07/23 1628            Vitals/Pain Today's Vitals   05/08/23 0658 05/08/23 0700 05/08/23 0729 05/08/23 0930  BP:  (!) 114/58  115/77  Pulse:  97  98  Resp:  16  16  Temp:  98.3 F (36.8 C)     TempSrc: Oral     SpO2:  99%    Weight:      Height:      PainSc:   3      Isolation Precautions No active isolations  Medications Medications  melatonin tablet 3 mg (has no administration in time range)  ondansetron (ZOFRAN) injection 4 mg (has no administration in time range)  acetaminophen (TYLENOL) tablet 650  mg (650 mg Oral Given 05/08/23 0401)    Or  acetaminophen (TYLENOL) suppository 650 mg ( Rectal See Alternative 05/08/23 0401)  naloxone [facility]) injection 0.4 mg (has no administration in time range)  HYDROmorphone (DILAUDID) injection 0.5 mg (0.5 mg Intravenous Given 05/08/23 0658)  magnesium sulfate IVPB 4 g 100 mL (4 g Intravenous New Bag/Given 05/08/23 0846)  lactated ringers bolus 1,000 mL (0 mLs Intravenous Stopped 05/07/23 1843)  HYDROmorphone (DILAUDID) injection 1 mg (1 mg Intravenous Given 05/07/23 1915)  iohexol (OMNIPAQUE) 300 MG/ML solution 100 mL (100 mLs Intravenous Contrast Given 05/07/23 2005)  HYDROmorphone (DILAUDID) injection 1 mg (1 mg Intravenous Given 05/08/23 0049)  potassium chloride SA (KLOR-CON M) CR tablet 40 mEq (40 mEq Oral Given 05/08/23 0222)  potassium chloride SA (KLOR-CON M) CR tablet 40 mEq (40 mEq Oral Given 05/08/23 0450)    Mobility walks     Focused Assessments N/A   R Recommendations: See Admitting Provider Note  Report given to:   Additional Notes: N/A

## 2023-05-08 NOTE — Progress Notes (Signed)
  Echocardiogram 2D Echocardiogram has been performed.  Milda Smart 05/08/2023, 2:55 PM

## 2023-05-08 NOTE — Progress Notes (Signed)
Carotid artery duplex has been completed. Preliminary results can be found in CV Proc through chart review.   05/08/23 1:53 PM Olen Cordial RVT

## 2023-05-08 NOTE — ED Notes (Signed)
Pt. Stated she could not provide urine specimen at this time.

## 2023-05-08 NOTE — H&P (Signed)
History and Physical    Patient: Jade Lopez VHQ:[id number] DOB: Dec 19, 1995 DOA: 05/07/2023 DOS: the patient was seen and examined on 05/08/2023 PCP: Manuela Neptune, MD  Patient coming from: Home  Chief Complaint:  Chief Complaint  Patient presents with   Near Syncope   HPI: Jade Lopez is a 27 y.o. female with medical history significant of asthma, class III obesity, endometriosis, fibromyalgia, sleep apnea, sleep-related headaches, superobesity, type 2 diabetes, tubo-ovarian abscess who presented to the emergency department complaints of and exacerbation of chronic pelvic pain that she has been having for several months.  Patient also stated she has been having fevers at home and took 1000 mg of acetaminophen prior to arrival.  She has been afebrile since arriving to the hospital.  She denied prodromal symptoms or recent chest pain, palpitations, diaphoresis, PND, orthopnea, but stated she occasionally has pitting edema of the lower extremities.  She has been nauseous, has had diarrhea, but no emesis, constipation, melena or hematochezia.  She has been having URI symptoms since earlier this week and has been UC her butyryl inhaler several times daily for the past week.  She stated that several coworkers have URI and GI symptoms.  No flank pain, dysuria, frequency or hematuria.  No polyuria, polydipsia, polyphagia or blurred vision.   Lab work: CBC showed white count 10.3, hemoglobin 14.5 g deciliter platelets 330.  Lactic acid x 2 was normal.  Magnesium was 1.3 mg/dL.  Serum hCG was normal.  CMP showed a glucose of 143 mg/dL.  Total protein 8.2 g/dL and AST of 13 units/L.  The rest of the CMP measurements were normal.  Imaging: Portable 1 view chest radiograph with no active disease.  CT abdomen/pelvis with contrast with no acute intra-abdominal or intrapelvic process.  Stable nonspecific subcentimeter central mesenteric lymph nodes.  No pathologic adenopathy.   ED course:  Initial vital signs were temperature 98.7 F, pulse 109, respiration 18, BP 111/79 O2 sat 100% on room air.  The patient received 40 mEq of potassium chloride x 2 LR 1000 mL bolus hydromorphone 1 mg IVP x 1 and I added calcium gluconate 2 g IVPB, magnesium sulfate 4 g IVPB and pantoprazole 40 mg IVP.  Review of Systems: As mentioned in the history of present illness. All other systems reviewed and are negative. Past Medical History:  Diagnosis Date   Asthma    Autoimmune disease (HCC)    Blood clots in brain 2020   Class 3 obesity (HCC)    Endometriosis    Endometritis    Fibromyalgia    Sleep apnea    Sleep related headaches 02/24/2023   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   T2DM (type 2 diabetes mellitus) (HCC)    TOA (tubo-ovarian abscess)    Past Surgical History:  Procedure Laterality Date   CHOLECYSTECTOMY     DILATATION AND CURETTAGE/HYSTEROSCOPY WITH MINERVA     3 total   TONSILLECTOMY     TONSILLECTOMY AND ADENOIDECTOMY     Social History:  reports that she has never smoked. She has never used smokeless tobacco. She reports current alcohol use. She reports that she does not use drugs.  Allergies  Allergen Reactions   Clindamycin/Lincomycin Other (See Comments), Hives, Palpitations, Rash and Swelling    Other Reaction(s): Racing heart beat   Gabapentin Hives, Nausea And Vomiting and Palpitations    Other Reaction(s): Unknown   Other Anaphylaxis    Allergic to Rynatan cough syrup.   Chlorpheniramine-Phenylephrine Nausea And Vomiting   Pregabalin Rash  Allergic to the generic     Family History  Problem Relation Age of Onset   Diabetes Mother    Hypertension Mother    Diabetes Father    Hypertension Father     Prior to Admission medications   Medication Sig Start Date End Date Taking? Authorizing Provider  albuterol (VENTOLIN HFA) 108 (90 Base) MCG/ACT inhaler Inhale into the lungs every 6 (six) hours as needed for wheezing or shortness of breath.   Yes [provider]  busPIRone (BUSPAR) 10 MG tablet Take 3 tablets (30 mg total) by mouth daily. 11/25/22  Yes Marolyn Haller, MD  medroxyPROGESTERone (DEPO-PROVERA) 150 MG/ML injection Inject 150 mg into the muscle every 3 (three) months.   Yes [provider]  norethindrone (AYGESTIN) 5 MG tablet Take 10 mg by mouth every evening. 12/11/22  Yes [provider]  pregabalin (LYRICA) 50 MG capsule Take 1 capsule (50 mg total) by mouth 3 (three) times daily. 01/12/23  Yes Marolyn Haller, MD  Accu-Chek Softclix Lancets lancets daily. 12/15/22   [provider]  albuterol (ACCUNEB) 1.25 MG/3ML nebulizer solution Take 1 ampule by nebulization every 6 (six) hours as needed for wheezing.    [provider]  Blood Glucose Monitoring Suppl (MM EASY TOUCH GLUCOSE METER) w/Device KIT Please check your blood sugar daily, in the morning. 12/08/22   Marolyn Haller, MD  cyclobenzaprine (FLEXERIL) 10 MG tablet Take 1 tablet (10 mg total) by mouth 3 (three) times daily as needed for muscle spasms. Patient not taking: Reported on 05/08/2023 03/09/23   Steffanie Rainwater, MD  oxyCODONE (ROXICODONE) 5 MG immediate release tablet Take 1 tablet (5 mg total) by mouth every 4 (four) hours as needed for severe pain. Patient not taking: Reported on 05/08/2023 04/17/23   Rondel Baton, MD  Semaglutide,0.25 or 0.5MG /DOS, 2 MG/3ML SOPN Inject 0.25 mg weekly for 4 weeks then 0.5 mg weekly Patient not taking: Reported on 03/15/2023 03/09/23   Steffanie Rainwater, MD  Vitamin D, Ergocalciferol, (DRISDOL) 1.25 MG (50000 UNIT) CAPS capsule TAKE 1 CAPSULE BY MOUTH EVERY 7 DAYS Patient not taking: Reported on 03/11/2023 02/19/23   Marolyn Haller, MD    Physical Exam: Vitals:   05/08/23 0349 05/08/23 0630 05/08/23 0658 05/08/23 0700  BP: 106/66 129/66  (!) 114/58  Pulse: 97 (!) 101  97  Resp: 18 16  16   Temp:   98.3 F (36.8 C)   TempSrc:   Oral   SpO2: 97% 98%  99%  Weight:      Height:        Physical Exam Vitals and nursing note reviewed.  Constitutional:      General: She is awake. She is not in acute distress.    Appearance: Normal appearance. She is morbidly obese.  HENT:     Head: Normocephalic.     Nose: No rhinorrhea.     Mouth/Throat:     Mouth: Mucous membranes are dry.  Eyes:     General: No scleral icterus.    Pupils: Pupils are equal, round, and reactive to light.  Neck:     Vascular: No JVD.  Cardiovascular:     Rate and Rhythm: Normal rate and regular rhythm.     Heart sounds: S1 normal and S2 normal.  Pulmonary:     Effort: Pulmonary effort is normal.     Breath sounds: Normal breath sounds. No wheezing or rales.  Abdominal:     General: There is distension.  Palpations: Abdomen is soft.     Tenderness: There is abdominal tenderness. There is no guarding or rebound.  Musculoskeletal:     Cervical back: Neck supple.     Right lower leg: No edema.     Left lower leg: No edema.  Skin:    General: Skin is warm and dry.  Neurological:     General: No focal deficit present.     Mental Status: She is alert and oriented to person, place, and time.  Psychiatric:        Mood and Affect: Mood normal.        Behavior: Behavior normal. Behavior is cooperative.     Data Reviewed:  Results are pending, will review when available. EKG: Vent. rate 101 BPM PR interval 133 ms QRS duration 83 ms QT/QTcB 325/422 ms P-R-T axes 63 52 57 Sinus tachycardia No significant change since previous ECG.  Transthoracic echocardiogram done earlier today. Sonographer Comments: Patient is obese. Image acquisition challenging due  to patient body habitus and Image acquisition challenging due to  respiratory motion.   IMPRESSIONS:   1. Left ventricular ejection fraction, by estimation, is 60 to 65%. The  left ventricle has normal function. The left ventricle has no regional  wall motion abnormalities. Left ventricular diastolic parameters were  normal.   2.  Right ventricular systolic function is normal. The right ventricular  size is normal. Tricuspid regurgitation signal is inadequate for assessing  PA pressure.   3. The mitral valve is grossly normal. Trivial mitral valve  regurgitation. No evidence of mitral stenosis.   4. The aortic valve is tricuspid. Aortic valve regurgitation is not  visualized. No aortic stenosis is present.   5. The inferior vena cava is normal in size with greater than 50%  respiratory variability, suggesting right atrial pressure of 3 mmHg.   Assessment and Plan: Principal Problem:   Syncope Observation/telemetry. Correct electrolyte abnormality. Check carotid Doppler. Check echocardiogram.  Active Problems:   Asthma exacerbation In the setting of:   URI (upper respiratory infection) Observation/telemetry Continue supplemental oxygen. Methylprednisolone 125 mg IVP x1. Followed by nebulized budesonide. Scheduled and as needed bronchodilators. Follow-up CBC and chemistry in the morning.     OSA on CPAP Continue CPAP at bedtime.    Type 2 diabetes mellitus (HCC) Carbohydrate modified diet. Check hemoglobin A1c. CBG monitoring with RI SS while in the hospital.    Class 3 obesity (HCC) Current BMI 64.22 kg/m. Very high risk for cardiovascular complications. Follow-up with PCP and/or bariatric clinic.    Abdominal wall and pelvic pain Continue Aygestin 10 mg in the evening. Continue analgesics as needed. Follow-up with GYN as scheduled.      Advance Care Planning:   Code Status: Full Code   Consults:   Family Communication:   Severity of Illness: The appropriate patient status for this patient is OBSERVATION. Observation status is judged to be reasonable and necessary in order to provide the required intensity of service to ensure the patient's safety. The patient's presenting symptoms, physical exam findings, and initial radiographic and laboratory data in the context of their medical  condition is felt to place them at decreased risk for further clinical deterioration. Furthermore, it is anticipated that the patient will be medically stable for discharge from the hospital within 2 midnights of admission.   Author: Bobette Mo, MD 05/08/2023 7:22 AM  For on call review [email or web address].   This document was prepared using Conservation officer, historic buildings and may contain some  unintended transcription errors.

## 2023-05-08 NOTE — Progress Notes (Signed)
  Carryover admission to the Day Admitter.  I discussed this case with the EDP, Maryanna Shape, PA.  Per these discussions:   This is a 27 year old female who is being admitted with syncope.  She has been experiencing multiple presyncopal episodes over the last few days, but without any formal loss of consciousness or that time.  However, in the emergency department this evening, she has had 2 witnessed syncopal episodes, one when she was rising from a seated to standing position and the other occurred while she was ambulating in her room.  Not associate with any tonic-clonic activity or tongue biting.  Not associate any acute focal neurologic deficits.  Her D-dimer is not elevated.  She had negative orthostatic vital signs in the ED today.  No known history of arrhythmia or accessory electrical pathway from a cardiac standpoint.   However, in the setting of progressive presyncope now with multiple unexplained syncopal episodes in this patient that lives by herself at home, she has been admitted for overnight observation for further evaluation management of her syncopal episodes, including further monitoring on telemetry, echocardiogram in the morning, which I ordered, and consideration for heart monitor with event recorder.  Her presenting labs are notable for potassium level 3.8.  I have placed an order for observation to med/tele for further evaluation management of the above.  I have placed some additional preliminary admit orders via the adult multi-morbid admission order set. I have also ordered potassium chloride 40 mill equivalents p.o. x 1 dose now.  I also ordered morning labs in the form of CMP, CBC, and serum magnesium level.  Fall precautions ordered.    Newton Pigg, DO Hospitalist

## 2023-05-09 DIAGNOSIS — Z91199 Patient's noncompliance with other medical treatment and regimen due to unspecified reason: Secondary | ICD-10-CM | POA: Diagnosis not present

## 2023-05-09 DIAGNOSIS — R55 Syncope and collapse: Secondary | ICD-10-CM | POA: Diagnosis not present

## 2023-05-09 DIAGNOSIS — Z79899 Other long term (current) drug therapy: Secondary | ICD-10-CM | POA: Diagnosis not present

## 2023-05-09 DIAGNOSIS — Z713 Dietary counseling and surveillance: Secondary | ICD-10-CM | POA: Diagnosis not present

## 2023-05-09 DIAGNOSIS — Z8249 Family history of ischemic heart disease and other diseases of the circulatory system: Secondary | ICD-10-CM | POA: Diagnosis not present

## 2023-05-09 DIAGNOSIS — R102 Pelvic and perineal pain: Secondary | ICD-10-CM | POA: Diagnosis present

## 2023-05-09 DIAGNOSIS — G4733 Obstructive sleep apnea (adult) (pediatric): Secondary | ICD-10-CM | POA: Diagnosis present

## 2023-05-09 DIAGNOSIS — R1084 Generalized abdominal pain: Secondary | ICD-10-CM

## 2023-05-09 DIAGNOSIS — E1165 Type 2 diabetes mellitus with hyperglycemia: Secondary | ICD-10-CM | POA: Diagnosis present

## 2023-05-09 DIAGNOSIS — R197 Diarrhea, unspecified: Secondary | ICD-10-CM | POA: Diagnosis present

## 2023-05-09 DIAGNOSIS — J069 Acute upper respiratory infection, unspecified: Secondary | ICD-10-CM | POA: Diagnosis present

## 2023-05-09 DIAGNOSIS — E860 Dehydration: Secondary | ICD-10-CM | POA: Diagnosis present

## 2023-05-09 DIAGNOSIS — Z888 Allergy status to other drugs, medicaments and biological substances status: Secondary | ICD-10-CM | POA: Diagnosis not present

## 2023-05-09 DIAGNOSIS — Z833 Family history of diabetes mellitus: Secondary | ICD-10-CM | POA: Diagnosis not present

## 2023-05-09 DIAGNOSIS — Z6841 Body Mass Index (BMI) 40.0 and over, adult: Secondary | ICD-10-CM | POA: Diagnosis not present

## 2023-05-09 DIAGNOSIS — Z881 Allergy status to other antibiotic agents status: Secondary | ICD-10-CM | POA: Diagnosis not present

## 2023-05-09 DIAGNOSIS — Z794 Long term (current) use of insulin: Secondary | ICD-10-CM | POA: Diagnosis not present

## 2023-05-09 DIAGNOSIS — R000 Tachycardia, unspecified: Secondary | ICD-10-CM | POA: Diagnosis present

## 2023-05-09 DIAGNOSIS — Z9049 Acquired absence of other specified parts of digestive tract: Secondary | ICD-10-CM | POA: Diagnosis not present

## 2023-05-09 DIAGNOSIS — M797 Fibromyalgia: Secondary | ICD-10-CM | POA: Diagnosis present

## 2023-05-09 DIAGNOSIS — N809 Endometriosis, unspecified: Secondary | ICD-10-CM | POA: Diagnosis present

## 2023-05-09 DIAGNOSIS — G8929 Other chronic pain: Secondary | ICD-10-CM | POA: Diagnosis present

## 2023-05-09 HISTORY — DX: Syncope and collapse: R55

## 2023-05-09 LAB — GLUCOSE, CAPILLARY
Glucose-Capillary: 259 mg/dL — ABNORMAL HIGH (ref 70–99)
Glucose-Capillary: 270 mg/dL — ABNORMAL HIGH (ref 70–99)
Glucose-Capillary: 207 mg/dL — ABNORMAL HIGH (ref 70–99)
Glucose-Capillary: 202 mg/dL — ABNORMAL HIGH (ref 70–99)
Glucose-Capillary: 236 mg/dL — ABNORMAL HIGH (ref 70–99)

## 2023-05-09 LAB — COMPREHENSIVE METABOLIC PANEL
ALT: 16 U/L (ref 0–44)
AST: 11 U/L — ABNORMAL LOW (ref 15–41)
Albumin: 3.3 g/dL — ABNORMAL LOW (ref 3.5–5.0)
Alkaline Phosphatase: 92 U/L (ref 38–126)
Anion gap: 10 (ref 5–15)
BUN: 10 mg/dL (ref 6–20)
CO2: 20 mmol/L — ABNORMAL LOW (ref 22–32)
Calcium: 8.5 mg/dL — ABNORMAL LOW (ref 8.9–10.3)
Chloride: 103 mmol/L (ref 98–111)
Creatinine, Ser: 0.69 mg/dL (ref 0.44–1.00)
GFR, Estimated: >60 mL/min (ref >60)
Glucose, Bld: 268 mg/dL — ABNORMAL HIGH (ref 70–99)
Potassium: 3.9 mmol/L (ref 3.5–5.1)
Sodium: 133 mmol/L — ABNORMAL LOW (ref 135–145)
Total Bilirubin: 0.4 mg/dL (ref 0.3–1.2)
Total Protein: 7.1 g/dL (ref 6.5–8.1)

## 2023-05-09 LAB — CBC WITH DIFFERENTIAL/PLATELET
Abs Immature Granulocytes: 0.11 10*3/uL — ABNORMAL HIGH (ref 0.00–0.07)
Basophils Absolute: 0 10*3/uL (ref 0.0–0.1)
Basophils Relative: 0 %
Eosinophils Absolute: 0 10*3/uL (ref 0.0–0.5)
Eosinophils Relative: 0 %
HCT: 43.1 % (ref 36.0–46.0)
Hemoglobin: 13.1 g/dL (ref 12.0–15.0)
Immature Granulocytes: 1 %
Lymphocytes Relative: 12 %
Lymphs Abs: 1.7 10*3/uL (ref 0.7–4.0)
MCH: 25.2 pg — ABNORMAL LOW (ref 26.0–34.0)
MCHC: 30.4 g/dL (ref 30.0–36.0)
MCV: 82.9 fL (ref 80.0–100.0)
Monocytes Absolute: 0.7 10*3/uL (ref 0.1–1.0)
Monocytes Relative: 5 %
Neutro Abs: 11.9 10*3/uL — ABNORMAL HIGH (ref 1.7–7.7)
Neutrophils Relative %: 82 %
Platelets: 341 10*3/uL (ref 150–400)
RBC: 5.2 MIL/uL — ABNORMAL HIGH (ref 3.87–5.11)
RDW: 14.8 % (ref 11.5–15.5)
WBC: 14.4 10*3/uL — ABNORMAL HIGH (ref 4.0–10.5)
nRBC: 0 % (ref 0.0–0.2)

## 2023-05-09 MED ORDER — HYDROCODONE-ACETAMINOPHEN 5-325 MG PO TABS: 1.0000 | ORAL_TABLET | ORAL | Status: DC | PRN

## 2023-05-09 MED ORDER — INSULIN DETEMIR 100 UNIT/ML ~~LOC~~ SOLN
5.0000 [IU] | Freq: Two times a day (BID) | SUBCUTANEOUS | Status: DC
  Administered 2023-05-09: 5 [IU] via SUBCUTANEOUS
  Filled 2023-05-09 (×2): qty 0.05

## 2023-05-09 MED ORDER — MAGNESIUM OXIDE -MG SUPPLEMENT 400 (240 MG) MG PO TABS
400.0000 mg | ORAL_TABLET | Freq: Two times a day (BID) | ORAL | Status: AC
  Administered 2023-05-09 (×2): 400 mg via ORAL
  Filled 2023-05-09 (×2): qty 1

## 2023-05-09 MED ORDER — INSULIN DETEMIR 100 UNIT/ML ~~LOC~~ SOLN
10.0000 [IU] | Freq: Once | SUBCUTANEOUS | Status: AC
  Administered 2023-05-09: 10 [IU] via SUBCUTANEOUS
  Filled 2023-05-09: qty 0.1

## 2023-05-09 MED ORDER — INSULIN ASPART 100 UNIT/ML IJ SOLN
4.0000 [IU] | Freq: Three times a day (TID) | INTRAMUSCULAR | Status: DC
  Administered 2023-05-09 (×3): 4 [IU] via SUBCUTANEOUS

## 2023-05-09 MED ORDER — INSULIN ASPART 100 UNIT/ML IJ SOLN
0.0000 [IU] | Freq: Every day | INTRAMUSCULAR | Status: DC
  Administered 2023-05-09: 2 [IU] via SUBCUTANEOUS

## 2023-05-09 MED ORDER — INSULIN DETEMIR 100 UNIT/ML ~~LOC~~ SOLN
10.0000 [IU] | Freq: Two times a day (BID) | SUBCUTANEOUS | Status: DC
  Filled 2023-05-09: qty 0.1

## 2023-05-09 MED ORDER — INSULIN ASPART 100 UNIT/ML IJ SOLN
0.0000 [IU] | Freq: Three times a day (TID) | INTRAMUSCULAR | Status: DC
  Administered 2023-05-09 (×2): 8 [IU] via SUBCUTANEOUS
  Administered 2023-05-09: 5 [IU] via SUBCUTANEOUS
  Administered 2023-05-10 (×2): 2 [IU] via SUBCUTANEOUS

## 2023-05-09 MED ORDER — TRAMADOL HCL 50 MG PO TABS
50.0000 mg | ORAL_TABLET | Freq: Once | ORAL | Status: AC
  Administered 2023-05-09: 50 mg via ORAL
  Filled 2023-05-09: qty 1

## 2023-05-09 MED ORDER — KETOROLAC TROMETHAMINE 30 MG/ML IJ SOLN
30.0000 mg | Freq: Four times a day (QID) | INTRAMUSCULAR | Status: DC | PRN
  Administered 2023-05-09 – 2023-05-10 (×2): 30 mg via INTRAVENOUS
  Filled 2023-05-09 (×2): qty 1

## 2023-05-09 MED ORDER — INSULIN DETEMIR 100 UNIT/ML ~~LOC~~ SOLN
20.0000 [IU] | Freq: Two times a day (BID) | SUBCUTANEOUS | Status: DC
  Administered 2023-05-09: 20 [IU] via SUBCUTANEOUS
  Filled 2023-05-09 (×2): qty 0.2

## 2023-05-09 MED ORDER — MORPHINE SULFATE (PF) 4 MG/ML IV SOLN
4.0000 mg | INTRAVENOUS | Status: DC | PRN
  Administered 2023-05-09 (×2): 4 mg via INTRAVENOUS
  Filled 2023-05-09 (×2): qty 1

## 2023-05-09 NOTE — Progress Notes (Signed)
[facility] PROGRESS NOTE    Progress Note  Jade Lopez  ZOX:[id number] DOB: 13-Apr-1996 DOA: 05/07/2023 PCP: Manuela Neptune, MD     Brief Narrative:   Jade Lopez is an 27 y.o. female past medical history significant for asthma, obesity with obstructive sleep apnea diabetes mellitus type 2 tubo-ovarian abscess comes in complaining of exacerbation of her pelvic pain for the last several months, subjective fevers, she also relates upper respiratory tract infection and GI symptoms, white count of 10 hemoglobin of 14, mag 1.3 beta-hCG was negative, chest x-ray showed no acute findings, CT scan of the abdomen pelvis with contrast showed no acute intra-abdominal or pelvic processes stable nonspecific submesentery mesenteric lymph node   Assessment/Plan:   Near syncope; Events on telemetry, 2D echo showed an EF of 60% no diastolic dysfunction right ventricular function is unremarkable valves are unremarkable Discontinue telemetry  Upper respiratory infection/questionable asthma exacerbation: She was given IV steroids in the ED this was discontinued she is at 90% on room air she just has a runny nose and some nasal congestion.  Struct of sleep apnea: Continue CPAP at night.  Diabetes mellitus type 2: Blood glucose significantly elevated. Started on long-acting insulin continue sliding scale. Check an A1c.  She was started on steroids which will make her blood glucose erratic.  Morbid obesity: With a BMI of 70 she has been counseled.  Abdominal wall/pelvic pain: Continue current medication follow-up with GYN as an outpatient. CT scan of the abdomen pelvis showed no acute findings. She has a history of endometriosis, she relates yesterday.  Probably her pain is likely due to her endometriosis    DVT prophylaxis: lovenox Family Communication:mother Status is: Observation The patient remains OBS appropriate and will d/c before 2 midnights.    Code Status:      Code Status Orders  (From admission, onward)           Start     Ordered   05/08/23 0047  Full code  Continuous       Question:  By:  Answer:  Consent: discussion documented in EHR   05/08/23 0046           Code Status History     This patient has a current code status but no historical code status.         IV Access:   Peripheral IV   Procedures and diagnostic studies:   ECHOCARDIOGRAM COMPLETE  Result Date: 05/08/2023    ECHOCARDIOGRAM REPORT   Patient Name:   [name] Date of Exam: 05/08/2023 Medical Rec #:  [id number]      Height:       64.5 in Accession #:    [id number]     Weight:       380.0 lb Date of Birth:  1996-10-07      BSA:          2.584 m Patient Age:    26 years       BP:           126/51 mmHg Patient Gender: F              HR:           90 bpm. Exam Location:  Inpatient Procedure: 2D Echo, Cardiac Doppler and Color Doppler Indications:    Syncope  History:        Patient has no prior history of Echocardiogram examinations.  Risk Factors:Diabetes, Obesity and Sleep Apnea. Lupus.  Sonographer:    Milda Smart Referring Phys: [id number] Angie Fava  Sonographer Comments: Patient is obese. Image acquisition challenging due to patient body habitus and Image acquisition challenging due to respiratory motion. IMPRESSIONS  1. Left ventricular ejection fraction, by estimation, is 60 to 65%. The left ventricle has normal function. The left ventricle has no regional wall motion abnormalities. Left ventricular diastolic parameters were normal.  2. Right ventricular systolic function is normal. The right ventricular size is normal. Tricuspid regurgitation signal is inadequate for assessing PA pressure.  3. The mitral valve is grossly normal. Trivial mitral valve regurgitation. No evidence of mitral stenosis.  4. The aortic valve is tricuspid. Aortic valve regurgitation is not visualized. No aortic stenosis is present.  5. The inferior vena cava  is normal in size with greater than 50% respiratory variability, suggesting right atrial pressure of 3 mmHg. Conclusion(s)/Recommendation(s): Normal biventricular function without evidence of hemodynamically significant valvular heart disease. FINDINGS  Left Ventricle: Left ventricular ejection fraction, by estimation, is 60 to 65%. The left ventricle has normal function. The left ventricle has no regional wall motion abnormalities. The left ventricular internal cavity size was normal in size. There is  no left ventricular hypertrophy. Left ventricular diastolic parameters were normal. Right Ventricle: The right ventricular size is normal. No increase in right ventricular wall thickness. Right ventricular systolic function is normal. Tricuspid regurgitation signal is inadequate for assessing PA pressure. Left Atrium: Left atrial size was normal in size. Right Atrium: Right atrial size was normal in size. Pericardium: There is no evidence of pericardial effusion. Mitral Valve: The mitral valve is grossly normal. Trivial mitral valve regurgitation. No evidence of mitral valve stenosis. Tricuspid Valve: The tricuspid valve is grossly normal. Tricuspid valve regurgitation is not demonstrated. No evidence of tricuspid stenosis. Aortic Valve: The aortic valve is tricuspid. Aortic valve regurgitation is not visualized. No aortic stenosis is present. Pulmonic Valve: The pulmonic valve was grossly normal. Pulmonic valve regurgitation is not visualized. No evidence of pulmonic stenosis. Aorta: The aortic root and ascending aorta are structurally normal, with no evidence of dilitation. Venous: The inferior vena cava is normal in size with greater than 50% respiratory variability, suggesting right atrial pressure of 3 mmHg. IAS/Shunts: The atrial septum is grossly normal.  LEFT VENTRICLE PLAX 2D LVIDd:         3.80 cm     Diastology LVIDs:         3.00 cm     LV e' medial:  9.14 cm/s LV PW:         0.90 cm     LV e' lateral:  14.00 cm/s LV IVS:        0.90 cm LVOT diam:     2.20 cm LV SV:         85 LV SV Index:   33 LVOT Area:     3.80 cm  LV Volumes (MOD) LV vol d, MOD A2C: 93.9 ml LV vol d, MOD A4C: 97.1 ml LV vol s, MOD A2C: 42.0 ml LV vol s, MOD A4C: 47.8 ml LV SV MOD A2C:     51.9 ml LV SV MOD A4C:     97.1 ml LV SV MOD BP:      53.3 ml RIGHT VENTRICLE             IVC RV Basal diam:  3.30 cm     IVC diam: 1.20 cm RV S prime:  11.40 cm/s TAPSE (M-mode): 2.0 cm LEFT ATRIUM             Index        RIGHT ATRIUM           Index LA diam:        3.30 cm 1.28 cm/m   RA Area:     11.90 cm LA Vol (A2C):   40.7 ml 15.75 ml/m  RA Volume:   26.10 ml  10.10 ml/m LA Vol (A4C):   37.1 ml 14.36 ml/m LA Biplane Vol: 39.0 ml 15.09 ml/m  AORTIC VALVE LVOT Vmax:   118.00 cm/s LVOT Vmean:  85.600 cm/s LVOT VTI:    0.223 m  AORTA Ao Root diam: 3.00 cm Ao Asc diam:  2.60 cm  SHUNTS Systemic VTI:  0.22 m Systemic Diam: 2.20 cm Lennie Odor MD Electronically signed by Lennie Odor MD Signature Date/Time: 05/08/2023/3:04:10 PM    Final    VAS US CAROTID  Result Date: 05/08/2023 Carotid Arterial Duplex Study Patient Name:  ZHANA BOGLE  Date of Exam:   05/08/2023 Medical Rec #: [id number]       Accession #:    [id number] Date of Birth: [birth year]       Patient Gender: F Patient Age:   12 years Exam Location:  [facility] - Inpatient Procedure:      VAS US CAROTID Referring Phys: DAVID ORTIZ --------------------------------------------------------------------------------  Indications:       Syncope. Risk Factors:      Diabetes. Limitations        Today's exam was limited due to the body habitus of the                    patient and the patient's respiratory variation. Comparison Study:  No prior studies. Performing Technologist: Chanda Busing RVT  Examination Guidelines: A complete evaluation includes B-mode imaging, spectral Doppler, color Doppler, and power Doppler as needed of all accessible portions of each vessel. Bilateral testing is  considered an integral part of a complete examination. Limited examinations for reoccurring indications may be performed as noted.  Right Carotid Findings: +----------+--------+--------+--------+-----------------------+--------+           PSV cm/sEDV cm/sStenosisPlaque Description     Comments +----------+--------+--------+--------+-----------------------+--------+ CCA Prox  98      17                                              +----------+--------+--------+--------+-----------------------+--------+ CCA Distal98      30                                              +----------+--------+--------+--------+-----------------------+--------+ ICA Prox  107     41              smooth and heterogenous         +----------+--------+--------+--------+-----------------------+--------+ ICA Mid   96      40                                              +----------+--------+--------+--------+-----------------------+--------+ ICA Distal97      54                                              +----------+--------+--------+--------+-----------------------+--------+  ECA       81      19                                              +----------+--------+--------+--------+-----------------------+--------+ +----------+--------+-------+--------+-------------------+           PSV cm/sEDV cmsDescribeArm Pressure (mmHG) +----------+--------+-------+--------+-------------------+ [id number]                                        +----------+--------+-------+--------+-------------------+ +---------+--------+--+--------+--+---------+ VertebralPSV cm/s55EDV cm/s24Antegrade +---------+--------+--+--------+--+---------+  Left Carotid Findings: +----------+--------+--------+--------+-----------------------+--------+           PSV cm/sEDV cm/sStenosisPlaque Description     Comments +----------+--------+--------+--------+-----------------------+--------+ CCA Prox  142      29              smooth and heterogenous         +----------+--------+--------+--------+-----------------------+--------+ CCA Distal95      27              smooth and heterogenous         +----------+--------+--------+--------+-----------------------+--------+ ICA Prox  86      40              smooth and heterogenous         +----------+--------+--------+--------+-----------------------+--------+ ICA Mid   102     46                                              +----------+--------+--------+--------+-----------------------+--------+ ICA Distal100     46                                              +----------+--------+--------+--------+-----------------------+--------+ ECA       68      4                                               +----------+--------+--------+--------+-----------------------+--------+ +----------+--------+--------+--------+-------------------+           PSV cm/sEDV cm/sDescribeArm Pressure (mmHG) +----------+--------+--------+--------+-------------------+ [id number]                                         +----------+--------+--------+--------+-------------------+ +---------+--------+--+--------+--+---------+ VertebralPSV cm/s57EDV cm/s20Antegrade +---------+--------+--+--------+--+---------+   Summary: Right Carotid: Velocities in the right ICA are consistent with a 1-39% stenosis. Left Carotid: Velocities in the left ICA are consistent with a 1-39% stenosis. Vertebrals: Bilateral vertebral arteries demonstrate antegrade flow. *See table(s) above for measurements and observations.     Preliminary    CT ABDOMEN PELVIS W CONTRAST  Result Date: 05/07/2023 CLINICAL DATA:  Bilateral lower abdominal pain, pelvic pain, fever EXAM: CT ABDOMEN AND PELVIS WITH CONTRAST TECHNIQUE: Multidetector CT imaging of the abdomen and pelvis was performed using the standard protocol following bolus administration of intravenous contrast. RADIATION DOSE  REDUCTION: This exam was performed according to the departmental dose-optimization program which includes automated  exposure control, adjustment of the mA and/or kV according to patient size and/or use of iterative reconstruction technique. CONTRAST:  OMNIPAQUE IOHEXOL 300 MG/ML  SOLN COMPARISON:  03/04/2023 FINDINGS: Lower chest: No acute pleural or parenchymal lung disease. Hepatobiliary: No focal liver abnormality is seen. Status post cholecystectomy. No biliary dilatation. Pancreas: Unremarkable. No pancreatic ductal dilatation or surrounding inflammatory changes. Spleen: Normal in size without focal abnormality. Adrenals/Urinary Tract: Adrenal glands are unremarkable. Kidneys are normal, without renal calculi, focal lesion, or hydronephrosis. Bladder is unremarkable. Stomach/Bowel: No bowel obstruction or ileus. Normal appendix right lower quadrant. No bowel wall thickening or inflammatory change. Vascular/Lymphatic: No significant vascular findings are present. No enlarged abdominal or pelvic lymph nodes. Multiple small central mesenteric lymph nodes are again seen, nonspecific. Reproductive: Uterus and bilateral adnexa are unremarkable. Other: No free fluid or free intraperitoneal gas. Small fat containing umbilical hernia. No bowel herniation. Musculoskeletal: No acute or destructive bony abnormalities. Reconstructed images demonstrate no additional findings. IMPRESSION: 1. No acute intra-abdominal or intrapelvic process. 2. Stable nonspecific subcentimeter central mesenteric lymph nodes. No pathologic adenopathy. Electronically Signed   By: Sharlet Salina M.D.   On: 05/07/2023 20:20   DG Chest Portable 1 View  Result Date: 05/07/2023 CLINICAL DATA:  Near syncope EXAM: PORTABLE CHEST 1 VIEW COMPARISON:  None Available. FINDINGS: The heart size and mediastinal contours are within normal limits. Both lungs are clear. The visualized skeletal structures are unremarkable. IMPRESSION: No active disease.  Electronically Signed   By: Sharlet Salina M.D.   On: 05/07/2023 18:23     Medical Consultants:   None.   Subjective:    Jade Lopez she relates she still has mild pain hungry.  Objective:    Vitals:   05/08/23 2300 05/08/23 2301 05/09/23 0500 05/09/23 0615  BP:  129/70  (!) 147/90  Pulse:  (!) 109  (!) 108  Resp: 17 20  19   Temp:  (!) 97.4 F (36.3 C)  97.9 F (36.6 C)  TempSrc:  Oral  Oral  SpO2:  97%  93%  Weight:   (!) 184.7 kg   Height:       SpO2: 93 % FiO2 (%): 21 %   Intake/Output Summary (Last 24 hours) at 05/09/2023 0711 Last data filed at 05/09/2023 0650 Gross per 24 hour  Intake 1142.31 ml  Output 1300 ml  Net -157.69 ml   Filed Weights   05/07/23 1517 05/09/23 0500  Weight: (!) 172.4 kg (!) 184.7 kg    Exam: General exam: In no acute distress. Respiratory system: Good air movement and clear to auscultation. Cardiovascular system: S1 & S2 heard, RRR. No JVD. Gastrointestinal system: Abdomen is nondistended, soft and nontender.  Extremities: No pedal edema. Skin: No rashes, lesions or ulcers Psychiatry: Judgement and insight appear normal. Mood & affect appropriate.    Data Reviewed:    Labs: Basic Metabolic Panel: Recent Labs  Lab 05/07/23 1631 05/08/23 0358 05/08/23 1500 05/08/23 1501  NA 137 137 134* 135  K 3.8 2.5* 4.9 5.0  CL 105 116* 105 105  CO2 23 17* 19* 20*  GLUCOSE 143* 151* 207* 208*  BUN 11 8 9 9   CREATININE 0.77 0.55 0.79 0.74  CALCIUM 9.1 6.0* 9.3 9.4  MG  --  1.3*  --   --   PHOS  --   --  3.2  --    GFR Estimated Creatinine Clearance: 180.7 mL/min (by C-G formula based on SCr of 0.74 mg/dL). Liver Function Tests: Recent Labs  Lab 05/07/23 1631 05/08/23 0358 05/08/23 1500 05/08/23 1501  AST 13* 11*  --  14*  ALT 16 11  --  19  ALKPHOS 103 60  --  104  BILITOT 0.5 0.4  --  0.3  PROT 8.2* 4.9*  --  8.2*  ALBUMIN 3.7 2.2* 4.0 3.9   No results for input(s): "LIPASE", "AMYLASE" in the last 168  hours. No results for input(s): "AMMONIA" in the last 168 hours. Coagulation profile No results for input(s): "INR", "PROTIME" in the last 168 hours. COVID-19 Labs  Recent Labs    05/07/23 2127  DDIMER 0.39    Lab Results  Component Value Date   SARSCOV2NAA NEGATIVE 10/06/2020    CBC: Recent Labs  Lab 05/07/23 1631 05/08/23 0358  WBC 10.3 10.2  NEUTROABS 5.7 5.6  HGB 14.5 12.1  HCT 47.2* 39.1  MCV 82.2 82.1  PLT 330 270   Cardiac Enzymes: No results for input(s): "CKTOTAL", "CKMB", "CKMBINDEX", "TROPONINI" in the last 168 hours. BNP (last 3 results) No results for input(s): "PROBNP" in the last 8760 hours. CBG: Recent Labs  Lab 05/08/23 2054  GLUCAP 342*   D-Dimer: Recent Labs    05/07/23 2127  DDIMER 0.39   Hgb A1c: No results for input(s): "HGBA1C" in the last 72 hours. Lipid Profile: No results for input(s): "CHOL", "HDL", "LDLCALC", "TRIG", "CHOLHDL", "LDLDIRECT" in the last 72 hours. Thyroid function studies: No results for input(s): "TSH", "T4TOTAL", "T3FREE", "THYROIDAB" in the last 72 hours.  Invalid input(s): "FREET3" Anemia work up: No results for input(s): "VITAMINB12", "FOLATE", "FERRITIN", "TIBC", "IRON", "RETICCTPCT" in the last 72 hours. Sepsis Labs: Recent Labs  Lab 05/07/23 1631 05/07/23 2127 05/08/23 0358  WBC 10.3  --  10.2  LATICACIDVEN 1.5 1.1  --    Microbiology No results found for this or any previous visit (from the past 240 hour(s)).   Medications:    budesonide (PULMICORT) nebulizer solution  0.5 mg Nebulization BID   feeding supplement  237 mL Oral BID BM   insulin aspart  0-20 Units Subcutaneous TID WC   ipratropium-albuterol  3 mL Nebulization QID   norethindrone  10 mg Oral QPM   prochlorperazine  10 mg Intravenous Once   Continuous Infusions:    LOS: 0 days   Marinda Elk  [facility]  05/09/2023, 7:11 AM

## 2023-05-09 NOTE — Progress Notes (Signed)
   05/09/23 2300  BiPAP/CPAP/SIPAP  BiPAP/CPAP/SIPAP Pt Type Adult  BiPAP/CPAP/SIPAP DREAMSTATIOND  Mask Type Full face mask  Mask Size Medium  Respiratory Rate 18 breaths/min  FiO2 (%) 21 %  Patient Home Equipment No  Auto Titrate Yes  CPAP/SIPAP surface wiped down Yes   Pt. able to place on independently, Checked on rounds resting comfortably.

## 2023-05-09 NOTE — Plan of Care (Signed)
Pt alert and oriented x 4. Pt started shift with increased pain requiring pain medication every 2 hours. Gradually progressing to every 3 hours then 4 hours. Heat packs applied to lower abdomen to assist with pain management. CPAP on during overnight. Vitals stable.  Problem: Education: Goal: Knowledge of General Education information will improve Description: Including pain rating scale, medication(s)/side effects and non-pharmacologic comfort measures Outcome: Progressing   Problem: Health Behavior/Discharge Planning: Goal: Ability to manage health-related needs will improve Outcome: Progressing   Problem: Clinical Measurements: Goal: Ability to maintain clinical measurements within normal limits will improve Outcome: Progressing Goal: Will remain free from infection Outcome: Progressing Goal: Diagnostic test results will improve Outcome: Progressing Goal: Respiratory complications will improve Outcome: Progressing Goal: Cardiovascular complication will be avoided Outcome: Progressing   Problem: Activity: Goal: Risk for activity intolerance will decrease Outcome: Progressing   Problem: Nutrition: Goal: Adequate nutrition will be maintained Outcome: Progressing   Problem: Coping: Goal: Level of anxiety will decrease Outcome: Progressing   Problem: Elimination: Goal: Will not experience complications related to bowel motility Outcome: Progressing Goal: Will not experience complications related to urinary retention Outcome: Progressing   Problem: Pain Managment: Goal: General experience of comfort will improve Outcome: Progressing   Problem: Safety: Goal: Ability to remain free from injury will improve Outcome: Progressing   Problem: Skin Integrity: Goal: Risk for impaired skin integrity will decrease Outcome: Progressing   Problem: Education: Goal: Ability to describe self-care measures that may prevent or decrease complications (Diabetes Survival Skills  Education) will improve Outcome: Progressing Goal: Individualized Educational Video(s) Outcome: Progressing   Problem: Coping: Goal: Ability to adjust to condition or change in health will improve Outcome: Progressing   Problem: Fluid Volume: Goal: Ability to maintain a balanced intake and output will improve Outcome: Progressing   Problem: Health Behavior/Discharge Planning: Goal: Ability to identify and utilize available resources and services will improve Outcome: Progressing Goal: Ability to manage health-related needs will improve Outcome: Progressing   Problem: Metabolic: Goal: Ability to maintain appropriate glucose levels will improve Outcome: Progressing   Problem: Nutritional: Goal: Maintenance of adequate nutrition will improve Outcome: Progressing Goal: Progress toward achieving an optimal weight will improve Outcome: Progressing   Problem: Skin Integrity: Goal: Risk for impaired skin integrity will decrease Outcome: Progressing   Problem: Tissue Perfusion: Goal: Adequacy of tissue perfusion will improve Outcome: Progressing

## 2023-05-10 ENCOUNTER — Other Ambulatory Visit (HOSPITAL_COMMUNITY): Payer: Self-pay

## 2023-05-10 ENCOUNTER — Telehealth (HOSPITAL_COMMUNITY): Payer: Self-pay | Admitting: Pharmacy Technician

## 2023-05-10 DIAGNOSIS — E1165 Type 2 diabetes mellitus with hyperglycemia: Secondary | ICD-10-CM | POA: Diagnosis not present

## 2023-05-10 DIAGNOSIS — R55 Syncope and collapse: Secondary | ICD-10-CM | POA: Diagnosis not present

## 2023-05-10 DIAGNOSIS — Z794 Long term (current) use of insulin: Secondary | ICD-10-CM

## 2023-05-10 DIAGNOSIS — R1084 Generalized abdominal pain: Secondary | ICD-10-CM | POA: Diagnosis not present

## 2023-05-10 LAB — GLUCOSE, CAPILLARY
Glucose-Capillary: 139 mg/dL — ABNORMAL HIGH (ref 70–99)
Glucose-Capillary: 121 mg/dL — ABNORMAL HIGH (ref 70–99)

## 2023-05-10 LAB — HEMOGLOBIN A1C
Hgb A1c MFr Bld: 7.7 % — ABNORMAL HIGH (ref 4.8–5.6)
Mean Plasma Glucose: 174 mg/dL

## 2023-05-10 MED ORDER — BLOOD GLUCOSE TEST VI STRP
1.0000 | ORAL_STRIP | Freq: Three times a day (TID) | 0 refills | Status: AC
Start: 2023-05-10 — End: ?

## 2023-05-10 MED ORDER — ALBUTEROL SULFATE HFA 108 (90 BASE) MCG/ACT IN AERS: 1.0000 | INHALATION_SPRAY | Freq: Four times a day (QID) | RESPIRATORY_TRACT | 1 refills | Status: AC | PRN

## 2023-05-10 MED ORDER — LANCET DEVICE MISC
1.0000 | Freq: Three times a day (TID) | 0 refills | Status: AC
Start: 2023-05-10 — End: ?

## 2023-05-10 MED ORDER — IPRATROPIUM-ALBUTEROL 0.5-2.5 (3) MG/3ML IN SOLN
3.0000 mL | Freq: Three times a day (TID) | RESPIRATORY_TRACT | Status: DC
  Administered 2023-05-10: 3 mL via RESPIRATORY_TRACT
  Filled 2023-05-10: qty 3

## 2023-05-10 MED ORDER — PEN NEEDLES 31G X 5 MM MISC
1.0000 | Freq: Three times a day (TID) | 0 refills | Status: DC
Start: 2023-05-10 — End: 2023-05-19

## 2023-05-10 MED ORDER — INSULIN DETEMIR 100 UNIT/ML ~~LOC~~ SOLN
35.0000 [IU] | Freq: Two times a day (BID) | SUBCUTANEOUS | Status: DC
  Administered 2023-05-10: 35 [IU] via SUBCUTANEOUS
  Filled 2023-05-10 (×2): qty 0.35

## 2023-05-10 MED ORDER — INSULIN ASPART 100 UNIT/ML FLEXPEN: 8.0000 [IU] | PEN_INJECTOR | Freq: Three times a day (TID) | SUBCUTANEOUS | 0 refills | Status: DC

## 2023-05-10 MED ORDER — INSULIN DETEMIR 100 UNIT/ML ~~LOC~~ SOLN
40.0000 [IU] | Freq: Two times a day (BID) | SUBCUTANEOUS | Status: DC
  Filled 2023-05-10: qty 0.4

## 2023-05-10 MED ORDER — INSULIN ASPART 100 UNIT/ML IJ SOLN
6.0000 [IU] | Freq: Three times a day (TID) | INTRAMUSCULAR | Status: DC
  Administered 2023-05-10 (×2): 6 [IU] via SUBCUTANEOUS

## 2023-05-10 MED ORDER — INSULIN GLARGINE 100 UNIT/ML SOLOSTAR PEN
70.0000 [IU] | PEN_INJECTOR | Freq: Every day | SUBCUTANEOUS | 0 refills | Status: DC
Start: 2023-05-10 — End: 2023-05-19

## 2023-05-10 MED ORDER — LIVING WELL WITH DIABETES BOOK
Freq: Once | Status: AC
  Filled 2023-05-10: qty 1

## 2023-05-10 MED ORDER — INSULIN STARTER KIT- PEN NEEDLES (ENGLISH)
1.0000 | Freq: Once | Status: AC
  Administered 2023-05-10: 1
  Filled 2023-05-10: qty 1

## 2023-05-10 MED ORDER — BLOOD GLUCOSE MONITORING SUPPL DEVI
1.0000 | Freq: Three times a day (TID) | 0 refills | Status: AC
Start: 2023-05-10 — End: ?

## 2023-05-10 MED ORDER — LANCETS MISC
1.0000 | Freq: Three times a day (TID) | 0 refills | Status: AC
Start: 2023-05-10 — End: ?

## 2023-05-10 NOTE — Discharge Summary (Addendum)
Physician Discharge Summary  Jade Lopez IEP:[id number] DOB: May 02, 1996 DOA: 05/07/2023  PCP: Manuela Neptune, MD  Admit date: 05/07/2023 Discharge date: 05/10/2023  Admitted From: Home Disposition:  Home  Recommendations for Outpatient Follow-up:  Follow up with PCP in 1-2 weeks   Home Health:No Equipment/Devices:None  Discharge Condition:Stable CODE STATUS:Full Diet recommendation: Heart Healthy   Brief/Interim Summary:  27 y.o. female past medical history significant for asthma, obesity with obstructive sleep apnea diabetes mellitus type 2 tubo-ovarian abscess comes in complaining of exacerbation of her pelvic pain for the last several months, subjective fevers, she also relates upper respiratory tract infection and GI symptoms, white count of 10 hemoglobin of 14, mag 1.3 beta-hCG was negative, chest x-ray showed no acute findings, CT scan of the abdomen pelvis with contrast showed no acute intra-abdominal or pelvic processes stable nonspecific submesentery mesenteric lymph node   Discharge Diagnoses:  Principal Problem:   Syncope Active Problems:   Type 2 diabetes mellitus (HCC)   Class 3 obesity (HCC)   OSA on CPAP   Abdominal wall pain   Asthma exacerbation   URI (upper respiratory infection)   Near syncope  Near syncope: Multifactorial due to hyperglycemia leading to dehydration and recent upper respiratory tract infection. She was monitored on telemetry with no events 2D echo was done that showed an EF of 60% with diastolic dysfunction. See uncontrolled diabetes mellitus further details.  Upper respiratory tract infection: She was started on in the ED this was discontinued asthma has been ruled out she just had a runny nose and some nasal congestion.  Obstructive sleep apnea: Continue CPAP at night.  Uncontrolled diabetes mellitus type 2 with hyperglycemia: Blood glucose was significantly elevated on admission in the 300s. She has been noncompliant  with her hypoglycemic agents. She was started on long-acting insulin plus sliding scale. Her A1c is pending at the time of this dictation He is requiring 70 units of long-acting insulin +8 units with meal coverage to control her blood glucose she will continue this regimen as an outpatient follow-up with PCP as an outpatient.  Abdominal wall pain/pelvic pain: She will continue current management and follow-up with [facility] as an outpatient. CT scan of the abdomen and pelvis was done that showed no acute findings. She does have history of endometriosis she related menses started the day prior to admission. Probably her pain is related to her endometriosis.  Discharge Instructions   Allergies as of 05/10/2023       Reactions   Clindamycin/lincomycin Other (See Comments), Hives, Palpitations, Rash, Swelling   Other Reaction(s): Racing heart beat   Gabapentin Hives, Nausea And Vomiting, Palpitations   Other Reaction(s): Unknown   Other Anaphylaxis   Allergic to Rynatan cough syrup.   Chlorpheniramine-phenylephrine Nausea And Vomiting   Pregabalin Rash   Allergic to the generic         Medication List     STOP taking these medications    cyclobenzaprine 10 MG tablet Commonly known as: FLEXERIL   oxyCODONE 5 MG immediate release tablet Commonly known as: Roxicodone   Semaglutide(0.25 or 0.5MG /DOS) 2 MG/3ML Sopn   Vitamin D (Ergocalciferol) 1.25 MG (50000 UNIT) Caps capsule Commonly known as: DRISDOL       TAKE these medications    Accu-Chek Softclix Lancets lancets daily. What changed: Another medication with the same name was added. Make sure you understand how and when to take each.   Lancets Misc 1 each by Does not apply route 3 (three) times daily. Use  as directed to check blood sugar. May dispense any manufacturer covered by patient's insurance and fits patient's device. What changed: You were already taking a medication with the same name, and this prescription  was added. Make sure you understand how and when to take each.   albuterol 108 (90 Base) MCG/ACT inhaler Commonly known as: VENTOLIN HFA Inhale 1-2 puffs into the lungs every 6 (six) hours as needed for wheezing or shortness of breath. What changed:  how much to take Another medication with the same name was removed. Continue taking this medication, and follow the directions you see here.   BLOOD GLUCOSE TEST STRIPS Strp 1 each by Does not apply route 3 (three) times daily. Use as directed to check blood sugar. May dispense any manufacturer covered by patient's insurance and fits patient's device.   busPIRone 10 MG tablet Commonly known as: BUSPAR Take 3 tablets (30 mg total) by mouth daily.   insulin aspart 100 UNIT/ML FlexPen Commonly known as: NOVOLOG Inject 8 Units into the skin 3 (three) times daily with meals. Only take if eating a meal AND Blood Glucose (BG) is 80 or higher.   insulin glargine 100 UNIT/ML Solostar Pen Commonly known as: LANTUS Inject 70 Units into the skin daily. May substitute as needed per insurance.   Lancet Device Misc 1 each by Does not apply route 3 (three) times daily. May dispense any manufacturer covered by patient's insurance.   medroxyPROGESTERone 150 MG/ML injection Commonly known as: DEPO-PROVERA Inject 150 mg into the muscle every 3 (three) months.   MM Easy Touch Glucose Meter w/Device Kit Please check your blood sugar daily, in the morning. What changed: Another medication with the same name was added. Make sure you understand how and when to take each.   Blood Glucose Monitoring Suppl Devi 1 each by Does not apply route 3 (three) times daily. May dispense any manufacturer covered by patient's insurance. What changed: You were already taking a medication with the same name, and this prescription was added. Make sure you understand how and when to take each.   norethindrone 5 MG tablet Commonly known as: AYGESTIN Take 10 mg by mouth  every evening.   Pen Needles 31G X 5 MM Misc 1 each by Does not apply route 3 (three) times daily. May dispense any manufacturer covered by patient's insurance.   pregabalin 50 MG capsule Commonly known as: Lyrica Take 1 capsule (50 mg total) by mouth 3 (three) times daily.        Allergies  Allergen Reactions   Clindamycin/Lincomycin Other (See Comments), Hives, Palpitations, Rash and Swelling    Other Reaction(s): Racing heart beat   Gabapentin Hives, Nausea And Vomiting and Palpitations    Other Reaction(s): Unknown   Other Anaphylaxis    Allergic to Rynatan cough syrup.   Chlorpheniramine-Phenylephrine Nausea And Vomiting   Pregabalin Rash    Allergic to the generic     Consultations: None   Procedures/Studies: VAS US CAROTID  Result Date: 05/09/2023 Carotid Arterial Duplex Study Patient Name:  Jade Lopez  Date of Exam:   05/08/2023 Medical Rec #: [id number]       Accession #:    [id number] Date of Birth: 07/06/1996       Patient Gender: F Patient Age:   50 years Exam Location:  [facility] Procedure:      VAS US CAROTID Referring Phys: DAVID ORTIZ --------------------------------------------------------------------------------  Indications:       Syncope. Risk Factors:  Diabetes. Limitations        Today's exam was limited due to the body habitus of the                    patient and the patient's respiratory variation. Comparison Study:  No prior studies. Performing Technologist: Chanda Busing RVT  Examination Guidelines: A complete evaluation includes B-mode imaging, spectral Doppler, color Doppler, and power Doppler as needed of all accessible portions of each vessel. Bilateral testing is considered an integral part of a complete examination. Limited examinations for reoccurring indications may be performed as noted.  Right Carotid Findings: +----------+--------+--------+--------+-----------------------+--------+           PSV cm/sEDV cm/sStenosisPlaque  Description     Comments +----------+--------+--------+--------+-----------------------+--------+ CCA Prox  98      17                                              +----------+--------+--------+--------+-----------------------+--------+ CCA Distal98      30                                              +----------+--------+--------+--------+-----------------------+--------+ ICA Prox  107     41              smooth and heterogenous         +----------+--------+--------+--------+-----------------------+--------+ ICA Mid   96      40                                              +----------+--------+--------+--------+-----------------------+--------+ ICA Distal97      54                                              +----------+--------+--------+--------+-----------------------+--------+ ECA       81      19                                              +----------+--------+--------+--------+-----------------------+--------+ +----------+--------+-------+--------+-------------------+           PSV cm/sEDV cmsDescribeArm Pressure (mmHG) +----------+--------+-------+--------+-------------------+ [id number]                                        +----------+--------+-------+--------+-------------------+ +---------+--------+--+--------+--+---------+ VertebralPSV cm/s55EDV cm/s24Antegrade +---------+--------+--+--------+--+---------+  Left Carotid Findings: +----------+--------+--------+--------+-----------------------+--------+           PSV cm/sEDV cm/sStenosisPlaque Description     Comments +----------+--------+--------+--------+-----------------------+--------+ CCA Prox  142     29              smooth and heterogenous         +----------+--------+--------+--------+-----------------------+--------+ CCA Distal95      27              smooth and heterogenous         +----------+--------+--------+--------+-----------------------+--------+ ICA  Prox  86      40              smooth and heterogenous         +----------+--------+--------+--------+-----------------------+--------+ ICA Mid   102     46                                              +----------+--------+--------+--------+-----------------------+--------+ ICA Distal100     46                                              +----------+--------+--------+--------+-----------------------+--------+ ECA       68      4                                               +----------+--------+--------+--------+-----------------------+--------+ +----------+--------+--------+--------+-------------------+           PSV cm/sEDV cm/sDescribeArm Pressure (mmHG) +----------+--------+--------+--------+-------------------+ [id number]                                         +----------+--------+--------+--------+-------------------+ +---------+--------+--+--------+--+---------+ VertebralPSV cm/s57EDV cm/s20Antegrade +---------+--------+--+--------+--+---------+   Summary: Right Carotid: Velocities in the right ICA are consistent with a 1-39% stenosis. Left Carotid: Velocities in the left ICA are consistent with a 1-39% stenosis. Vertebrals: Bilateral vertebral arteries demonstrate antegrade flow. *See table(s) above for measurements and observations.  Electronically signed by Coral Else MD on 05/09/2023 at 11:18:32 AM.    Final    ECHOCARDIOGRAM COMPLETE  Result Date: 05/08/2023    ECHOCARDIOGRAM REPORT   Patient Name:   Jade Lopez Date of Exam: 05/08/2023 Medical Rec #:  [id number]      Height:       64.5 in Accession #:    [id number]     Weight:       380.0 lb Date of Birth:  [birth year]      BSA:          2.584 m Patient Age:    26 years       BP:           126/51 mmHg Patient Gender: F              HR:           90 bpm. Exam Location:  Inpatient Procedure: 2D Echo, Cardiac Doppler and Color Doppler Indications:    Syncope  History:        Patient has no prior  history of Echocardiogram examinations.                 Risk Factors:Diabetes, Obesity and Sleep Apnea. Lupus.  Sonographer:    Milda Smart Referring Phys: [id number] Angie Fava  Sonographer Comments: Patient is obese. Image acquisition challenging due to patient body habitus and Image acquisition challenging due to respiratory motion. IMPRESSIONS  1. Left ventricular ejection fraction, by estimation, is 60 to 65%. The left ventricle has normal function. The left ventricle has no regional wall motion abnormalities. Left ventricular diastolic parameters were normal.  2. Right ventricular systolic  function is normal. The right ventricular size is normal. Tricuspid regurgitation signal is inadequate for assessing PA pressure.  3. The mitral valve is grossly normal. Trivial mitral valve regurgitation. No evidence of mitral stenosis.  4. The aortic valve is tricuspid. Aortic valve regurgitation is not visualized. No aortic stenosis is present.  5. The inferior vena cava is normal in size with greater than 50% respiratory variability, suggesting right atrial pressure of 3 mmHg. Conclusion(s)/Recommendation(s): Normal biventricular function without evidence of hemodynamically significant valvular heart disease. FINDINGS  Left Ventricle: Left ventricular ejection fraction, by estimation, is 60 to 65%. The left ventricle has normal function. The left ventricle has no regional wall motion abnormalities. The left ventricular internal cavity size was normal in size. There is  no left ventricular hypertrophy. Left ventricular diastolic parameters were normal. Right Ventricle: The right ventricular size is normal. No increase in right ventricular wall thickness. Right ventricular systolic function is normal. Tricuspid regurgitation signal is inadequate for assessing PA pressure. Left Atrium: Left atrial size was normal in size. Right Atrium: Right atrial size was normal in size. Pericardium: There is no evidence of  pericardial effusion. Mitral Valve: The mitral valve is grossly normal. Trivial mitral valve regurgitation. No evidence of mitral valve stenosis. Tricuspid Valve: The tricuspid valve is grossly normal. Tricuspid valve regurgitation is not demonstrated. No evidence of tricuspid stenosis. Aortic Valve: The aortic valve is tricuspid. Aortic valve regurgitation is not visualized. No aortic stenosis is present. Pulmonic Valve: The pulmonic valve was grossly normal. Pulmonic valve regurgitation is not visualized. No evidence of pulmonic stenosis. Aorta: The aortic root and ascending aorta are structurally normal, with no evidence of dilitation. Venous: The inferior vena cava is normal in size with greater than 50% respiratory variability, suggesting right atrial pressure of 3 mmHg. IAS/Shunts: The atrial septum is grossly normal.  LEFT VENTRICLE PLAX 2D LVIDd:         3.80 cm     Diastology LVIDs:         3.00 cm     LV e' medial:  9.14 cm/s LV PW:         0.90 cm     LV e' lateral: 14.00 cm/s LV IVS:        0.90 cm LVOT diam:     2.20 cm LV SV:         85 LV SV Index:   33 LVOT Area:     3.80 cm  LV Volumes (MOD) LV vol d, MOD A2C: 93.9 ml LV vol d, MOD A4C: 97.1 ml LV vol s, MOD A2C: 42.0 ml LV vol s, MOD A4C: 47.8 ml LV SV MOD A2C:     51.9 ml LV SV MOD A4C:     97.1 ml LV SV MOD BP:      53.3 ml RIGHT VENTRICLE             IVC RV Basal diam:  3.30 cm     IVC diam: 1.20 cm RV S prime:     11.40 cm/s TAPSE (M-mode): 2.0 cm LEFT ATRIUM             Index        RIGHT ATRIUM           Index LA diam:        3.30 cm 1.28 cm/m   RA Area:     11.90 cm LA Vol (A2C):   40.7 ml 15.75 ml/m  RA Volume:   26.10  ml  10.10 ml/m LA Vol (A4C):   37.1 ml 14.36 ml/m LA Biplane Vol: 39.0 ml 15.09 ml/m  AORTIC VALVE LVOT Vmax:   118.00 cm/s LVOT Vmean:  85.600 cm/s LVOT VTI:    0.223 m  AORTA Ao Root diam: 3.00 cm Ao Asc diam:  2.60 cm  SHUNTS Systemic VTI:  0.22 m Systemic Diam: 2.20 cm Lennie Odor MD Electronically signed by  Lennie Odor MD Signature Date/Time: 05/08/2023/3:04:10 PM    Final    CT ABDOMEN PELVIS W CONTRAST  Result Date: 05/07/2023 CLINICAL DATA:  Bilateral lower abdominal pain, pelvic pain, fever EXAM: CT ABDOMEN AND PELVIS WITH CONTRAST TECHNIQUE: Multidetector CT imaging of the abdomen and pelvis was performed using the standard protocol following bolus administration of intravenous contrast. RADIATION DOSE REDUCTION: This exam was performed according to the departmental dose-optimization program which includes automated exposure control, adjustment of the mA and/or kV according to patient size and/or use of iterative reconstruction technique. CONTRAST:  OMNIPAQUE IOHEXOL 300 MG/ML  SOLN COMPARISON:  03/04/2023 FINDINGS: Lower chest: No acute pleural or parenchymal lung disease. Hepatobiliary: No focal liver abnormality is seen. Status post cholecystectomy. No biliary dilatation. Pancreas: Unremarkable. No pancreatic ductal dilatation or surrounding inflammatory changes. Spleen: Normal in size without focal abnormality. Adrenals/Urinary Tract: Adrenal glands are unremarkable. Kidneys are normal, without renal calculi, focal lesion, or hydronephrosis. Bladder is unremarkable. Stomach/Bowel: No bowel obstruction or ileus. Normal appendix right lower quadrant. No bowel wall thickening or inflammatory change. Vascular/Lymphatic: No significant vascular findings are present. No enlarged abdominal or pelvic lymph nodes. Multiple small central mesenteric lymph nodes are again seen, nonspecific. Reproductive: Uterus and bilateral adnexa are unremarkable. Other: No free fluid or free intraperitoneal gas. Small fat containing umbilical hernia. No bowel herniation. Musculoskeletal: No acute or destructive bony abnormalities. Reconstructed images demonstrate no additional findings. IMPRESSION: 1. No acute intra-abdominal or intrapelvic process. 2. Stable nonspecific subcentimeter central mesenteric lymph nodes. No  pathologic adenopathy. Electronically Signed   By: Sharlet Salina M.D.   On: 05/07/2023 20:20   DG Chest Portable 1 View  Result Date: 05/07/2023 CLINICAL DATA:  Near syncope EXAM: PORTABLE CHEST 1 VIEW COMPARISON:  None Available. FINDINGS: The heart size and mediastinal contours are within normal limits. Both lungs are clear. The visualized skeletal structures are unremarkable. IMPRESSION: No active disease. Electronically Signed   By: Sharlet Salina M.D.   On: 05/07/2023 18:23   [place] LIMITED ULTRASOUND INCLUDING AXILLA RIGHT BREAST  Addendum Date: 04/22/2023   ADDENDUM REPORT: 04/22/2023 10:18 ADDENDUM: Exam title is in error.  This was an ultrasound of the right breast. Electronically Signed   By: Amie Portland M.D.   On: 04/22/2023 10:18   Result Date: 04/22/2023 CLINICAL DATA:  Patient the palpable tender lump in the right breast areola, as well as complaints of having small nodules and shooting pains in the upper right breast. She has had a recurring abscess in the location of the areola lump and was assessed with ultrasound on 03/16/2023, at which time she was placed on a course of Bactrim. She was subsequently evaluated at breast center on 03/18/2023, where she underwent ultrasound-guided aspiration. On 04/17/2023 she was reassessed with ultrasound, at which time she was noted have a similar-sized collection. She was again placed on Bactrim and has significantly improved. She also reports that she was referred to a surgeon, but has so far not been able to obtain an appointment. EXAM: ULTRASOUND OF THE LEFT BREAST COMPARISON:  Previous exam(s). FINDINGS:  On physical exam, there is a firm nodule that palpates within the skin of the medial areola, with some associated tenderness. No discrete nodule palpated in the upper to upper inner right breast. Targeted ultrasound is performed, showing a hypoechoic ill-defined masslike area with associated skin thickening in the 3 o'clock areolar/retroareolar  location measuring 3 x 0.6 x 1.9 cm. There is color Doppler blood flow along the margins of this. There is no definitive internal fluid. In the upper inner quadrant there is a small cyst, likely an oil cyst, at 1 o'clock, 7 cm the nipple, measuring 5 mm. There are no other abnormalities. IMPRESSION: 1. Residual abscess/phlegmon arising from the medial right areola, decreased in size from the exam dated 04/17/2023, and with no current convincing drainable fluid. 2. Small benign oil cyst in the upper inner right breast at 1 o'clock. RECOMMENDATION: 1. Complete the previously prescribed Bactrim antibiotic course. 2. A referral for surgical follow-up will be arranged. I have discussed the findings and recommendations with the patient. If applicable, a reminder letter will be sent to the patient regarding the next appointment. BI-RADS CATEGORY  2: Benign. Electronically Signed: By: Amie Portland M.D. On: 04/22/2023 08:09  [place] LIMITED ULTRASOUND INCLUDING AXILLA RIGHT BREAST  Result Date: 04/17/2023 CLINICAL DATA:  History of breast abscess. Now with retroareolar pain. EXAM: LIMITED ULTRASOUND OF THE RIGHT BREAST COMPARISON:  03/18/2023 FINDINGS: Targeted ultrasound is performed, showing retroareolar complex hypoechoic structure measuring 2.8 x 0.6 x 1.8 cm, similar to 2.4 x 1.3 x 1.5 cm structure seen on the previous study. Overlying skin thickening evident. IMPRESSION: Complex hypoechoic lesion deep to the right nipple shows enhanced through transmission. Small recurrent abscess a concern. RECOMMENDATION: Referral to diagnostic mammography imaging center recommended. Electronically Signed   By: Kennith Center M.D.   On: 04/17/2023 13:07   (Echo, Carotid, EGD, Colonoscopy, ERCP)    Subjective: No complaints  Discharge Exam: Vitals:   05/09/23 2043 05/10/23 0840  BP:    Pulse:    Resp:    Temp:    SpO2: 96% 96%   Vitals:   05/09/23 1510 05/09/23 1925 05/09/23 2043 05/10/23 0840  BP: (!) 138/91 119/76     Pulse: (!) 108 (!) 105    Resp: 16 16    Temp: 98.2 F (36.8 C) 98.7 F (37.1 C)    TempSrc:  Oral    SpO2: 100% 100% 96% 96%  Weight:      Height:        General: Pt is alert, awake, not in acute distress Cardiovascular: RRR, S1/S2 +, no rubs, no gallops Respiratory: CTA bilaterally, no wheezing, no rhonchi Abdominal: Soft, NT, ND, bowel sounds + Extremities: no edema, no cyanosis    The results of significant diagnostics from this hospitalization (including imaging, microbiology, ancillary and laboratory) are listed below for reference.     Microbiology: No results found for this or any previous visit (from the past 240 hour(s)).   Labs: BNP (last 3 results) No results for input(s): "BNP" in the last 8760 hours. Basic Metabolic Panel: Recent Labs  Lab 05/07/23 1631 05/08/23 0358 05/08/23 1500 05/08/23 1501 05/09/23 0709  NA 137 137 134* 135 133*  K 3.8 2.5* 4.9 5.0 3.9  CL 105 116* 105 105 103  CO2 23 17* 19* 20* 20*  GLUCOSE 143* 151* 207* 208* 268*  BUN 11 8 9 9 10   CREATININE 0.77 0.55 0.79 0.74 0.69  CALCIUM 9.1 6.0* 9.3 9.4 8.5*  MG  --  1.3*  --   --   --   PHOS  --   --  3.2  --   --    Liver Function Tests: Recent Labs  Lab 05/07/23 1631 05/08/23 0358 05/08/23 1500 05/08/23 1501 05/09/23 0709  AST 13* 11*  --  14* 11*  ALT 16 11  --  19 16  ALKPHOS 103 60  --  104 92  BILITOT 0.5 0.4  --  0.3 0.4  PROT 8.2* 4.9*  --  8.2* 7.1  ALBUMIN 3.7 2.2* 4.0 3.9 3.3*   No results for input(s): "LIPASE", "AMYLASE" in the last 168 hours. No results for input(s): "AMMONIA" in the last 168 hours. CBC: Recent Labs  Lab 05/07/23 1631 05/08/23 0358 05/09/23 0728  WBC 10.3 10.2 14.4*  NEUTROABS 5.7 5.6 11.9*  HGB 14.5 12.1 13.1  HCT 47.2* 39.1 43.1  MCV 82.2 82.1 82.9  PLT 330 270 341   Cardiac Enzymes: No results for input(s): "CKTOTAL", "CKMB", "CKMBINDEX", "TROPONINI" in the last 168 hours. BNP: Invalid input(s): "POCBNP" CBG: Recent Labs   Lab 05/09/23 1508 05/09/23 1705 05/09/23 2023 05/10/23 0729 05/10/23 1153  GLUCAP 207* 202* 236* 139* 121*   D-Dimer Recent Labs    05/07/23 2127  DDIMER 0.39   Hgb A1c Recent Labs    05/09/23 0709  HGBA1C 7.7*   Lipid Profile No results for input(s): "CHOL", "HDL", "LDLCALC", "TRIG", "CHOLHDL", "LDLDIRECT" in the last 72 hours. Thyroid function studies No results for input(s): "TSH", "T4TOTAL", "T3FREE", "THYROIDAB" in the last 72 hours.  Invalid input(s): "FREET3" Anemia work up No results for input(s): "VITAMINB12", "FOLATE", "FERRITIN", "TIBC", "IRON", "RETICCTPCT" in the last 72 hours. Urinalysis    Component Value Date/Time   COLORURINE YELLOW 03/04/2023 1315   APPEARANCEUR HAZY (A) 03/04/2023 1315   APPEARANCEUR Clear 12/14/2022 1403   LABSPEC 1.018 03/04/2023 1315   PHURINE 6.0 03/04/2023 1315   GLUCOSEU NEGATIVE 03/04/2023 1315   HGBUR NEGATIVE 03/04/2023 1315   BILIRUBINUR NEGATIVE 03/04/2023 1315   BILIRUBINUR negative 12/14/2022 1418   BILIRUBINUR Negative 12/14/2022 1403   KETONESUR NEGATIVE 03/04/2023 1315   PROTEINUR NEGATIVE 03/04/2023 1315   UROBILINOGEN 0.2 12/14/2022 1418   NITRITE NEGATIVE 03/04/2023 1315   LEUKOCYTESUR NEGATIVE 03/04/2023 1315   Sepsis Labs Recent Labs  Lab 05/07/23 1631 05/08/23 0358 05/09/23 0728  WBC 10.3 10.2 14.4*   Microbiology No results found for this or any previous visit (from the past 240 hour(s)).   SIGNED:   Marinda Elk, MD  [facility] 05/10/2023, 1:48 PM Pager   If 7PM-7AM, please contact night-coverage [email or web address] Password [email or web address]

## 2023-05-10 NOTE — Progress Notes (Signed)
[facility] PROGRESS NOTE    Progress Note  Meera Wilhite  ZOX:[id number] DOB: 1996-07-19 DOA: 05/07/2023 PCP: Manuela Neptune, MD     Brief Narrative:   Dystiny Sehorn is an 27 y.o. female past medical history significant for asthma, obesity with obstructive sleep apnea diabetes mellitus type 2 tubo-ovarian abscess comes in complaining of exacerbation of her pelvic pain for the last several months, subjective fevers, she also relates upper respiratory tract infection and GI symptoms, white count of 10 hemoglobin of 14, mag 1.3 beta-hCG was negative, chest x-ray showed no acute findings, CT scan of the abdomen pelvis with contrast showed no acute intra-abdominal or pelvic processes stable nonspecific submesentery mesenteric lymph node   Assessment/Plan:   Near syncope; Events on telemetry, 2D echo showed an EF of 60% no diastolic dysfunction right ventricular function is unremarkable valves are unremarkable Discontinue telemetry  Upper respiratory infection/questionable asthma exacerbation: She was given IV steroids in the ED this was discontinued she is at 90% on room air she just has a runny nose and some nasal congestion.  Struct of sleep apnea: Continue CPAP at night.  Uncontrolled diabetes mellitus type 2 with hyperglycemia Blood glucose still elevated despite long-acting insulin plus sliding scale double lobe acting insulin continue sliding scale. A1c is pending. Awaiting diabetes coordinator assistance.  Morbid obesity: With a BMI of 70 she has been counseled.  Abdominal wall/pelvic pain: Continue current medication follow-up with GYN as an outpatient. CT scan of the abdomen pelvis showed no acute findings. She has a history of endometriosis, she relates yesterday.  Probably her pain is likely due to her endometriosis    DVT prophylaxis: lovenox Family Communication:mother Status is: Observation The patient remains OBS appropriate and will d/c  before 2 midnights.    Code Status:     Code Status Orders  (From admission, onward)           Start     Ordered   05/08/23 0047  Full code  Continuous       Question:  By:  Answer:  Consent: discussion documented in EHR   05/08/23 0046           Code Status History     This patient has a current code status but no historical code status.         IV Access:   Peripheral IV   Procedures and diagnostic studies:   VAS US CAROTID  Result Date: 05/09/2023 Carotid Arterial Duplex Study Patient Name:  SHRIYA ROGER  Date of Exam:   05/08/2023 Medical Rec #: [id number]       Accession #:    [id number] Date of Birth: 04-Mar-1996       Patient Gender: F Patient Age:   5 years Exam Location:  [facility] Procedure:      VAS US CAROTID Referring Phys: DAVID ORTIZ --------------------------------------------------------------------------------  Indications:       Syncope. Risk Factors:      Diabetes. Limitations        Today's exam was limited due to the body habitus of the                    patient and the patient's respiratory variation. Comparison Study:  No prior studies. Performing Technologist: Chanda Busing RVT  Examination Guidelines: A complete evaluation includes B-mode imaging, spectral Doppler, color Doppler, and power Doppler as needed of all accessible portions of each vessel. Bilateral testing is considered an integral part of a complete examination.  Limited examinations for reoccurring indications may be performed as noted.  Right Carotid Findings: +----------+--------+--------+--------+-----------------------+--------+           PSV cm/sEDV cm/sStenosisPlaque Description     Comments +----------+--------+--------+--------+-----------------------+--------+ CCA Prox  98      17                                              +----------+--------+--------+--------+-----------------------+--------+ CCA Distal98      30                                               +----------+--------+--------+--------+-----------------------+--------+ ICA Prox  107     41              smooth and heterogenous         +----------+--------+--------+--------+-----------------------+--------+ ICA Mid   96      40                                              +----------+--------+--------+--------+-----------------------+--------+ ICA Distal97      54                                              +----------+--------+--------+--------+-----------------------+--------+ ECA       81      19                                              +----------+--------+--------+--------+-----------------------+--------+ +----------+--------+-------+--------+-------------------+           PSV cm/sEDV cmsDescribeArm Pressure (mmHG) +----------+--------+-------+--------+-------------------+ [id number]                                        +----------+--------+-------+--------+-------------------+ +---------+--------+--+--------+--+---------+ VertebralPSV cm/s55EDV cm/s24Antegrade +---------+--------+--+--------+--+---------+  Left Carotid Findings: +----------+--------+--------+--------+-----------------------+--------+           PSV cm/sEDV cm/sStenosisPlaque Description     Comments +----------+--------+--------+--------+-----------------------+--------+ CCA Prox  142     29              smooth and heterogenous         +----------+--------+--------+--------+-----------------------+--------+ CCA Distal95      27              smooth and heterogenous         +----------+--------+--------+--------+-----------------------+--------+ ICA Prox  86      40              smooth and heterogenous         +----------+--------+--------+--------+-----------------------+--------+ ICA Mid   102     46                                              +----------+--------+--------+--------+-----------------------+--------+ ICA Distal100  46                                              +----------+--------+--------+--------+-----------------------+--------+ ECA       68      4                                               +----------+--------+--------+--------+-----------------------+--------+ +----------+--------+--------+--------+-------------------+           PSV cm/sEDV cm/sDescribeArm Pressure (mmHG) +----------+--------+--------+--------+-------------------+ [id number]                                         +----------+--------+--------+--------+-------------------+ +---------+--------+--+--------+--+---------+ VertebralPSV cm/s57EDV cm/s20Antegrade +---------+--------+--+--------+--+---------+   Summary: Right Carotid: Velocities in the right ICA are consistent with a 1-39% stenosis. Left Carotid: Velocities in the left ICA are consistent with a 1-39% stenosis. Vertebrals: Bilateral vertebral arteries demonstrate antegrade flow. *See table(s) above for measurements and observations.  Electronically signed by Coral Else MD on 05/09/2023 at 11:18:32 AM.    Final    ECHOCARDIOGRAM COMPLETE  Result Date: 05/08/2023    ECHOCARDIOGRAM REPORT   Patient Name:   CHRYSTAL BURGARD Date of Exam: 05/08/2023 Medical Rec #:  [id number]      Height:       64.5 in Accession #:    [id number]     Weight:       380.0 lb Date of Birth:  1996-05-04      BSA:          2.584 m Patient Age:    26 years       BP:           126/51 mmHg Patient Gender: F              HR:           90 bpm. Exam Location:  Inpatient Procedure: 2D Echo, Cardiac Doppler and Color Doppler Indications:    Syncope  History:        Patient has no prior history of Echocardiogram examinations.                 Risk Factors:Diabetes, Obesity and Sleep Apnea. Lupus.  Sonographer:    Milda Smart Referring Phys: [id number] Angie Fava  Sonographer Comments: Patient is obese. Image acquisition challenging due to patient body habitus and Image acquisition  challenging due to respiratory motion. IMPRESSIONS  1. Left ventricular ejection fraction, by estimation, is 60 to 65%. The left ventricle has normal function. The left ventricle has no regional wall motion abnormalities. Left ventricular diastolic parameters were normal.  2. Right ventricular systolic function is normal. The right ventricular size is normal. Tricuspid regurgitation signal is inadequate for assessing PA pressure.  3. The mitral valve is grossly normal. Trivial mitral valve regurgitation. No evidence of mitral stenosis.  4. The aortic valve is tricuspid. Aortic valve regurgitation is not visualized. No aortic stenosis is present.  5. The inferior vena cava is normal in size with greater than 50% respiratory variability, suggesting right atrial pressure of 3 mmHg. Conclusion(s)/Recommendation(s): Normal biventricular function without evidence of hemodynamically significant valvular heart disease. FINDINGS  Left Ventricle: Left ventricular ejection fraction,  by estimation, is 60 to 65%. The left ventricle has normal function. The left ventricle has no regional wall motion abnormalities. The left ventricular internal cavity size was normal in size. There is  no left ventricular hypertrophy. Left ventricular diastolic parameters were normal. Right Ventricle: The right ventricular size is normal. No increase in right ventricular wall thickness. Right ventricular systolic function is normal. Tricuspid regurgitation signal is inadequate for assessing PA pressure. Left Atrium: Left atrial size was normal in size. Right Atrium: Right atrial size was normal in size. Pericardium: There is no evidence of pericardial effusion. Mitral Valve: The mitral valve is grossly normal. Trivial mitral valve regurgitation. No evidence of mitral valve stenosis. Tricuspid Valve: The tricuspid valve is grossly normal. Tricuspid valve regurgitation is not demonstrated. No evidence of tricuspid stenosis. Aortic Valve: The aortic  valve is tricuspid. Aortic valve regurgitation is not visualized. No aortic stenosis is present. Pulmonic Valve: The pulmonic valve was grossly normal. Pulmonic valve regurgitation is not visualized. No evidence of pulmonic stenosis. Aorta: The aortic root and ascending aorta are structurally normal, with no evidence of dilitation. Venous: The inferior vena cava is normal in size with greater than 50% respiratory variability, suggesting right atrial pressure of 3 mmHg. IAS/Shunts: The atrial septum is grossly normal.  LEFT VENTRICLE PLAX 2D LVIDd:         3.80 cm     Diastology LVIDs:         3.00 cm     LV e' medial:  9.14 cm/s LV PW:         0.90 cm     LV e' lateral: 14.00 cm/s LV IVS:        0.90 cm LVOT diam:     2.20 cm LV SV:         85 LV SV Index:   33 LVOT Area:     3.80 cm  LV Volumes (MOD) LV vol d, MOD A2C: 93.9 ml LV vol d, MOD A4C: 97.1 ml LV vol s, MOD A2C: 42.0 ml LV vol s, MOD A4C: 47.8 ml LV SV MOD A2C:     51.9 ml LV SV MOD A4C:     97.1 ml LV SV MOD BP:      53.3 ml RIGHT VENTRICLE             IVC RV Basal diam:  3.30 cm     IVC diam: 1.20 cm RV S prime:     11.40 cm/s TAPSE (M-mode): 2.0 cm LEFT ATRIUM             Index        RIGHT ATRIUM           Index LA diam:        3.30 cm 1.28 cm/m   RA Area:     11.90 cm LA Vol (A2C):   40.7 ml 15.75 ml/m  RA Volume:   26.10 ml  10.10 ml/m LA Vol (A4C):   37.1 ml 14.36 ml/m LA Biplane Vol: 39.0 ml 15.09 ml/m  AORTIC VALVE LVOT Vmax:   118.00 cm/s LVOT Vmean:  85.600 cm/s LVOT VTI:    0.223 m  AORTA Ao Root diam: 3.00 cm Ao Asc diam:  2.60 cm  SHUNTS Systemic VTI:  0.22 m Systemic Diam: 2.20 cm Lennie Odor MD Electronically signed by Lennie Odor MD Signature Date/Time: 05/08/2023/3:04:10 PM    Final      Medical Consultants:   None.   Subjective:  Melven Sartorius she relates she still has mild pain hungry.  Objective:    Vitals:   05/09/23 0753 05/09/23 1510 05/09/23 1925 05/09/23 2043  BP:  (!) 138/91 119/76   Pulse:  (!)  108 (!) 105   Resp:  16 16   Temp:  98.2 F (36.8 C) 98.7 F (37.1 C)   TempSrc:   Oral   SpO2: 95% 100% 100% 96%  Weight:      Height:       SpO2: 96 % FiO2 (%): 21 %   Intake/Output Summary (Last 24 hours) at 05/10/2023 0716 Last data filed at 05/09/2023 1211 Gross per 24 hour  Intake 827 ml  Output 600 ml  Net 227 ml    Filed Weights   05/07/23 1517 05/09/23 0500  Weight: (!) 172.4 kg (!) 184.7 kg    Exam: General exam: In no acute distress. Respiratory system: Good air movement and clear to auscultation. Cardiovascular system: S1 & S2 heard, RRR. No JVD. Gastrointestinal system: Abdomen is nondistended, soft and nontender.  Extremities: No pedal edema. Skin: No rashes, lesions or ulcers Psychiatry: Judgement and insight appear normal. Mood & affect appropriate. Data Reviewed:    Labs: Basic Metabolic Panel: Recent Labs  Lab 05/07/23 1631 05/08/23 0358 05/08/23 1500 05/08/23 1501 05/09/23 0709  NA 137 137 134* 135 133*  K 3.8 2.5* 4.9 5.0 3.9  CL 105 116* 105 105 103  CO2 23 17* 19* 20* 20*  GLUCOSE 143* 151* 207* 208* 268*  BUN 11 8 9 9 10   CREATININE 0.77 0.55 0.79 0.74 0.69  CALCIUM 9.1 6.0* 9.3 9.4 8.5*  MG  --  1.3*  --   --   --   PHOS  --   --  3.2  --   --     GFR Estimated Creatinine Clearance: 180.7 mL/min (by C-G formula based on SCr of 0.69 mg/dL). Liver Function Tests: Recent Labs  Lab 05/07/23 1631 05/08/23 0358 05/08/23 1500 05/08/23 1501 05/09/23 0709  AST 13* 11*  --  14* 11*  ALT 16 11  --  19 16  ALKPHOS 103 60  --  104 92  BILITOT 0.5 0.4  --  0.3 0.4  PROT 8.2* 4.9*  --  8.2* 7.1  ALBUMIN 3.7 2.2* 4.0 3.9 3.3*    No results for input(s): "LIPASE", "AMYLASE" in the last 168 hours. No results for input(s): "AMMONIA" in the last 168 hours. Coagulation profile No results for input(s): "INR", "PROTIME" in the last 168 hours. COVID-19 Labs  Recent Labs    05/07/23 2127  DDIMER 0.39     Lab Results  Component  Value Date   SARSCOV2NAA NEGATIVE 10/06/2020    CBC: Recent Labs  Lab 05/07/23 1631 05/08/23 0358 05/09/23 0728  WBC 10.3 10.2 14.4*  NEUTROABS 5.7 5.6 11.9*  HGB 14.5 12.1 13.1  HCT 47.2* 39.1 43.1  MCV 82.2 82.1 82.9  PLT 330 270 341    Cardiac Enzymes: No results for input(s): "CKTOTAL", "CKMB", "CKMBINDEX", "TROPONINI" in the last 168 hours. BNP (last 3 results) No results for input(s): "PROBNP" in the last 8760 hours. CBG: Recent Labs  Lab 05/09/23 0741 05/09/23 1136 05/09/23 1508 05/09/23 1705 05/09/23 2023  GLUCAP 259* 270* 207* 202* 236*    D-Dimer: Recent Labs    05/07/23 2127  DDIMER 0.39    Hgb A1c: No results for input(s): "HGBA1C" in the last 72 hours. Lipid Profile: No results for input(s): "CHOL", "HDL", "LDLCALC", "TRIG", "  CHOLHDL", "LDLDIRECT" in the last 72 hours. Thyroid function studies: No results for input(s): "TSH", "T4TOTAL", "T3FREE", "THYROIDAB" in the last 72 hours.  Invalid input(s): "FREET3" Anemia work up: No results for input(s): "VITAMINB12", "FOLATE", "FERRITIN", "TIBC", "IRON", "RETICCTPCT" in the last 72 hours. Sepsis Labs: Recent Labs  Lab 05/07/23 1631 05/07/23 2127 05/08/23 0358 05/09/23 0728  WBC 10.3  --  10.2 14.4*  LATICACIDVEN 1.5 1.1  --   --     Microbiology No results found for this or any previous visit (from the past 240 hour(s)).   Medications:    budesonide (PULMICORT) nebulizer solution  0.5 mg Nebulization BID   feeding supplement  237 mL Oral BID BM   insulin aspart  0-15 Units Subcutaneous TID WC   insulin aspart  0-5 Units Subcutaneous QHS   insulin aspart  6 Units Subcutaneous TID WC   insulin detemir  40 Units Subcutaneous BID   ipratropium-albuterol  3 mL Nebulization QID   norethindrone  10 mg Oral QPM   prochlorperazine  10 mg Intravenous Once   Continuous Infusions:    LOS: 1 day   Marinda Elk  [facility]  05/10/2023, 7:16 AM

## 2023-05-10 NOTE — Telephone Encounter (Signed)
Pharmacy Patient Advocate Encounter   Received notification that prior authorization for Lantus SoloStar 100UNIT/ML pen-injectors is required/requested.    PA started via CoverMyMeds. KEY [id number] . Waiting for clinical questions to populate.

## 2023-05-10 NOTE — Inpatient Diabetes Management (Signed)
Inpatient Diabetes Program Recommendations  AACE/ADA: New Consensus Statement on Inpatient Glycemic Control (2015)  Target Ranges:  Prepandial:   less than 140 mg/dL      Peak postprandial:   less than 180 mg/dL (1-2 hours)      Critically ill patients:  140 - 180 mg/dL   Lab Results  Component Value Date   GLUCAP 139 (H) 05/10/2023   HGBA1C 8.3 (H) 11/25/2022    Review of Glycemic Control  Diabetes history: DM2 Outpatient Diabetes medications: None at present Current orders for Inpatient glycemic control: Levemir 35 BID, Novolog 0-15 TID with meals and 0-5 HS + 6 units TID  HgbA1C - 8.3%  Inpatient Diabetes Program Recommendations:    Spoke with patient about new diabetes diagnosis.  Discussed A1C results (8.3%) and explained what an A1C is and informed patient that his current A1C indicates an average glucose of 192 mg/dl over the past 2-3 months. Discussed basic pathophysiology of DM Type 2, basic home care, importance of checking CBGs and maintaining good CBG control to prevent long-term and short-term complications. Reviewed glucose and A1C goals and explained that patient will need to continue to  Reviewed signs and symptoms of hyperglycemia and hypoglycemia along with treatment for both. Discussed impact of nutrition, exercise, stress, sickness, and medications on diabetes control. Reviewed Living Well with diabetes booklet and encouraged patient to read through entire book. Pt states she goes through periods of not eating. Appears very depressed.  Discussed importance of eating 3 meals and not skipping meals to control blood sugars. Pt states she will f/u with her PCP.  Educated patient on insulin pen use at home. Reviewed contents of insulin flexpen starter kit. Reviewed all steps if insulin pen including attachment of needle, 2-unit air shot, dialing up dose, giving injection, removing needle, disposal of sharps, storage of unused insulin, disposal of insulin etc. Patient able to  provide successful return demonstration. Also reviewed troubleshooting with insulin pen. MD to give patient Rxs for insulin pens and insulin pen needles.  Sent message to Pharmacy regarding which insulins are preferred on pt's insurance. Both Novolog and Semglee pens are preferred for $4 copay.  Semglee 25 units BID Novolog 0-20 TID with meals + 10 units TID  Diabetes Coordination Discharge Recommendations provided.  Thank you. Ailene Ards, RD, LDN, CDCES Inpatient Diabetes Coordinator [phone]

## 2023-05-10 NOTE — TOC CM/SW Note (Signed)
Transition of Care [facility]) - Inpatient Brief Assessment   Patient Details  Name: Jade Lopez MRN: [id number] Date of Birth: [birth year]  Transition of [facility]) CM/SW Contact:    Otelia Santee, LCSW Phone Number: 05/10/2023, 1:44 PM   Clinical Narrative: Met with pt to discuss SDOH concern for food. 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 Pt is currently receiving outpatient therapy for this. Pt does request information for financial assistance in case she is unable to work. Resource placed on AVS   Transition of Care Asessment: Insurance and Status: Insurance coverage has been reviewed Patient has primary care physician: Yes Home environment has been reviewed: Home alone Prior level of function:: Independent Prior/Current Home Services: No current home services Social Determinants of Health Reivew: SDOH reviewed interventions complete Readmission risk has been reviewed: Yes Transition of care needs: no transition of care needs at this time

## 2023-05-10 NOTE — TOC Benefit Eligibility Note (Signed)
Pharmacy Patient Advocate Encounter  Insurance verification completed.    The patient is insured through [employer] and [employer]  Ran test claim for [employer] Pen and the current 30 day co-pay is $4.00.  Ran test claim for Novolog FlexPen and the current 30 day co-pay is $4.00.  Ran test claim for [employer] and requires Prior Authorization  Ran test claim for [employer] and requires Prior Authorization  This test claim was processed through [employer]- copay amounts may vary at other pharmacies due to [employer], or as the patient moves through the different stages of their insurance plan.    Roland Earl, CPHT Pharmacy Patient Advocate Specialist [facility] Patient Advocate Team Direct Number: [phone]  Fax: [phone]

## 2023-05-11 ENCOUNTER — Encounter: Payer: Self-pay | Admitting: Student

## 2023-05-11 ENCOUNTER — Encounter: Payer: Self-pay | Admitting: Obstetrics and Gynecology

## 2023-05-11 ENCOUNTER — Telehealth: Payer: Self-pay

## 2023-05-11 NOTE — Transitions of Care (Post Inpatient/ED Visit) (Signed)
05/11/2023  Name: Jade Lopez MRN: [id number] DOB: 06-01-1996  Today's TOC FU Call Status: Today's TOC FU Call Status:: Successful TOC FU Call Competed TOC FU Call Complete Date: 05/11/23  Transition Care Management Follow-up Telephone Call Date of Discharge: 05/10/23 Discharge Facility: [facility]) Type of Discharge: Inpatient Admission Primary Inpatient Discharge Diagnosis:: syncope How have you been since you were released from the hospital?: Same Any questions or concerns?: No  Items Reviewed: Did you receive and understand the discharge instructions provided?: Yes Medications obtained,verified, and reconciled?: Yes (Medications Reviewed) Any new allergies since your discharge?: No Dietary orders reviewed?: Yes Do you have support at home?: Yes People in Home: parent(s)  Medications Reviewed Today: Medications Reviewed Today     Reviewed by Karena Addison, LPN (Licensed Practical Nurse) on 05/11/23 at [phone]  Med List Status: <None>   Medication Order Taking? Sig Documenting Provider Last Dose Status Informant  Accu-Chek Softclix Lancets lancets [id number] No daily. [provider] Taking Active Self  albuterol (VENTOLIN HFA) 108 (90 Base) MCG/ACT inhaler [id number]  Inhale 1-2 puffs into the lungs every 6 (six) hours as needed for wheezing or shortness of breath. Marinda Elk, MD  Active   Blood Glucose Monitoring Suppl (MM EASY TOUCH GLUCOSE METER) w/Device KIT [id number] No Please check your blood sugar daily, in the morning. Marolyn Haller, MD Taking Active Self  Blood Glucose Monitoring Suppl DEVI [id number]  1 each by Does not apply route 3 (three) times daily. May dispense any manufacturer covered by patient's insurance. Marinda Elk, MD  Active   busPIRone (BUSPAR) 10 MG tablet [id number] No Take 3 tablets (30 mg total) by mouth daily. Marolyn Haller, MD Past Month Active Self  Glucose Blood (BLOOD GLUCOSE TEST STRIPS) STRP [id number]   1 each by Does not apply route 3 (three) times daily. Use as directed to check blood sugar. May dispense any manufacturer covered by patient's insurance and fits patient's device. Marinda Elk, MD  Active   insulin aspart (NOVOLOG) 100 UNIT/ML FlexPen [id number]  Inject 8 Units into the skin 3 (three) times daily with meals. Only take if eating a meal AND Blood Glucose (BG) is 80 or higher. Marinda Elk, MD  Active   insulin glargine (LANTUS) 100 UNIT/ML Solostar Pen [id number]  Inject 70 Units into the skin daily. May substitute as needed per insurance. Marinda Elk, MD  Active   Insulin Pen Needle (PEN NEEDLES) 31G X 5 MM MISC [id number]  1 each by Does not apply route 3 (three) times daily. May dispense any manufacturer covered by patient's insurance. Marinda Elk, MD  Active   Lancet Device MISC [id number]  1 each by Does not apply route 3 (three) times daily. May dispense any manufacturer covered by patient's insurance. Marinda Elk, MD  Active   Lancets MISC [id number]  1 each by Does not apply route 3 (three) times daily. Use as directed to check blood sugar. May dispense any manufacturer covered by patient's insurance and fits patient's device. Marinda Elk, MD  Active   medroxyPROGESTERone (DEPO-PROVERA) 150 MG/ML injection [id number] No Inject 150 mg into the muscle every 3 (three) months. [provider] over 1 month Active Self  norethindrone (AYGESTIN) 5 MG tablet [id number] No Take 10 mg by mouth every evening. [provider] 05/06/2023 Active Self  pregabalin (LYRICA) 50 MG capsule [id number] No Take 1 capsule (50 mg total) by mouth 3 (three) times daily. Marolyn Haller, MD 05/06/2023 Active Self  Home Care and Equipment/Supplies: Were Home Health Services Ordered?: NA Any new equipment or medical supplies ordered?: NA  Functional Questionnaire: Do you need assistance with bathing/showering or dressing?: No Do  you need assistance with meal preparation?: No Do you need assistance with eating?: No Do you have difficulty maintaining continence: No Do you need assistance with getting out of bed/getting out of a chair/moving?: No Do you have difficulty managing or taking your medications?: No  Follow up appointments reviewed: PCP Follow-up appointment confirmed?: Yes Date of PCP follow-up appointment?: 05/19/23 Follow-up Provider: [facility] Setting Follow-up appointment confirmed?: No Reason Specialist Follow-Up Not Confirmed: Patient has Specialist Provider Number and will Call for Appointment Do you need transportation to your follow-up appointment?: No Do you understand care options if your condition(s) worsen?: Yes-patient verbalized understanding    SIGNATURE Karena Addison, LPN [facility] Nurse Health Advisor Direct Dial [phone]

## 2023-05-11 NOTE — Telephone Encounter (Signed)
Rec'd letter from appeals dept. Appeal rec'd and still pending as of 04/28/23. Per [employer], this review could take up to 30 days to complete. For any additional questions, contact Crystal Bradsher at [phone] or submit any information to her. (I have her direct info with me).  I will call to f/u

## 2023-05-12 DIAGNOSIS — N61 Mastitis without abscess: Secondary | ICD-10-CM | POA: Diagnosis not present

## 2023-05-12 DIAGNOSIS — R102 Pelvic and perineal pain: Secondary | ICD-10-CM | POA: Diagnosis not present

## 2023-05-12 DIAGNOSIS — N611 Abscess of the breast and nipple: Secondary | ICD-10-CM | POA: Diagnosis not present

## 2023-05-12 NOTE — Telephone Encounter (Signed)
Pharmacy Patient Advocate Encounter   Received notification that prior authorization for Lantus SoloStar 100UNIT/ML pen-injectors  is required/requested.    [place] submitted [place] [employer] via CoverMyMeds Key/confirmation #/EOC [id number] Status is pending

## 2023-05-12 NOTE — Telephone Encounter (Signed)
Pharmacy Patient Advocate Encounter  Received notification from [facility] that Prior Authorization for Lantus SoloStar 100UNIT/ML pen-injector  has been DENIED because  .Marland Kitchen   PA #/Case ID/Reference #: [id number]

## 2023-05-13 DIAGNOSIS — R42 Dizziness and giddiness: Secondary | ICD-10-CM | POA: Diagnosis not present

## 2023-05-13 DIAGNOSIS — G8929 Other chronic pain: Secondary | ICD-10-CM | POA: Diagnosis not present

## 2023-05-13 DIAGNOSIS — R102 Pelvic and perineal pain: Secondary | ICD-10-CM | POA: Diagnosis not present

## 2023-05-13 DIAGNOSIS — N611 Abscess of the breast and nipple: Secondary | ICD-10-CM | POA: Diagnosis not present

## 2023-05-13 DIAGNOSIS — N644 Mastodynia: Secondary | ICD-10-CM | POA: Diagnosis not present

## 2023-05-15 DIAGNOSIS — F4323 Adjustment disorder with mixed anxiety and depressed mood: Secondary | ICD-10-CM | POA: Diagnosis not present

## 2023-05-16 DIAGNOSIS — G4733 Obstructive sleep apnea (adult) (pediatric): Secondary | ICD-10-CM | POA: Diagnosis not present

## 2023-05-17 ENCOUNTER — Ambulatory Visit
Admission: EM | Admit: 2023-05-17 | Discharge: 2023-05-17 | Disposition: A | Discharge status: Home / Self Care | Payer: BC Managed Care – PPO | Attending: Internal Medicine | Admitting: Internal Medicine

## 2023-05-17 DIAGNOSIS — N611 Abscess of the breast and nipple: Secondary | ICD-10-CM

## 2023-05-17 DIAGNOSIS — R112 Nausea with vomiting, unspecified: Secondary | ICD-10-CM

## 2023-05-17 DIAGNOSIS — Z789 Other specified health status: Secondary | ICD-10-CM

## 2023-05-17 MED ORDER — ONDANSETRON 8 MG PO TBDP
8.0000 mg | ORAL_TABLET | Freq: Three times a day (TID) | ORAL | 0 refills | Status: DC | PRN
Start: 2023-05-17 — End: 2024-09-26

## 2023-05-17 MED ORDER — ONDANSETRON 8 MG PO TBDP
8.0000 mg | ORAL_TABLET | Freq: Once | ORAL | Status: AC
  Administered 2023-05-17: 8 mg via ORAL

## 2023-05-17 MED ORDER — SULFAMETHOXAZOLE-TRIMETHOPRIM 800-160 MG PO TABS
1.0000 | ORAL_TABLET | Freq: Two times a day (BID) | ORAL | 0 refills | Status: AC
Start: 2023-05-17 — End: 2023-05-24

## 2023-05-17 NOTE — Discharge Instructions (Addendum)
Stop Keflex and start Bactrim twice daily for 7 days.  You may take Zofran as needed for nausea and vomiting every 8 hours.  You were given a dose while you are in the clinic.  Please follow-up with your PCP for recheck of your abscess.  Please go to the emergency room if you develop any worsening symptoms.  Hope you feel better soon!

## 2023-05-17 NOTE — ED Provider Notes (Addendum)
[facility]    CSN: [id number] Arrival date & time: 05/17/23  1613      History   Chief Complaint Chief Complaint  Patient presents with   Abdominal Pain    HPI Jade Lopez is a 27 y.o. female presents for evaluation of nausea vomiting and antibiotic intolerance.  Patient was seen 3 days ago at a different urgent care for a breast abscess.  She states she had a needle aspiration and was started on Keflex 4 times daily for 7 days.  She reports since she started that she has been having nausea vomiting and diarrhea.  No fevers or chills.  No URI symptoms.  States she has been treated for abscesses many times in the past.  Does have a history of MRSA.  No other concerns at this time.   Abdominal Pain Associated symptoms: diarrhea, nausea and vomiting     Past Medical History:  Diagnosis Date   Asthma    Autoimmune disease (HCC)    Blood clots in brain 2020   Class 3 obesity (HCC)    Endometriosis    Endometritis    Fibromyalgia    Sleep apnea    Sleep related headaches 02/24/2023   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   T2DM (type 2 diabetes mellitus) (HCC)    TOA (tubo-ovarian abscess)     Patient Active Problem List   Diagnosis Date Noted   Near syncope 05/09/2023   Syncope 05/08/2023   Asthma exacerbation 05/08/2023   URI (upper respiratory infection) 05/08/2023   Fibromyalgia 04/11/2023   Abscess of right breast 04/03/2023   Abdominal wall pain 03/01/2023   Sleep related headaches 02/24/2023   OSA on CPAP 02/24/2023   Sleeps in sitting position due to orthopnea 02/24/2023   Menorrhagia 01/28/2023   Healthcare maintenance 01/28/2023   Chest pain 01/28/2023   Back pain 01/28/2023   Sleep apnea 01/27/2023   Type 2 diabetes mellitus (HCC) 11/28/2022   Lupus (HCC) 11/28/2022   Class 3 obesity (HCC) 11/28/2022   Endometriosis 11/28/2022   Alkaline phosphatase elevation 11/28/2022   Anxiety 11/28/2022   Vitamin D deficiency 09/13/2012   Asthma 08/22/2012     Past Surgical History:  Procedure Laterality Date   CHOLECYSTECTOMY     DILATATION AND CURETTAGE/HYSTEROSCOPY WITH MINERVA     3 total   TONSILLECTOMY     TONSILLECTOMY AND ADENOIDECTOMY      OB History     Gravida  0   Para  0   Term  0   Preterm  0   AB  0   Living  0      SAB  0   IAB  0   Ectopic  0   Multiple  0   Live Births  0            Home Medications    Prior to Admission medications   Medication Sig Start Date End Date Taking? Authorizing Provider  ondansetron (ZOFRAN-ODT) 8 MG disintegrating tablet Take 1 tablet (8 mg total) by mouth every 8 (eight) hours as needed for nausea or vomiting. 05/17/23  Yes [name], NP  sulfamethoxazole-trimethoprim (BACTRIM DS) 800-160 MG tablet Take 1 tablet by mouth 2 (two) times daily for 7 days. 05/17/23 05/24/23 Yes [name], NP  Accu-Chek Softclix Lancets lancets daily. 12/15/22   [provider]  albuterol (VENTOLIN HFA) 108 (90 Base) MCG/ACT inhaler Inhale 1-2 puffs into the lungs every 6 (six) hours as needed for wheezing or  shortness of breath. 05/10/23   Marinda Elk, MD  Blood Glucose Monitoring Suppl (MM EASY TOUCH GLUCOSE METER) w/Device KIT Please check your blood sugar daily, in the morning. 12/08/22   Marolyn Haller, MD  Blood Glucose Monitoring Suppl DEVI 1 each by Does not apply route 3 (three) times daily. May dispense any manufacturer covered by patient's insurance. 05/10/23   Marinda Elk, MD  busPIRone (BUSPAR) 10 MG tablet Take 3 tablets (30 mg total) by mouth daily. 11/25/22   Marolyn Haller, MD  Glucose Blood (BLOOD GLUCOSE TEST STRIPS) STRP 1 each by Does not apply route 3 (three) times daily. Use as directed to check blood sugar. May dispense any manufacturer covered by patient's insurance and fits patient's device. 05/10/23   Marinda Elk, MD  insulin aspart (NOVOLOG) 100 UNIT/ML FlexPen Inject 8 Units into the skin 3 (three) times daily with meals.  Only take if eating a meal AND Blood Glucose (BG) is 80 or higher. 05/10/23   Marinda Elk, MD  insulin glargine (LANTUS) 100 UNIT/ML Solostar Pen Inject 70 Units into the skin daily. May substitute as needed per insurance. 05/10/23   Marinda Elk, MD  Insulin Pen Needle (PEN NEEDLES) 31G X 5 MM MISC 1 each by Does not apply route 3 (three) times daily. May dispense any manufacturer covered by patient's insurance. 05/10/23   Marinda Elk, MD  Lancet Device MISC 1 each by Does not apply route 3 (three) times daily. May dispense any manufacturer covered by patient's insurance. 05/10/23   Marinda Elk, MD  Lancets MISC 1 each by Does not apply route 3 (three) times daily. Use as directed to check blood sugar. May dispense any manufacturer covered by patient's insurance and fits patient's device. 05/10/23   Marinda Elk, MD  medroxyPROGESTERone (DEPO-PROVERA) 150 MG/ML injection Inject 150 mg into the muscle every 3 (three) months.    [provider]  norethindrone (AYGESTIN) 5 MG tablet Take 10 mg by mouth every evening. 12/11/22   [provider]  pregabalin (LYRICA) 50 MG capsule Take 1 capsule (50 mg total) by mouth 3 (three) times daily. 01/12/23   Marolyn Haller, MD    Family History Family History  Problem Relation Age of Onset   Diabetes Mother    Hypertension Mother    Diabetes Father    Hypertension Father     Social History Social History   Tobacco Use   Smoking status: Never   Smokeless tobacco: Never  Vaping Use   Vaping Use: Never used  Substance Use Topics   Alcohol use: Yes    Comment: occ   Drug use: No     Allergies   Clindamycin/lincomycin, Gabapentin, Other, Chlorpheniramine-phenylephrine, and Pregabalin   Review of Systems Review of Systems  Gastrointestinal:  Positive for diarrhea, nausea and vomiting.     Physical Exam Triage Vital Signs ED Triage Vitals  Enc Vitals Group     BP 05/17/23 1711 113/84      Pulse Rate 05/17/23 1711 (!) 117     Resp 05/17/23 1711 16     Temp 05/17/23 1711 98.8 F (37.1 C)     Temp Source 05/17/23 1711 Oral     SpO2 05/17/23 1711 98 %     Weight --      Height --      Head Circumference --      Peak Flow --      Pain Score 05/17/23 1714 2  Pain Loc --      Pain Edu? --      Excl. in GC? --    No data found.  Updated Vital Signs BP 113/84 (BP Location: Right Wrist)   Pulse (!) 117   Temp 98.8 F (37.1 C) (Oral)   Resp 16   LMP 05/07/2023 (Exact Date)   SpO2 98%   Visual Acuity Right Eye Distance:   Left Eye Distance:   Bilateral Distance:    Right Eye Near:   Left Eye Near:    Bilateral Near:     Physical Exam Vitals and nursing note reviewed.  Constitutional:      Appearance: Normal appearance.  HENT:     Head: Normocephalic and atraumatic.  Eyes:     Pupils: Pupils are equal, round, and reactive to light.  Cardiovascular:     Rate and Rhythm: Tachycardia present.     Comments: Mildly tachycardic at 117, likely due to deconditioning Pulmonary:     Effort: Pulmonary effort is normal.  Skin:    General: Skin is warm and dry.  Neurological:     General: No focal deficit present.     Mental Status: She is alert and oriented to person, place, and time.  Psychiatric:        Mood and Affect: Mood normal.        Behavior: Behavior normal.      UC Treatments / Results  Labs (all labs ordered are listed, but only abnormal results are displayed) Labs Reviewed - No data to display  tains abnormal data Comprehensive Metabolic Panel Order: [id number] Component Ref Range & Units 5 d ago  Sodium 136 - 145 mmol/L 136  Potassium 3.5 - 5.1 mmol/L 4.6  Comment: NO VISIBLE HEMOLYSIS  Chloride 98 - 107 mmol/L 103  CO2 21 - 31 mmol/L 23  Anion Gap 6 - 14 mmol/L 10  Glucose, Random 70 - 99 mg/dL 409 High   Blood Urea Nitrogen (BUN) 7 - 25 mg/dL 11  Creatinine 8.11 - 9.14 mg/dL 7.82  eGFR >95 AO/ZHY/8.65H8 >90  Comment: GFR  estimated by CKD-EPI equations(NKF 2021).  "Recommend confirmation of Cr-based eGFR by using Cys-based eGFR and other filtration markers (if applicable) in complex cases and clinical decision-making, as needed."  Albumin 3.5 - 5.7 g/dL 4.0  Total Protein 6.4 - 8.9 g/dL 7.1  Bilirubin, Total 0.3 - 1.0 mg/dL 0.3  Alkaline Phosphatase (ALP) 34 - 104 U/L 95  Aspartate Aminotransferase (AST) 13 - 39 U/L 13  Alanine Aminotransferase (ALT) 7 - 52 U/L 13  Calcium 8.6 - 10.3 mg/dL 9.4  BUN/Creatinine Ratio   Comment: Creatinine is normal, ratio is not clinically indicated.  Resulting Agency [facility] [place] [facility](CLIA# [id number])   Specimen Collected: 05/12/23 18:19   Performed by: [facility](CLIA# [id number]) Last Resulted: 05/12/23 19:43  Received From: [facility]  Result Received: 05/17/23 09:45    EKG   Radiology No results found.  Procedures Procedures (including critical care time)  Medications Ordered in UC Medications  ondansetron (ZOFRAN-ODT) disintegrating tablet 8 mg (8 mg Oral Given 05/17/23 1731)    Initial Impression / Assessment and Plan / UC Course  I have reviewed the triage vital signs and the nursing notes.  Pertinent labs & imaging results that were available during my care of the patient were reviewed by me and considered in my medical decision making (see chart for details).     Reviewed exam and  symptoms with patient.  Discussed likely intolerance to Keflex.  States she has been on Bactrim in the past and tolerated it much better.  Will stop Keflex and start Bactrim twice daily for 7 days.  Rx Zofran sent to pharmacy and patient given a dose in clinic for the nausea vomiting.  Discussed maintaining hydration.  Follow-up with PCP if symptoms do not improve.  ER precautions reviewed and patient verbalized understanding Final Clinical Impressions(s) / UC Diagnoses   Final diagnoses:  Nausea and vomiting,  unspecified vomiting type  Antibiotic drug intolerance  Breast abscess     Discharge Instructions      Keflex and will start Bactrim twice daily for 7 days.  You may take Zofran as needed for nausea and vomiting every 8 hours.  You were given a dose while you are in the clinic.  Please follow-up with your PCP for recheck of your abscess.  Please go to the emergency room if you develop any worsening symptoms.  Hope you feel better soon!   ED Prescriptions     Medication Sig Dispense Auth. Provider   sulfamethoxazole-trimethoprim (BACTRIM DS) 800-160 MG tablet Take 1 tablet by mouth 2 (two) times daily for 7 days. 14 tablet [name], NP   ondansetron (ZOFRAN-ODT) 8 MG disintegrating tablet Take 1 tablet (8 mg total) by mouth every 8 (eight) hours as needed for nausea or vomiting. 20 tablet [name], NP      PDMP not reviewed this encounter.   [name], NP 05/17/23 1735    [name], NP 05/17/23 1736

## 2023-05-17 NOTE — ED Triage Notes (Signed)
Pt presents to UC w/ c/o diarrhea, nausea x3 days. Pt states upper abdominal pain started this morning along with vomiting and hot flashes. Pt has been taking keflex since 05/14/23.

## 2023-05-18 NOTE — Telephone Encounter (Signed)
Prior auth appeal for brand Lyrica denied.   Per [employer]: This medication is not on formulary and is covered when the member is being treated for a seizure-related or refractory psychiatric disorder. In this case member is being treated for fibromyalgia.

## 2023-05-19 ENCOUNTER — Ambulatory Visit: Payer: BC Managed Care – PPO | Admitting: Student

## 2023-05-19 VITALS — BP 116/67 | HR 101 | Temp 98.2°F | Ht 64.5 in | Wt 391.0 lb

## 2023-05-19 DIAGNOSIS — M797 Fibromyalgia: Secondary | ICD-10-CM

## 2023-05-19 DIAGNOSIS — M329 Systemic lupus erythematosus, unspecified: Secondary | ICD-10-CM

## 2023-05-19 DIAGNOSIS — F419 Anxiety disorder, unspecified: Secondary | ICD-10-CM

## 2023-05-19 DIAGNOSIS — E119 Type 2 diabetes mellitus without complications: Secondary | ICD-10-CM

## 2023-05-19 DIAGNOSIS — Z87898 Personal history of other specified conditions: Secondary | ICD-10-CM

## 2023-05-19 DIAGNOSIS — N611 Abscess of the breast and nipple: Secondary | ICD-10-CM

## 2023-05-19 DIAGNOSIS — J45909 Unspecified asthma, uncomplicated: Secondary | ICD-10-CM

## 2023-05-19 DIAGNOSIS — Z0000 Encounter for general adult medical examination without abnormal findings: Secondary | ICD-10-CM

## 2023-05-19 DIAGNOSIS — Z7985 Long-term (current) use of injectable non-insulin antidiabetic drugs: Secondary | ICD-10-CM

## 2023-05-19 MED ORDER — PREGABALIN 50 MG PO CAPS
50.0000 mg | ORAL_CAPSULE | Freq: Three times a day (TID) | ORAL | 3 refills | Status: DC
Start: 2023-05-19 — End: 2024-04-27

## 2023-05-19 MED ORDER — SEMAGLUTIDE(0.25 OR 0.5MG/DOS) 2 MG/3ML ~~LOC~~ SOPN
0.5000 mg | PEN_INJECTOR | Freq: Two times a day (BID) | SUBCUTANEOUS | 3 refills | Status: DC | PRN
Start: 2023-05-19 — End: 2023-06-21

## 2023-05-19 MED ORDER — BUDESONIDE-FORMOTEROL FUMARATE 80-4.5 MCG/ACT IN AERO: 2.0000 | INHALATION_SPRAY | Freq: Two times a day (BID) | RESPIRATORY_TRACT | 12 refills | Status: AC | PRN

## 2023-05-19 NOTE — Assessment & Plan Note (Signed)
Patient reported having nausea and vomiting, went to the ED and was prescribed Zofran to be taken as needed, says Zofran has been helping and the nausea and vomiting has decreased some. Plan - Continue Zofran as needed - Follow-up with [place] if nausea and vomiting gets worse

## 2023-05-19 NOTE — Assessment & Plan Note (Addendum)
History of asthma, currently on albuterol.  Patient reported using albuterol 3-4 times a day.  Will consider switching patient from albuterol to LABA/ICS due to the frequency of albuterol use in the past week.  Asthma triggers including heat ,exertion and carpeted floors,dirt and allergies ,adequately discussed with the patient Plan   - Prescribed Symbicort 80-4.5 MCG/ACT    - Follow-up in 2 to 3 months

## 2023-05-19 NOTE — Progress Notes (Addendum)
CC: Recent hospital follow up for abdominal pain nausea and vomiting and right breast abscess  HPI:  [name] is a 27 y.o. female living with a history stated below and presents today for recent hospital follow up for abdominal pain nausea and vomiting and right breast abscess. Please see problem based assessment and plan for additional details.  Past Medical History:  Diagnosis Date   Asthma    Autoimmune disease (HCC)    Blood clots in brain 2020   Class 3 obesity (HCC)    Endometriosis    Endometritis    Fibromyalgia    Sleep apnea    Sleep related headaches 02/24/2023   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   T2DM (type 2 diabetes mellitus) (HCC)    TOA (tubo-ovarian abscess)     Current Outpatient Medications on File Prior to Visit  Medication Sig Dispense Refill   Accu-Chek Softclix Lancets lancets daily.     albuterol (VENTOLIN HFA) 108 (90 Base) MCG/ACT inhaler Inhale 1-2 puffs into the lungs every 6 (six) hours as needed for wheezing or shortness of breath. 1 each 1   Blood Glucose Monitoring Suppl (MM EASY TOUCH GLUCOSE METER) w/Device KIT Please check your blood sugar daily, in the morning. 1 kit 0   Blood Glucose Monitoring Suppl DEVI 1 each by Does not apply route 3 (three) times daily. May dispense any manufacturer covered by patient's insurance. 1 each 0   busPIRone (BUSPAR) 10 MG tablet Take 3 tablets (30 mg total) by mouth daily. 30 tablet 5   Glucose Blood (BLOOD GLUCOSE TEST STRIPS) STRP 1 each by Does not apply route 3 (three) times daily. Use as directed to check blood sugar. May dispense any manufacturer covered by patient's insurance and fits patient's device. 100 strip 0   Lancet Device MISC 1 each by Does not apply route 3 (three) times daily. May dispense any manufacturer covered by patient's insurance. 1 each 0   Lancets MISC 1 each by Does not apply route 3 (three) times daily. Use as directed to check blood sugar. May dispense any manufacturer covered by  patient's insurance and fits patient's device. 100 each 0   medroxyPROGESTERone (DEPO-PROVERA) 150 MG/ML injection Inject 150 mg into the muscle every 3 (three) months.     norethindrone (AYGESTIN) 5 MG tablet Take 10 mg by mouth every evening.     ondansetron (ZOFRAN-ODT) 8 MG disintegrating tablet Take 1 tablet (8 mg total) by mouth every 8 (eight) hours as needed for nausea or vomiting. 20 tablet 0   sulfamethoxazole-trimethoprim (BACTRIM DS) 800-160 MG tablet Take 1 tablet by mouth 2 (two) times daily for 7 days. 14 tablet 0   No current facility-administered medications on file prior to visit.    Family History  Problem Relation Age of Onset   Diabetes Mother    Hypertension Mother    Diabetes Father    Hypertension Father     Social History   Socioeconomic History   Marital status: Single    Spouse name: Not on file   Number of children: Not on file   Years of education: Not on file   Highest education level: Not on file  Occupational History   Not on file  Tobacco Use   Smoking status: Never   Smokeless tobacco: Never  Vaping Use   Vaping Use: Never used  Substance and Sexual Activity   Alcohol use: Yes    Comment: occ   Drug use: No   Sexual activity: Not  Currently    Birth control/protection: Injection, Pill  Other Topics Concern   Not on file  Social History Narrative   Not on file   Social Determinants of Health   Financial Resource Strain: Not on file  Food Insecurity: Food Insecurity Present (05/08/2023)   Hunger Vital Sign    Worried About Running Out of Food in the Last Year: Sometimes true    Ran Out of Food in the Last Year: Sometimes true  Transportation Needs: No Transportation Needs (05/08/2023)   PRAPARE - Administrator, Civil Service (Medical): No    Lack of Transportation (Non-Medical): No  Physical Activity: Not on file  Stress: Not on file  Social Connections: Socially Isolated (12/14/2022)   Social Connection and Isolation  Panel [NHANES]    Frequency of Communication with Friends and Family: More than three times a week    Frequency of Social Gatherings with Friends and Family: More than three times a week    Attends Religious Services: Never    Database administrator or Organizations: No    Attends Banker Meetings: Never    Marital Status: Never married  Intimate Partner Violence: Not At Risk (05/08/2023)   Humiliation, Afraid, Rape, and Kick questionnaire    Fear of Current or Ex-Partner: No    Emotionally Abused: No    Physically Abused: No    Sexually Abused: No    Review of Systems: ROS negative except for what is noted on the assessment and plan.  Vitals:   05/19/23 1013  BP: 116/67  Pulse: (!) 101  Temp: 98.2 F (36.8 C)  TempSrc: Oral  SpO2: 99%  Weight: (!) 391 lb (177.4 kg)  Height: 5' 4.5" (1.638 m)    Physical Exam: Constitutional: Morbidly obese  Cardiovascular: regular rate and rhythm, no m/r/g Pulmonary/Chest: normal work of breathing on room air, lungs clear to auscultation bilaterally MSK: Foot look clean and no sign of fungi infection Skin: warm and dry,no rash Psych: normal mood and behavior  Assessment & Plan:   Asthma History of asthma, currently on albuterol.  Patient reported using albuterol 3-4 times a day.  Will consider switching patient from albuterol to LABA/ICS due to the frequency of albuterol use in the past week.  Asthma triggers including heat ,exertion and carpeted floors,dirt and allergies ,adequately discussed with the patient Plan   - Prescribed Symbicort 80-4.5 MCG/ACT    - Follow-up in 2 to 3 months  Type 2 diabetes mellitus (HCC) Patient has a history of type 2 diabetes currently being managed with 0.25 of Ozempic, says she was previously prescribed insulin but insurance did not cover so so she was not able to pick it up.  Last hemoglobin A1c done on June 30 was 7.7 down from 8.35 months prior. will consider titrating Ozempic up to 0.5  in the next 2 weeks.  Patient denies any vision changes, polydipsia or polyuria at this time. 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  Reports she has been in and out of the hospital for the same issue.  Current admission for right breast abscess was a week ago, says the  abscess was aspirated and was given 7 days of Bactrim.  Abscess site looks dry and no pus discharge at this time. Plan - Continue Bactrim for 5 days ,last dose will be on 15 July   History of nausea and vomiting Patient reported having nausea and vomiting, went to the ED and was prescribed Zofran to be taken as needed, says Zofran has been helping and the nausea and vomiting has decreased some. Plan - Continue Zofran as needed - Follow-up with [place] if nausea and vomiting gets worse  Healthcare maintenance Patient is a 27 year old female, says had a recent Pap smear done that was normal.  HPV vaccine discussed with the patient, says she thinks she has taken that already ,awaiting official vaccination record.  Safe sex practices, safe driving , drug and alcohol use and intimate partner violence adequately discussed with the patient.  Anxiety Hx of anxiety ,currently on buspirone 10 mg,reports its helping her and she has no other concerns at this time. Patient also express concerns of change in mood,lost of interest in doing her regular activities and feels down concerning for depressed mood. Patient will benefit from Lyrica due to her depressed mood in the setting of fibromyalgia  Plan :     - Prescribe Pregablin 50mg     - Continue Buspirone 10 mg     Patient seen with Dr. Dale [place] [facility],  M.D [facility] Phone: [phone] Date 05/19/2023 Time 1:42 PM

## 2023-05-19 NOTE — Assessment & Plan Note (Addendum)
Patient is a 27 year old female, says had a recent Pap smear done that was normal.  HPV vaccine discussed with the patient, says she thinks she has taken that already ,awaiting official vaccination record.  Safe sex practices, safe driving , drug and alcohol use and intimate partner violence adequately discussed with the patient.

## 2023-05-19 NOTE — Assessment & Plan Note (Addendum)
Hx of anxiety ,currently on buspirone 10 mg,reports its helping her and she has no other concerns at this time. Patient also express concerns of change in mood,lost of interest in doing her regular activities and feels down concerning for depressed mood. Patient will benefit from Lyrica due to her depressed mood in the setting of fibromyalgia  Plan :     - Prescribe Pregablin 50mg     - Continue Buspirone 10 mg

## 2023-05-19 NOTE — Patient Instructions (Signed)
Thank you, Jade Lopez for allowing [place] to provide your care today. Today we discussed your general health, asthma.sleep apnea,abdominal pain, chronic abscess and anxiety. - I will send new prescription for your ozempic today - I will also send in Symbicort for the asthma  - Continue using  CPAP as needed  - Continue  bactrim until 7/15 - Follow up with me in 3 month   I have ordered the following labs for you:  Lab Orders  No laboratory test(s) ordered today     Referrals ordered today:   Referral Orders  No referral(s) requested today     I have ordered the following medication/changed the following medications:   Stop the following medications: There are no discontinued medications.   Start the following medications: Meds ordered this encounter  Medications   Semaglutide,0.25 or 0.5MG /DOS, 2 MG/3ML SOPN    Sig: Inject 0.5 mg into the skin 2 (two) times daily as needed.    Dispense:  3 mL    Refill:  3   budesonide-formoterol (SYMBICORT) 80-4.5 MCG/ACT inhaler    Sig: Inhale 2 puffs into the lungs 2 (two) times daily as needed.    Dispense:  1 each    Refill:  12     Follow up: 2-3 months   Remember:  Should you have any questions or concerns please call the internal medicine clinic at [phone].    Kathleen Lime, M.D [facility]

## 2023-05-19 NOTE — Telephone Encounter (Signed)
Lyrica re-prescribed for pt's anxiety and depression. Please try the PA again . Thank you

## 2023-05-19 NOTE — Assessment & Plan Note (Addendum)
Patient has a history of chronic abscess on the right breast since age 27.  Reports she has been in and out of the hospital for the same issue.  Current admission for right breast abscess was a week ago, says the abscess was aspirated and was given 7 days of Bactrim.  Abscess site looks dry and no pus discharge at this time. Plan - Continue Bactrim for 5 days ,last dose will be on 15 July

## 2023-05-19 NOTE — Assessment & Plan Note (Signed)
Patient has a history of type 2 diabetes currently being managed with 0.25 of Ozempic, says she was previously prescribed insulin but insurance did not cover so so she was not able to pick it up.  Last hemoglobin A1c done on June 30 was 7.7 down from 8.35 months prior. will consider titrating Ozempic up to 0.5 in the next 2 weeks.  Patient denies any vision changes, polydipsia or polyuria at this time. Plan  -Continue 0.25 Ozempic for the next 2 weeks and titrate up to 0.5 afterwards - Encourage to continue glucose monitoring monitoring - Check hemoglobin A1c A1c next visit ( in 3 months) - Counsel on weight loss, DASH diet and adherence to current regimen h

## 2023-05-19 NOTE — Telephone Encounter (Signed)
Spoke with patient she also has PTSD, Anxiety and depression.  This will need to be added to the patient's list of problems.  Other medications tried have given her Tachycardia. PA sent denied as there were no psychiatric problems.

## 2023-05-19 NOTE — Addendum Note (Signed)
Addended by: Kathleen Lime on: 05/19/2023 01:43 PM   Modules accepted: Orders

## 2023-05-25 NOTE — Progress Notes (Signed)
 Internal Medicine Clinic Attending  I saw and evaluated the patient.  I personally confirmed the key portions of the history and exam documented by the resident  and I reviewed pertinent patient test results.  The assessment, diagnosis, and plan were formulated together and I agree with the documentation in the resident's note.  

## 2023-05-26 ENCOUNTER — Ambulatory Visit: Payer: BC Managed Care – PPO | Admitting: Obstetrics & Gynecology

## 2023-05-29 DIAGNOSIS — F4323 Adjustment disorder with mixed anxiety and depressed mood: Secondary | ICD-10-CM | POA: Diagnosis not present

## 2023-06-01 ENCOUNTER — Other Ambulatory Visit (HOSPITAL_COMMUNITY): Payer: Self-pay

## 2023-06-01 NOTE — Telephone Encounter (Signed)
Pharmacy Patient Advocate Encounter   Received notification from Physician's Office that prior authorization for LYRICA is required/requested.   The patient is insured through [facility] .   Per test claim: PA started via CoverMyMeds. KEY [id number] . Waiting for clinical questions to populate.

## 2023-06-03 ENCOUNTER — Telehealth: Payer: Self-pay

## 2023-06-03 NOTE — Telephone Encounter (Signed)
Pharmacy Patient Advocate Encounter   Received notification from Physician's Office that prior authorization for Lyrica is required/requested.   PA required; PA submitted to [employer] via CoverMyMeds Key/confirmation #/[id number] Status is pending

## 2023-06-05 DIAGNOSIS — F4323 Adjustment disorder with mixed anxiety and depressed mood: Secondary | ICD-10-CM | POA: Diagnosis not present

## 2023-06-08 NOTE — Telephone Encounter (Signed)
Pharmacy Patient Advocate Encounter  Received notification from [facility] that Prior Authorization for [facility] has been CANCELLED due to; previous denial & appeal denial. PA request can be resubmitted 180 days after the original denial date (05/07/23).   Patient will need to contact her insurance to do a possible 2nd level appeal (this information is in her denial letter, or she can contact [employer] on steps to complete this).

## 2023-06-10 ENCOUNTER — Encounter: Payer: Self-pay | Admitting: Adult Health

## 2023-06-10 ENCOUNTER — Ambulatory Visit (INDEPENDENT_AMBULATORY_CARE_PROVIDER_SITE_OTHER): Payer: BC Managed Care – PPO | Admitting: Adult Health

## 2023-06-10 ENCOUNTER — Telehealth: Payer: Self-pay | Admitting: *Deleted

## 2023-06-10 VITALS — BP 112/66 | HR 94 | Ht 64.0 in | Wt 394.8 lb

## 2023-06-10 DIAGNOSIS — G4733 Obstructive sleep apnea (adult) (pediatric): Secondary | ICD-10-CM

## 2023-06-10 DIAGNOSIS — R5383 Other fatigue: Secondary | ICD-10-CM

## 2023-06-10 DIAGNOSIS — R519 Headache, unspecified: Secondary | ICD-10-CM | POA: Diagnosis not present

## 2023-06-10 NOTE — Patient Instructions (Signed)
Continue using CPAP nightly and greater than 4 hours each night Overnight pulse oximetry  If your symptoms worsen or you develop new symptoms please let us know.

## 2023-06-10 NOTE — Telephone Encounter (Signed)
Sent order to [facility] today

## 2023-06-10 NOTE — Progress Notes (Signed)
PATIENT: Jade Lopez DOB: [birth year]  REASON FOR VISIT: follow up HISTORY FROM: patient PRIMARY NEUROLOGIST: Dr. Vickey Huger  Chief Complaint  Patient presents with   Follow-up    Pt in 5 Pt here for CPAP f/u Pt states increased fatigued and headaches in am . Pt states she feels better when sleeping without CPAP machine Pt states she may need a pressure change      HISTORY OF PRESENT ILLNESS: Today 06/10/23:  Jade Lopez is a 27 y.o. female with a history of OSA on CPAP. Returns today for follow-up. Cpap is working well. She still feels tired throughout the day. Wakes up with a headache approximately twice a week. Goes to bed around 9-10. Usually wakes up around 7:00-7:30 for work. No TV in bed. Does tend to drink Caffeine throughout the day. She goes to the pool once a week for exercise. Tries to watch her diet. Has fibromyalgia and lupus     HISTORY [facility]) The patient had the first sleep study in the year 2010 in [place], [place],  with a result of OSA , a follow up study was done in [place]. Current settings ramping from 8-15 cm water. Her machine is 27 years OLD !!!!   Sleep relevant medical history: Sleepiness, fatigue, Morbid obesity, no Nocturia, had a Tonsillectomy, no cervical spine surgery. She reports she chokes often after drink or food intake. History of GERD.     Family medical /sleep history:  Other family member on CPAP with OSA, insomnia, sleep walkers.    Social history:  Patient is working as a Sports coach at [employer] and lives in a household with sister and brother - in Social worker. One cat.  The patient currently works in daytime/ but used to work in shifts( Chief Technology Officer,) until 2022   Tobacco use; none .  ETOH use ; yes, 2/ month,  Caffeine intake in form of Coffee( 4/ day ) Soda( /) Tea ( /) or energy drinks Exercise in form of swimming.       Sleep habits are as follows: The patient's dinner time is between 8 PM. The patient  goes to bed at 10 PM and continues to sleep for 5 hours, wakes and goes to sleep again for a total 7-8 .   The preferred sleep position is dictated by orthopnoea , with the support of 10 pillows.  Dreams are reportedly rare/ infrequent.  The patient wakes up with an alarm. Multiple alarms at 6, 7.30 and 7.45,  7. 30-   AM is the usual rise time. She reports not feeling refreshed or restored in AM, with symptoms such as dry mouth, morning headaches, and residual fatigue. She has often migraines in AM, often so severe that it leads to ED visits.  Naps are taken infrequently, they are less refreshing than nocturnal sleep.   REVIEW OF SYSTEMS: Out of a complete 14 system review of symptoms, the patient complains only of the following symptoms, and all other reviewed systems are negative.  FSS ESS  ALLERGIES: Allergies  Allergen Reactions   Clindamycin/Lincomycin Other (See Comments), Hives, Palpitations, Rash and Swelling    Other Reaction(s): Racing heart beat   Gabapentin Hives, Nausea And Vomiting and Palpitations    Other Reaction(s): Unknown   Other Anaphylaxis    Allergic to Rynatan cough syrup.   Chlorpheniramine-Phenylephrine Nausea And Vomiting   Pregabalin Rash    Allergic to the generic     HOME MEDICATIONS: Outpatient Medications Prior to  Visit  Medication Sig Dispense Refill   Accu-Chek Softclix Lancets lancets daily.     albuterol (VENTOLIN HFA) 108 (90 Base) MCG/ACT inhaler Inhale 1-2 puffs into the lungs every 6 (six) hours as needed for wheezing or shortness of breath. 1 each 1   Blood Glucose Monitoring Suppl (MM EASY TOUCH GLUCOSE METER) w/Device KIT Please check your blood sugar daily, in the morning. 1 kit 0   Blood Glucose Monitoring Suppl DEVI 1 each by Does not apply route 3 (three) times daily. May dispense any manufacturer covered by patient's insurance. 1 each 0   budesonide-formoterol (SYMBICORT) 80-4.5 MCG/ACT inhaler Inhale 2 puffs into the lungs 2 (two)  times daily as needed. 1 each 12   busPIRone (BUSPAR) 10 MG tablet Take 3 tablets (30 mg total) by mouth daily. 30 tablet 5   Glucose Blood (BLOOD GLUCOSE TEST STRIPS) STRP 1 each by Does not apply route 3 (three) times daily. Use as directed to check blood sugar. May dispense any manufacturer covered by patient's insurance and fits patient's device. 100 strip 0   Lancet Device MISC 1 each by Does not apply route 3 (three) times daily. May dispense any manufacturer covered by patient's insurance. 1 each 0   Lancets MISC 1 each by Does not apply route 3 (three) times daily. Use as directed to check blood sugar. May dispense any manufacturer covered by patient's insurance and fits patient's device. 100 each 0   medroxyPROGESTERone (DEPO-PROVERA) 150 MG/ML injection Inject 150 mg into the muscle every 3 (three) months.     norethindrone (AYGESTIN) 5 MG tablet Take 10 mg by mouth every evening.     ondansetron (ZOFRAN-ODT) 8 MG disintegrating tablet Take 1 tablet (8 mg total) by mouth every 8 (eight) hours as needed for nausea or vomiting. 20 tablet 0   pregabalin (LYRICA) 50 MG capsule Take 1 capsule (50 mg total) by mouth 3 (three) times daily. 90 capsule 3   Semaglutide,0.25 or 0.5MG /DOS, 2 MG/3ML SOPN Inject 0.5 mg into the skin 2 (two) times daily as needed. 3 mL 3   No facility-administered medications prior to visit.    PAST MEDICAL HISTORY: Past Medical History:  Diagnosis Date   Asthma    Autoimmune disease (HCC)    Blood clots in brain 2020   Class 3 obesity (HCC)    Endometriosis    Endometritis    Fibromyalgia    Sleep apnea    Sleep related headaches 02/24/2023   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   T2DM (type 2 diabetes mellitus) (HCC)    TOA (tubo-ovarian abscess)     PAST SURGICAL HISTORY: Past Surgical History:  Procedure Laterality Date   CHOLECYSTECTOMY     DILATATION AND CURETTAGE/HYSTEROSCOPY WITH MINERVA     3 total   TONSILLECTOMY     TONSILLECTOMY AND ADENOIDECTOMY       FAMILY HISTORY: Family History  Problem Relation Age of Onset   Diabetes Mother    Hypertension Mother    Diabetes Father    Hypertension Father     SOCIAL HISTORY: Social History   Socioeconomic History   Marital status: Single    Spouse name: Not on file   Number of children: Not on file   Years of education: Not on file   Highest education level: Not on file  Occupational History   Not on file  Tobacco Use   Smoking status: Never   Smokeless tobacco: Never  Vaping Use   Vaping status: Never Used  Substance and Sexual Activity   Alcohol use: Yes    Comment: occ   Drug use: No   Sexual activity: Not Currently    Birth control/protection: Injection, Pill  Other Topics Concern   Not on file  Social History Narrative   Not on file   Social Determinants of Health   Financial Resource Strain: Not on file  Food Insecurity: Food Insecurity Present (05/08/2023)   Hunger Vital Sign    Worried About Running Out of Food in the Last Year: Sometimes true    Ran Out of Food in the Last Year: Sometimes true  Transportation Needs: No Transportation Needs (05/08/2023)   PRAPARE - Administrator, Civil Service (Medical): No    Lack of Transportation (Non-Medical): No  Physical Activity: Not on file  Stress: Not on file  Social Connections: Socially Isolated (12/14/2022)   Social Connection and Isolation Panel [NHANES]    Frequency of Communication with Friends and Family: More than three times a week    Frequency of Social Gatherings with Friends and Family: More than three times a week    Attends Religious Services: Never    Database administrator or Organizations: No    Attends Banker Meetings: Never    Marital Status: Never married  Intimate Partner Violence: Not At Risk (05/08/2023)   Humiliation, Afraid, Rape, and Kick questionnaire    Fear of Current or Ex-Partner: No    Emotionally Abused: No    Physically Abused: No    Sexually Abused: No       PHYSICAL EXAM  Vitals:   06/10/23 1050  BP: 112/66  Pulse: 94  Weight: (!) 394 lb 12.8 oz (179.1 kg)  Height: 5\' 4"  (1.626 m)   Body mass index is 67.77 kg/m.  Generalized: Well developed, in no acute distress  Chest: Lungs clear to auscultation bilaterally  Neurological examination  Mentation: Alert oriented to time, place, history taking. Follows all commands speech and language fluent Cranial nerve II-XII: facial symmetry noted   DIAGNOSTIC DATA (LABS, IMAGING, TESTING) - I reviewed patient records, labs, notes, testing and imaging myself where available.  Lab Results  Component Value Date   WBC 14.4 (H) 05/09/2023   HGB 13.1 05/09/2023   HCT 43.1 05/09/2023   MCV 82.9 05/09/2023   PLT 341 05/09/2023      Component Value Date/Time   NA 133 (L) 05/09/2023 0709   NA 141 11/25/2022 1149   K 3.9 05/09/2023 0709   CL 103 05/09/2023 0709   CO2 20 (L) 05/09/2023 0709   GLUCOSE 268 (H) 05/09/2023 0709   BUN 10 05/09/2023 0709   BUN 7 11/25/2022 1149   CREATININE 0.69 05/09/2023 0709   CALCIUM 8.5 (L) 05/09/2023 0709   PROT 7.1 05/09/2023 0709   PROT 6.6 11/25/2022 1149   ALBUMIN 3.3 (L) 05/09/2023 0709   ALBUMIN 3.7 (L) 11/25/2022 1149   AST 11 (L) 05/09/2023 0709   ALT 16 05/09/2023 0709   ALKPHOS 92 05/09/2023 0709   BILITOT 0.4 05/09/2023 0709   BILITOT <0.2 11/25/2022 1149   GFRNONAA >60 05/09/2023 0709   Lab Results  Component Value Date   CHOL 166 11/30/2022   HDL 51 11/30/2022   LDLCALC 96 11/30/2022   TRIG 103 11/30/2022   CHOLHDL 3.3 11/30/2022   Lab Results  Component Value Date   HGBA1C 7.7 (H) 05/09/2023   Lab Results  Component Value Date   VITAMINB12 483 04/09/2023   Lab  Results  Component Value Date   TSH 2.820 11/30/2022      ASSESSMENT AND PLAN 27 y.o. year old female  has a past medical history of Asthma, Autoimmune disease (HCC), Blood clots in brain (2020), Class 3 obesity (HCC), Endometriosis, Endometritis,  Fibromyalgia, Sleep apnea, Sleep related headaches (02/24/2023), Super obesity (02/24/2023), T2DM (type 2 diabetes mellitus) (HCC), and TOA (tubo-ovarian abscess). here with:  OSA on CPAP Morning headaches 3.   Fatigue   - CPAP compliance excellent - Good treatment of AHI  - Encourage patient to use CPAP nightly and > 4 hours each night - Overnight pulse oximetry to r/o hypoxemia as a cause of her headaches - F/U in 1 year or sooner if needed  Butch Penny, MSN, NP-C 06/10/2023, 10:43 AM [facility] [address] [place], [place] [address] [phone]

## 2023-06-16 DIAGNOSIS — G4733 Obstructive sleep apnea (adult) (pediatric): Secondary | ICD-10-CM | POA: Diagnosis not present

## 2023-06-18 ENCOUNTER — Encounter: Payer: Self-pay | Admitting: Student

## 2023-06-19 DIAGNOSIS — F4323 Adjustment disorder with mixed anxiety and depressed mood: Secondary | ICD-10-CM | POA: Diagnosis not present

## 2023-06-21 ENCOUNTER — Other Ambulatory Visit (INDEPENDENT_AMBULATORY_CARE_PROVIDER_SITE_OTHER): Payer: BC Managed Care – PPO | Admitting: Student

## 2023-06-21 DIAGNOSIS — G8929 Other chronic pain: Secondary | ICD-10-CM | POA: Diagnosis not present

## 2023-06-21 DIAGNOSIS — E119 Type 2 diabetes mellitus without complications: Secondary | ICD-10-CM

## 2023-06-21 DIAGNOSIS — R102 Pelvic and perineal pain: Secondary | ICD-10-CM | POA: Diagnosis not present

## 2023-06-21 DIAGNOSIS — N644 Mastodynia: Secondary | ICD-10-CM | POA: Diagnosis not present

## 2023-06-21 DIAGNOSIS — Z113 Encounter for screening for infections with a predominantly sexual mode of transmission: Secondary | ICD-10-CM | POA: Diagnosis not present

## 2023-06-21 MED ORDER — TRULICITY 0.75 MG/0.5ML ~~LOC~~ SOAJ
0.7500 mg | SUBCUTANEOUS | 1 refills | Status: DC
Start: 2023-06-21 — End: 2023-08-23

## 2023-06-21 NOTE — Progress Notes (Signed)
Pt messaged. Not tolerating Ozempic due to GI side effects. Can try Trulicity 0.75mg  once weekly for 4-8 weeks and then try increasing in dose. FU in 4 weeks in clinic. Discussed with Dr. Criselda Peaches.

## 2023-06-22 ENCOUNTER — Telehealth: Payer: Self-pay

## 2023-06-22 NOTE — Telephone Encounter (Addendum)
Pa  for pt (  TRULICITY ) came through on cover my meds .Marland Kitchen Submitted with last office notes and labs .Marland Kitchen Awaiting approval or denial

## 2023-06-24 NOTE — Telephone Encounter (Signed)
DECISION :  Outcome   Approved on August 14 by [facility] 2017   Approved.   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

## 2023-06-29 ENCOUNTER — Ambulatory Visit
Admission: RE | Admit: 2023-06-29 | Discharge: 2023-06-29 | Disposition: A | Discharge status: Home / Self Care | Payer: BC Managed Care – PPO | Source: Ambulatory Visit | Attending: Internal Medicine | Admitting: Internal Medicine

## 2023-06-29 ENCOUNTER — Ambulatory Visit: Payer: BC Managed Care – PPO

## 2023-06-29 ENCOUNTER — Other Ambulatory Visit: Payer: Self-pay

## 2023-06-29 VITALS — BP 120/84 | HR 105 | Temp 98.6°F | Resp 18

## 2023-06-29 DIAGNOSIS — R051 Acute cough: Secondary | ICD-10-CM | POA: Insufficient documentation

## 2023-06-29 DIAGNOSIS — B349 Viral infection, unspecified: Secondary | ICD-10-CM | POA: Diagnosis not present

## 2023-06-29 DIAGNOSIS — Z20822 Contact with and (suspected) exposure to covid-19: Secondary | ICD-10-CM | POA: Diagnosis not present

## 2023-06-29 LAB — POCT INFLUENZA A/B
Influenza A, POC: NEGATIVE
Influenza B, POC: NEGATIVE

## 2023-06-29 MED ORDER — PROMETHAZINE-DM 6.25-15 MG/5ML PO SYRP
5.0000 mL | ORAL_SOLUTION | Freq: Four times a day (QID) | ORAL | 0 refills | Status: DC | PRN
Start: 2023-06-29 — End: 2023-08-23

## 2023-06-29 NOTE — Discharge Instructions (Signed)
The clinic will contact you with results of the COVID test done today if positive.  You may take Promethazine DM as needed for cough.  Please note this medication can make you drowsy.  Do not drink alcohol or drive while you are on this medication.  Lots of rest and fluids.  Continue your albuterol inhaler as needed.  Please follow-up with your PCP if your symptoms or not improving.  Please go to the ER for any worsening symptoms.  I hope you feel better soon!

## 2023-06-29 NOTE — ED Triage Notes (Signed)
Pt presents to UC w/ c/o headache, nasal congestion, nausea, diarrhea, fever x3 days. Covid exposure 3 days ago Pt has taken tylenol for symptoms.

## 2023-06-29 NOTE — ED Provider Notes (Signed)
[facility]    CSN: [id number] Arrival date & time: 06/29/23  1143      History   Chief Complaint Chief Complaint  Patient presents with   Fever    I'm not feeling well. My nose has been stuffed up, nausea, headache, fever, back pain (in a new area), body pain, and body weakness. - Entered by patient    HPI Jade Lopez is a 27 y.o. female  presents for evaluation of URI symptoms for 3 days. Patient reports associated symptoms of cough, congestion, headache, nausea, diarrhea, fevers. Denies vomiting, ear pain, sore throat, shortness of breath. Patient does have a hx of asthma.  Has no butyryl inhaler but has not needed to use since symptoms began.  Reports she was exposed to COVID.  States she has had COVID in the past x 4 without hospitalization or complication.  Pt has taken Tylenol OTC for symptoms. Pt has no other concerns at this time.    Fever Associated symptoms: congestion, cough, diarrhea and nausea     Past Medical History:  Diagnosis Date   Asthma    Autoimmune disease (HCC)    Blood clots in brain 2020   Class 3 obesity (HCC)    Endometriosis    Endometritis    Fibromyalgia    Sleep apnea    Sleep related headaches 02/24/2023   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   T2DM (type 2 diabetes mellitus) (HCC)    TOA (tubo-ovarian abscess)     Patient Active Problem List   Diagnosis Date Noted   History of nausea and vomiting 05/19/2023   Near syncope 05/09/2023   Syncope 05/08/2023   Asthma exacerbation 05/08/2023   URI (upper respiratory infection) 05/08/2023   Fibromyalgia 04/11/2023   Abscess of right breast 04/03/2023   Abdominal wall pain 03/01/2023   Sleep related headaches 02/24/2023   OSA on CPAP 02/24/2023   Sleeps in sitting position due to orthopnea 02/24/2023   Menorrhagia 01/28/2023   Healthcare maintenance 01/28/2023   Chest pain 01/28/2023   Back pain 01/28/2023   Type 2 diabetes mellitus (HCC) 11/28/2022   Lupus (HCC) 11/28/2022    Class 3 obesity (HCC) 11/28/2022   Endometriosis 11/28/2022   Anxiety 11/28/2022   Vitamin D deficiency 09/13/2012   Asthma 08/22/2012    Past Surgical History:  Procedure Laterality Date   CHOLECYSTECTOMY     DILATATION AND CURETTAGE/HYSTEROSCOPY WITH MINERVA     3 total   TONSILLECTOMY     TONSILLECTOMY AND ADENOIDECTOMY      OB History     Gravida  0   Para  0   Term  0   Preterm  0   AB  0   Living  0      SAB  0   IAB  0   Ectopic  0   Multiple  0   Live Births  0            Home Medications    Prior to Admission medications   Medication Sig Start Date End Date Taking? Authorizing Provider  promethazine-dextromethorphan (PROMETHAZINE-DM) 6.25-15 MG/5ML syrup Take 5 mLs by mouth 4 (four) times daily as needed for cough. 06/29/23  Yes [name], NP  Accu-Chek Softclix Lancets lancets daily. 12/15/22   [provider]  albuterol (VENTOLIN HFA) 108 (90 Base) MCG/ACT inhaler Inhale 1-2 puffs into the lungs every 6 (six) hours as needed for wheezing or shortness of breath. 05/10/23   Marinda Elk, MD  Blood Glucose Monitoring Suppl (MM EASY TOUCH GLUCOSE METER) w/Device KIT Please check your blood sugar daily, in the morning. 12/08/22   Marolyn Haller, MD  Blood Glucose Monitoring Suppl DEVI 1 each by Does not apply route 3 (three) times daily. May dispense any manufacturer covered by patient's insurance. 05/10/23   Marinda Elk, MD  budesonide-formoterol (SYMBICORT) 80-4.5 MCG/ACT inhaler Inhale 2 puffs into the lungs 2 (two) times daily as needed. 05/19/23   Kathleen Lime, MD  busPIRone (BUSPAR) 10 MG tablet Take 3 tablets (30 mg total) by mouth daily. 11/25/22   Marolyn Haller, MD  Dulaglutide (TRULICITY) 0.75 MG/0.5ML SOPN Inject 0.75 mg into the skin once a week. 06/21/23   Alexander-Savino, [place], MD  Glucose Blood (BLOOD GLUCOSE TEST STRIPS) STRP 1 each by Does not apply route 3 (three) times daily. Use as directed to check  blood sugar. May dispense any manufacturer covered by patient's insurance and fits patient's device. 05/10/23   Marinda Elk, MD  Lancet Device MISC 1 each by Does not apply route 3 (three) times daily. May dispense any manufacturer covered by patient's insurance. 05/10/23   Marinda Elk, MD  Lancets MISC 1 each by Does not apply route 3 (three) times daily. Use as directed to check blood sugar. May dispense any manufacturer covered by patient's insurance and fits patient's device. 05/10/23   Marinda Elk, MD  medroxyPROGESTERone (DEPO-PROVERA) 150 MG/ML injection Inject 150 mg into the muscle every 3 (three) months.    [provider]  norethindrone (AYGESTIN) 5 MG tablet Take 10 mg by mouth every evening. 12/11/22   [provider]  ondansetron (ZOFRAN-ODT) 8 MG disintegrating tablet Take 1 tablet (8 mg total) by mouth every 8 (eight) hours as needed for nausea or vomiting. 05/17/23   [name], NP  Pregabalin (LYRICA PO) Take by mouth.    [provider]  pregabalin (LYRICA) 50 MG capsule Take 1 capsule (50 mg total) by mouth 3 (three) times daily. 05/19/23   Kathleen Lime, MD    Family History Family History  Problem Relation Age of Onset   Diabetes Mother    Hypertension Mother    Diabetes Father    Hypertension Father    Sleep apnea Father     Social History Social History   Tobacco Use   Smoking status: Never   Smokeless tobacco: Never  Vaping Use   Vaping status: Never Used  Substance Use Topics   Alcohol use: Yes    Comment: occ   Drug use: No     Allergies   Clindamycin/lincomycin, Gabapentin, Other, Chlorpheniramine-phenylephrine, and Pregabalin   Review of Systems Review of Systems  Constitutional:  Positive for fever.  HENT:  Positive for congestion.   Respiratory:  Positive for cough.   Gastrointestinal:  Positive for diarrhea and nausea.     Physical Exam Triage Vital Signs ED Triage Vitals  Encounter  Vitals Group     BP 06/29/23 1201 120/84     Systolic BP Percentile --      Diastolic BP Percentile --      Pulse Rate 06/29/23 1201 (!) 105     Resp 06/29/23 1201 18     Temp 06/29/23 1201 98.6 F (37 C)     Temp Source 06/29/23 1201 Oral     SpO2 06/29/23 1201 98 %     Weight --      Height --      Head Circumference --  Peak Flow --      Pain Score 06/29/23 1204 4     Pain Loc --      Pain Education --      Exclude from Growth Chart --    No data found.  Updated Vital Signs BP 120/84 (BP Location: Left Wrist)   Pulse (!) 105   Temp 98.6 F (37 C) (Oral)   Resp 18   SpO2 98%   Visual Acuity Right Eye Distance:   Left Eye Distance:   Bilateral Distance:    Right Eye Near:   Left Eye Near:    Bilateral Near:     Physical Exam Vitals and nursing note reviewed.  Constitutional:      General: She is not in acute distress.    Appearance: She is well-developed. She is not ill-appearing.  HENT:     Head: Normocephalic and atraumatic.     Right Ear: Tympanic membrane and ear canal normal.     Left Ear: Tympanic membrane and ear canal normal.     Nose: Congestion present.     Mouth/Throat:     Mouth: Mucous membranes are moist.     Pharynx: Oropharynx is clear. Uvula midline. No oropharyngeal exudate or posterior oropharyngeal erythema.     Tonsils: No tonsillar exudate or tonsillar abscesses.  Eyes:     Conjunctiva/sclera: Conjunctivae normal.     Pupils: Pupils are equal, round, and reactive to light.  Cardiovascular:     Rate and Rhythm: Normal rate and regular rhythm.     Heart sounds: Normal heart sounds.  Pulmonary:     Effort: Pulmonary effort is normal.     Breath sounds: Normal breath sounds.  Musculoskeletal:     Cervical back: Normal range of motion and neck supple.  Lymphadenopathy:     Cervical: No cervical adenopathy.  Skin:    General: Skin is warm and dry.  Neurological:     General: No focal deficit present.     Mental Status: She is  alert and oriented to person, place, and time.  Psychiatric:        Mood and Affect: Mood normal.        Behavior: Behavior normal.      UC Treatments / Results  Labs (all labs ordered are listed, but only abnormal results are displayed) Labs Reviewed  SARS CORONAVIRUS 2 (TAT 6-24 HRS)  POCT INFLUENZA A/B    EKG   Radiology No results found.  Procedures Procedures (including critical care time)  Medications Ordered in UC Medications - No data to display  Initial Impression / Assessment and Plan / UC Course  I have reviewed the triage vital signs and the nursing notes.  Pertinent labs & imaging results that were available during my care of the patient were reviewed by me and considered in my medical decision making (see chart for details).     Reviewed exam and symptoms with patient.  No red flags.  Negative rapid flu, will send COVID PCR.  Discussed viral illness and symptomatic treatment.  Promethazine DM as needed for cough.  Patient to continue albuterol inhaler.  Given patient's comorbidities she does qualify for Paxlovid if she is positive.  She states she is interested in starting this.  Labs from July 2024 reviewed with normal GFR.  Med review will need to be done to determine if any medication adjustments need to be made if she is prescribed Paxlovid.  She would be able to start that normal dose.  PCP follow-up if symptoms do not improve.  ER precautions reviewed and patient verbalized understanding. Final Clinical Impressions(s) / UC Diagnoses   Final diagnoses:  Acute cough  Exposure to COVID-19 virus  Viral illness     Discharge Instructions      The clinic will contact you with results of the COVID test done today if positive.  You may take Promethazine DM as needed for cough.  Please note this medication can make you drowsy.  Do not drink alcohol or drive while you are on this medication.  Lots of rest and fluids.  Continue your albuterol inhaler as  needed.  Please follow-up with your PCP if your symptoms or not improving.  Please go to the ER for any worsening symptoms.  I hope you feel better soon!     ED Prescriptions     Medication Sig Dispense Auth. Provider   promethazine-dextromethorphan (PROMETHAZINE-DM) 6.25-15 MG/5ML syrup Take 5 mLs by mouth 4 (four) times daily as needed for cough. 118 mL [name], NP      PDMP not reviewed this encounter.   [name], NP 06/29/23 [phone]

## 2023-06-30 LAB — SARS CORONAVIRUS 2 (TAT 6-24 HRS): SARS Coronavirus 2: NEGATIVE

## 2023-07-03 DIAGNOSIS — F4323 Adjustment disorder with mixed anxiety and depressed mood: Secondary | ICD-10-CM | POA: Diagnosis not present

## 2023-07-06 ENCOUNTER — Ambulatory Visit (INDEPENDENT_AMBULATORY_CARE_PROVIDER_SITE_OTHER): Payer: BC Managed Care – PPO | Admitting: *Deleted

## 2023-07-06 ENCOUNTER — Other Ambulatory Visit: Payer: Self-pay | Admitting: Student in an Organized Health Care Education/Training Program

## 2023-07-06 DIAGNOSIS — Z3009 Encounter for other general counseling and advice on contraception: Secondary | ICD-10-CM

## 2023-07-06 DIAGNOSIS — Z3042 Encounter for surveillance of injectable contraceptive: Secondary | ICD-10-CM

## 2023-07-06 DIAGNOSIS — Z3202 Encounter for pregnancy test, result negative: Secondary | ICD-10-CM

## 2023-07-06 LAB — POCT URINE PREGNANCY: Preg Test, Ur: NEGATIVE

## 2023-07-06 MED ORDER — MEDROXYPROGESTERONE ACETATE 104 MG/0.65ML ~~LOC~~ SUSY
104.0000 mg | PREFILLED_SYRINGE | Freq: Once | SUBCUTANEOUS | Status: AC
Start: 2023-07-06 — End: 2023-07-06
  Administered 2023-07-06: 104 mg via SUBCUTANEOUS

## 2023-07-06 NOTE — Addendum Note (Signed)
Addended by: Bufford Spikes on: 07/06/2023 01:56 PM   Modules accepted: Orders

## 2023-07-06 NOTE — Progress Notes (Signed)
Patient here for depot provera injection. Is a little late on timing, so will do a urine pregnancy test and give depot when confirmed negative.

## 2023-07-06 NOTE — Progress Notes (Signed)
Urine pregnancy test was Negative - Depo given Next injection due Nov 19 - Dec 3; dates given to pt.

## 2023-07-10 DIAGNOSIS — F411 Generalized anxiety disorder: Secondary | ICD-10-CM | POA: Diagnosis not present

## 2023-07-13 ENCOUNTER — Telehealth: Payer: Self-pay

## 2023-07-13 NOTE — Telephone Encounter (Signed)
RTC to patient has bumped head in the same spot x2 days.  Requesting and appointment to come in to get an MRI ordered.  Also has returning abdominal pain on left side. Advised to go to Urgent Care stated she would rather wait for an appointment in the Clinics.

## 2023-07-13 NOTE — Telephone Encounter (Signed)
Pt states she need to speak with a nurse about having pain on her head. She had hit her head twice last week, in the same spot. Pt states she still having pain and it's not going away.  Also want to speak with a nurse about enlarge spleen. Requesting an appt, no opening. Please call pt back.

## 2023-07-13 NOTE — Telephone Encounter (Signed)
RTC to patient given 9:15 AM appointment for tomorrow with Dr, Welton Flakes.

## 2023-07-14 ENCOUNTER — Other Ambulatory Visit: Payer: Self-pay | Admitting: Student

## 2023-07-14 ENCOUNTER — Encounter: Payer: Self-pay | Admitting: Internal Medicine

## 2023-07-14 ENCOUNTER — Ambulatory Visit (INDEPENDENT_AMBULATORY_CARE_PROVIDER_SITE_OTHER): Payer: BC Managed Care – PPO | Admitting: Internal Medicine

## 2023-07-14 VITALS — BP 132/91 | HR 114 | Temp 98.3°F | Ht 64.0 in | Wt 395.7 lb

## 2023-07-14 DIAGNOSIS — E119 Type 2 diabetes mellitus without complications: Secondary | ICD-10-CM

## 2023-07-14 DIAGNOSIS — S069XAA Unspecified intracranial injury with loss of consciousness status unknown, initial encounter: Secondary | ICD-10-CM | POA: Insufficient documentation

## 2023-07-14 DIAGNOSIS — S069X0A Unspecified intracranial injury without loss of consciousness, initial encounter: Secondary | ICD-10-CM

## 2023-07-14 DIAGNOSIS — Z7985 Long-term (current) use of injectable non-insulin antidiabetic drugs: Secondary | ICD-10-CM

## 2023-07-14 DIAGNOSIS — H538 Other visual disturbances: Secondary | ICD-10-CM

## 2023-07-14 DIAGNOSIS — S069X9A Unspecified intracranial injury with loss of consciousness of unspecified duration, initial encounter: Secondary | ICD-10-CM

## 2023-07-14 DIAGNOSIS — R519 Headache, unspecified: Secondary | ICD-10-CM | POA: Diagnosis not present

## 2023-07-14 DIAGNOSIS — H53149 Visual discomfort, unspecified: Secondary | ICD-10-CM

## 2023-07-14 MED ORDER — ACETAMINOPHEN 500 MG PO TABS
1000.0000 mg | ORAL_TABLET | Freq: Once | ORAL | Status: AC
Start: 2023-07-14 — End: 2023-07-14
  Administered 2023-07-14: 1000 mg via ORAL

## 2023-07-14 NOTE — Assessment & Plan Note (Signed)
Continue current therapy with Trulicity 0.75 mg weekly. Repeat A1c at next visit.

## 2023-07-14 NOTE — Assessment & Plan Note (Signed)
Pt with symptoms of mild TBI after head trauma. No neuro deficits noted on exam but pt reports memory impairment and some intermittent slurred speech. Given exam finding of TTP of head and the bogginess, will get CT head to look for any subdural hematoma. Gave pt ED precautions, including any nausea or vomiting or decrease in her level of consciousness. Advised her to take time off work until evaluation at next visit on 07/20/23. Work noted filled out and handed to the patient. Will follow up head imaging and follow up in person on 07/20/23.

## 2023-07-14 NOTE — Patient Instructions (Addendum)
[name], it was a pleasure seeing you today! You endorsed feeling well today. Below are some of the things we talked about this visit. We look forward to seeing you in the follow up appointment!  Today we discussed: For your head trauma, take tylenol up to 4000 mg daily for 2 weeks. I recommend taking this week off from work. I will give you a note. If your symptoms don't improve, please call to schedule a follow up.  We will get imaging of your head.    I have ordered the following labs today:  Lab Orders  No laboratory test(s) ordered today      Referrals ordered today:   Referral Orders  No referral(s) requested today     I have ordered the following medication/changed the following medications:   Stop the following medications: There are no discontinued medications.   Start the following medications: No orders of the defined types were placed in this encounter.    Follow-up: 1 week follow up   Please make sure to arrive 15 minutes prior to your next appointment. If you arrive late, you may be asked to reschedule.   We look forward to seeing you next time. Please call our clinic at [phone] if you have any questions or concerns. The best time to call is Monday-Friday from 9am-4pm, but there is someone available 24/7. If after hours or the weekend, call the main hospital number and ask for the Internal Medicine Resident On-Call. If you need medication refills, please notify your pharmacy one week in advance and they will send [place] a request.  Thank you for letting us take part in your care. Wishing you the best!  Thank you, Gwenevere Abbot, MD

## 2023-07-14 NOTE — Progress Notes (Signed)
CC: head trauma  HPI:  Jade Lopez is a 27 y.o. with medical history of DMII, OSA, Class III obesity, SLE, GAD presenting to [facility] for concern of head trauma and left sided abdominal pain.    Please see problem-based list for further details, assessments, and plans.  Past Medical History:  Diagnosis Date   Asthma    Autoimmune disease (HCC)    Blood clots in brain 2020   Class 3 obesity (HCC)    Endometriosis    Endometritis    Fibromyalgia    Sleep apnea    Sleep related headaches 02/24/2023   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   T2DM (type 2 diabetes mellitus) (HCC)    TOA (tubo-ovarian abscess)     Current Outpatient Medications (Endocrine & Metabolic):    Dulaglutide (TRULICITY) 0.75 MG/0.5ML SOPN, Inject 0.75 mg into the skin once a week.   medroxyPROGESTERone (DEPO-PROVERA) 150 MG/ML injection, Inject 150 mg into the muscle every 3 (three) months.   norethindrone (AYGESTIN) 5 MG tablet, Take 10 mg by mouth every evening.   Current Outpatient Medications (Respiratory):    albuterol (VENTOLIN HFA) 108 (90 Base) MCG/ACT inhaler, Inhale 1-2 puffs into the lungs every 6 (six) hours as needed for wheezing or shortness of breath.   budesonide-formoterol (SYMBICORT) 80-4.5 MCG/ACT inhaler, Inhale 2 puffs into the lungs 2 (two) times daily as needed.   promethazine-dextromethorphan (PROMETHAZINE-DM) 6.25-15 MG/5ML syrup, Take 5 mLs by mouth 4 (four) times daily as needed for cough.    Current Outpatient Medications (Other):    Accu-Chek Softclix Lancets lancets, daily.   Blood Glucose Monitoring Suppl (MM EASY TOUCH GLUCOSE METER) w/Device KIT, Please check your blood sugar daily, in the morning.   Blood Glucose Monitoring Suppl DEVI, 1 each by Does not apply route 3 (three) times daily. May dispense any manufacturer covered by patient's insurance.   busPIRone (BUSPAR) 10 MG tablet, Take 3 tablets (30 mg total) by mouth daily.   Glucose Blood (BLOOD GLUCOSE TEST STRIPS) STRP, 1  each by Does not apply route 3 (three) times daily. Use as directed to check blood sugar. May dispense any manufacturer covered by patient's insurance and fits patient's device.   Lancet Device MISC, 1 each by Does not apply route 3 (three) times daily. May dispense any manufacturer covered by patient's insurance.   Lancets MISC, 1 each by Does not apply route 3 (three) times daily. Use as directed to check blood sugar. May dispense any manufacturer covered by patient's insurance and fits patient's device.   ondansetron (ZOFRAN-ODT) 8 MG disintegrating tablet, Take 1 tablet (8 mg total) by mouth every 8 (eight) hours as needed for nausea or vomiting.   Pregabalin (LYRICA PO), Take by mouth.   pregabalin (LYRICA) 50 MG capsule, Take 1 capsule (50 mg total) by mouth 3 (three) times daily.  Review of Systems:  Review of system negative unless stated in the problem list or HPI.    Physical Exam:  Vitals:   07/14/23 0901  BP: (!) 132/91  Pulse: (!) 114  Temp: 98.3 F (36.8 C)  TempSrc: Oral  SpO2: 100%  Weight: (!) 395 lb 11.2 oz (179.5 kg)  Height: 5\' 4"  (1.626 m)   Physical Exam General: NAD, sitting on chair with coffee in hand HENT: Crown of head with TTP and notable bogginess.  Lungs: CTAB, no wheeze, rhonchi or rales.  Cardiovascular: Normal heart sounds, no r/m/g, 2+ pulses in all extremities. No LE edema Abdomen: No TTP, normal bowel sounds MSK:  No asymmetry or muscle atrophy.  Skin: no lesions noted on exposed skin Neuro: Alert and oriented x4. CN X-XII intact. Neuro exam with normal strength and sensation in all extremities. Concentration intact. No slurred speech noted.  Psych: Normal mood and normal affect   Assessment & Plan:  Overview: Hit head while getting up on 08/24 and 08/26. Headache in the area/pressure like sensation. Blurry vision, and photophobia noted after wards. States this is intermittent.   Pt's colleagues reported pt having some slurred speech.    Traumatic brain injury (HCC) Pt with symptoms of mild TBI after head trauma. No neuro deficits noted on exam but pt reports memory impairment and some intermittent slurred speech. Given exam finding of TTP of head and the bogginess, will get CT head to look for any subdural hematoma. Gave pt ED precautions, including any nausea or vomiting or decrease in her level of consciousness. Advised her to take time off work until evaluation at next visit on 07/20/23. Work noted filled out and handed to the patient. Will follow up head imaging and follow up in person on 07/20/23.  Type 2 diabetes mellitus (HCC) Continue current therapy with Trulicity 0.75 mg weekly. Repeat A1c at next visit.    See Encounters Tab for problem based charting.  Patient Discussed with Dr. Mauri Pole, MD Eligha Bridegroom. [facility] Internal Medicine Residency, PGY-3

## 2023-07-15 ENCOUNTER — Ambulatory Visit
Admission: RE | Admit: 2023-07-15 | Discharge: 2023-07-15 | Disposition: A | Discharge status: Home / Self Care | Payer: BC Managed Care – PPO | Source: Ambulatory Visit | Attending: Student in an Organized Health Care Education/Training Program | Admitting: Student in an Organized Health Care Education/Training Program

## 2023-07-15 DIAGNOSIS — R4182 Altered mental status, unspecified: Secondary | ICD-10-CM | POA: Diagnosis not present

## 2023-07-15 DIAGNOSIS — S069XAA Unspecified intracranial injury with loss of consciousness status unknown, initial encounter: Secondary | ICD-10-CM

## 2023-07-15 DIAGNOSIS — S0990XA Unspecified injury of head, initial encounter: Secondary | ICD-10-CM | POA: Diagnosis not present

## 2023-07-15 NOTE — Progress Notes (Signed)
Internal Medicine Clinic Attending  I was physically present during the key portions of the resident provided service and participated in the medical decision making of patient's management care. I reviewed pertinent patient test results.  The assessment, diagnosis, and plan were formulated together and I agree with the documentation in the resident's note.  Erlinda Hong, MD FACP

## 2023-07-17 DIAGNOSIS — G4733 Obstructive sleep apnea (adult) (pediatric): Secondary | ICD-10-CM | POA: Diagnosis not present

## 2023-07-17 DIAGNOSIS — F411 Generalized anxiety disorder: Secondary | ICD-10-CM | POA: Diagnosis not present

## 2023-07-19 DIAGNOSIS — G4733 Obstructive sleep apnea (adult) (pediatric): Secondary | ICD-10-CM | POA: Diagnosis not present

## 2023-07-20 ENCOUNTER — Other Ambulatory Visit: Payer: Self-pay

## 2023-07-20 ENCOUNTER — Encounter: Payer: Self-pay | Admitting: Internal Medicine

## 2023-07-20 ENCOUNTER — Ambulatory Visit (INDEPENDENT_AMBULATORY_CARE_PROVIDER_SITE_OTHER): Payer: BC Managed Care – PPO | Admitting: Internal Medicine

## 2023-07-20 VITALS — BP 123/92 | HR 108 | Temp 98.4°F | Ht 64.5 in | Wt 393.6 lb

## 2023-07-20 DIAGNOSIS — Z0000 Encounter for general adult medical examination without abnormal findings: Secondary | ICD-10-CM

## 2023-07-20 DIAGNOSIS — S069X0D Unspecified intracranial injury without loss of consciousness, subsequent encounter: Secondary | ICD-10-CM | POA: Diagnosis not present

## 2023-07-20 DIAGNOSIS — M329 Systemic lupus erythematosus, unspecified: Secondary | ICD-10-CM

## 2023-07-20 NOTE — Progress Notes (Unsigned)
CC: 1 week follow up  HPI:  [name] is a 27 y.o. female with a past medical history of [name] is a 27 y.o. with medical history of DMII, OSA, Class III obesity, SLE, GAD presenting to [facility] for follow up on head trauma.   Please see problem-based list for further details, assessments, and plans.  Past Medical History:  Diagnosis Date   Asthma    Autoimmune disease (HCC)    Blood clots in brain 2020   Class 3 obesity (HCC)    Endometriosis    Endometritis    Fibromyalgia    Near syncope 05/09/2023   Sleep apnea    Sleep related headaches 02/24/2023   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   T2DM (type 2 diabetes mellitus) (HCC)    TOA (tubo-ovarian abscess)     Current Outpatient Medications (Endocrine & Metabolic):    Dulaglutide (TRULICITY) 0.75 MG/0.5ML SOPN, Inject 0.75 mg into the skin once a week.   medroxyPROGESTERone (DEPO-PROVERA) 150 MG/ML injection, Inject 150 mg into the muscle every 3 (three) months.   norethindrone (AYGESTIN) 5 MG tablet, Take 10 mg by mouth every evening.   Current Outpatient Medications (Respiratory):    albuterol (VENTOLIN HFA) 108 (90 Base) MCG/ACT inhaler, Inhale 1-2 puffs into the lungs every 6 (six) hours as needed for wheezing or shortness of breath.   budesonide-formoterol (SYMBICORT) 80-4.5 MCG/ACT inhaler, Inhale 2 puffs into the lungs 2 (two) times daily as needed.   promethazine-dextromethorphan (PROMETHAZINE-DM) 6.25-15 MG/5ML syrup, Take 5 mLs by mouth 4 (four) times daily as needed for cough.    Current Outpatient Medications (Other):    Accu-Chek Softclix Lancets lancets, daily.   Blood Glucose Monitoring Suppl (MM EASY TOUCH GLUCOSE METER) w/Device KIT, Please check your blood sugar daily, in the morning.   Blood Glucose Monitoring Suppl DEVI, 1 each by Does not apply route 3 (three) times daily. May dispense any manufacturer covered by patient's insurance.   busPIRone (BUSPAR) 10 MG tablet, Take 3 tablets (30 mg  total) by mouth daily.   Glucose Blood (BLOOD GLUCOSE TEST STRIPS) STRP, 1 each by Does not apply route 3 (three) times daily. Use as directed to check blood sugar. May dispense any manufacturer covered by patient's insurance and fits patient's device.   Lancet Device MISC, 1 each by Does not apply route 3 (three) times daily. May dispense any manufacturer covered by patient's insurance.   Lancets MISC, 1 each by Does not apply route 3 (three) times daily. Use as directed to check blood sugar. May dispense any manufacturer covered by patient's insurance and fits patient's device.   ondansetron (ZOFRAN-ODT) 8 MG disintegrating tablet, Take 1 tablet (8 mg total) by mouth every 8 (eight) hours as needed for nausea or vomiting.   Pregabalin (LYRICA PO), Take by mouth.   pregabalin (LYRICA) 50 MG capsule, Take 1 capsule (50 mg total) by mouth 3 (three) times daily.  Review of Systems:  Review of system negative unless stated in the problem list or HPI.    Physical Exam:  Vitals:   07/20/23 1421  BP: (!) 123/92  Pulse: (!) 108  Temp: 98.4 F (36.9 C)  TempSrc: Oral  SpO2: 97%  Weight: (!) 393 lb 9.6 oz (178.5 kg)  Height: 5' 4.5" (1.638 m)   Physical Exam General: NAD HENT: NCAT Lungs: CTAB, no wheeze, rhonchi or rales.  Cardiovascular: Normal heart sounds, no r/m/g, 2+ pulses in all extremities. No LE edema Abdomen: No TTP, normal bowel sounds MSK:  No asymmetry or muscle atrophy.  Skin: no lesions noted on exposed skin Neuro: Alert and oriented x4. CN grossly intact, good sensation and strength in all extremities, good concentration and word recall Psych: Normal mood and normal affect   Assessment & Plan:   Traumatic brain injury (HCC) Pt states she continues to have symptoms of headache, cognitive slowing after hear injury. She notes some nausea as well. States symptoms have not worsened but have not improved much either. Discussed the natural course of TBI and that symptoms  typically improve after 10 days. Discussed normal imaging finding of her CT. Pt's symptoms are complicated by the fact she has hx of migraines and symptoms of nausea and vomiting prompting ED prior to this event. Will refer pt to concussion clinic and follow up in one week. For her headaches, she was only taking tylenol but I advised her to add ibuprofen to her regimen. Reassured by her neuro exam and overall well appearance.   Healthcare maintenance Pt declined lab work this visit but can perform HIV, Hep C and A1c at next visit.   Lupus (HCC) Advised pt she needs to follow up with rheumatology as it is unclear if she has lupus. She is not on any treatments for this but reports a hx of lupus. ANA and anti-DNA that was checked were normal. She has an appointment on 07/26/23.    See Encounters Tab for problem based charting.  Patient Discussed with Dr. Thersa Salt, MD Eligha Bridegroom. [facility] Internal Medicine Residency, PGY-3

## 2023-07-20 NOTE — Patient Instructions (Addendum)
[name], it was a pleasure seeing you today! You endorsed feeling well today. Below are some of the things we talked about this visit. We look forward to seeing you in the follow up appointment!  Today we discussed: You continue to report symptoms of headache and slowed thinking. I am referring you to neurology.  You can continue taking tylenol up to 3000 mg daily and take ibuprofen as indicated on the label. This can be purchased over the counter. I will see you back in one week.   I have ordered the following labs today:  Lab Orders  No laboratory test(s) ordered today      Referrals ordered today:    Referral Orders         Ambulatory referral to Neurology      I have ordered the following medication/changed the following medications:   Stop the following medications: There are no discontinued medications.   Start the following medications: No orders of the defined types were placed in this encounter.    Follow-up: 1 week follow up   Please make sure to arrive 15 minutes prior to your next appointment. If you arrive late, you may be asked to reschedule.   We look forward to seeing you next time. Please call our clinic at [phone] if you have any questions or concerns. The best time to call is Monday-Friday from 9am-4pm, but there is someone available 24/7. If after hours or the weekend, call the main hospital number and ask for the Internal Medicine Resident On-Call. If you need medication refills, please notify your pharmacy one week in advance and they will send [place] a request.  Thank you for letting us take part in your care. Wishing you the best!  Thank you, Gwenevere Abbot, MD

## 2023-07-22 NOTE — Assessment & Plan Note (Signed)
Advised pt she needs to follow up with rheumatology as it is unclear if she has lupus. She is not on any treatments for this but reports a hx of lupus. ANA and anti-DNA that was checked were normal. She has an appointment on 07/26/23.

## 2023-07-22 NOTE — Assessment & Plan Note (Signed)
Pt declined lab work this visit but can perform HIV, Hep C and A1c at next visit.

## 2023-07-22 NOTE — Assessment & Plan Note (Addendum)
Pt states she continues to have symptoms of headache, cognitive slowing after hear injury. She notes some nausea as well. States symptoms have not worsened but have not improved much either. Discussed the natural course of TBI and that symptoms typically improve after 10 days. Discussed normal imaging finding of her CT. Pt's symptoms are complicated by the fact she has hx of migraines and symptoms of nausea and vomiting prompting ED prior to this event. Will refer pt to concussion clinic and follow up in one week. For her headaches, she was only taking tylenol but I advised her to add ibuprofen to her regimen. Reassured by her neuro exam and overall well appearance.

## 2023-07-23 DIAGNOSIS — G8929 Other chronic pain: Secondary | ICD-10-CM | POA: Diagnosis not present

## 2023-07-23 DIAGNOSIS — N644 Mastodynia: Secondary | ICD-10-CM | POA: Diagnosis not present

## 2023-07-23 DIAGNOSIS — R102 Pelvic and perineal pain: Secondary | ICD-10-CM | POA: Diagnosis not present

## 2023-07-23 DIAGNOSIS — N6312 Unspecified lump in the right breast, upper inner quadrant: Secondary | ICD-10-CM | POA: Diagnosis not present

## 2023-07-26 ENCOUNTER — Encounter: Payer: BC Managed Care – PPO | Admitting: Internal Medicine

## 2023-07-26 NOTE — Addendum Note (Signed)
Addended by: Gwenevere Abbot on: 07/26/2023 11:43 PM   Modules accepted: Orders

## 2023-07-26 NOTE — Progress Notes (Signed)
Internal Medicine Clinic Attending  Case discussed with the resident at the time of the visit.  We reviewed the resident's history and exam and pertinent patient test results.  I agree with the assessment, diagnosis, and plan of care documented in the resident's note.  

## 2023-07-26 NOTE — Progress Notes (Deleted)
Office Visit Note  Patient: Jade Lopez             Date of Birth: [birth year]           MRN: [id number]             PCP: Manuela Neptune, MD Referring: Dickie La, MD Visit Date: 07/26/2023 Occupation: @GUAROCC @  Subjective:  No chief complaint on file.   History of Present Illness: Jade Lopez is a 27 y.o. female ***     Activities of Daily Living:  Patient reports morning stiffness for *** {minute/hour:19697}.   Patient {ACTIONS;DENIES/REPORTS:21021675::"Denies"} nocturnal pain.  Difficulty dressing/grooming: {ACTIONS;DENIES/REPORTS:21021675::"Denies"} Difficulty climbing stairs: {ACTIONS;DENIES/REPORTS:21021675::"Denies"} Difficulty getting out of chair: {ACTIONS;DENIES/REPORTS:21021675::"Denies"} Difficulty using hands for taps, buttons, cutlery, and/or writing: {ACTIONS;DENIES/REPORTS:21021675::"Denies"}  No Rheumatology ROS completed.   PMFS History:  Patient Active Problem List   Diagnosis Date Noted   Traumatic brain injury (HCC) 07/14/2023   History of nausea and vomiting 05/19/2023   Fibromyalgia 04/11/2023   Abscess of right breast 04/03/2023   Abdominal wall pain 03/01/2023   Sleep related headaches 02/24/2023   OSA on CPAP 02/24/2023   Sleeps in sitting position due to orthopnea 02/24/2023   Menorrhagia 01/28/2023   Healthcare maintenance 01/28/2023   Chest pain 01/28/2023   Back pain 01/28/2023   Type 2 diabetes mellitus (HCC) 11/28/2022   Lupus (HCC) 11/28/2022   Class 3 obesity (HCC) 11/28/2022   Endometriosis 11/28/2022   Anxiety 11/28/2022   Vitamin D deficiency 09/13/2012   Asthma 08/22/2012    Past Medical History:  Diagnosis Date   Asthma    Autoimmune disease (HCC)    Blood clots in brain 2020   Class 3 obesity (HCC)    Endometriosis    Endometritis    Fibromyalgia    Near syncope 05/09/2023   Sleep apnea    Sleep related headaches 02/24/2023   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   T2DM (type 2 diabetes mellitus) (HCC)     TOA (tubo-ovarian abscess)     Family History  Problem Relation Age of Onset   Diabetes Mother    Hypertension Mother    Diabetes Father    Hypertension Father    Sleep apnea Father    Past Surgical History:  Procedure Laterality Date   CHOLECYSTECTOMY     DILATATION AND CURETTAGE/HYSTEROSCOPY WITH MINERVA     3 total   TONSILLECTOMY     TONSILLECTOMY AND ADENOIDECTOMY     Social History   Social History Narrative   Not on file   Immunization History  Administered Date(s) Administered   Influenza, Seasonal, Injecte, Preservative Fre 08/08/2008   PPD Test 10/07/2022   Td 07/17/2015     Objective: Vital Signs: There were no vitals taken for this visit.   Physical Exam   Musculoskeletal Exam: ***  CDAI Exam: CDAI Score: -- Patient Global: --; Provider Global: -- Swollen: --; Tender: -- Joint Exam 07/26/2023   No joint exam has been documented for this visit   There is currently no information documented on the homunculus. Go to the Rheumatology activity and complete the homunculus joint exam.  Investigation: No additional findings.  Imaging: CT HEAD WO CONTRAST ( )  Result Date: 07/15/2023 CLINICAL DATA:  Head trauma, abnormal mental status (Age 97-64y) EXAM: CT HEAD WITHOUT CONTRAST TECHNIQUE: Contiguous axial images were obtained from the base of the skull through the vertex without intravenous contrast. RADIATION DOSE REDUCTION: This exam was performed according to the departmental dose-optimization program which includes automated exposure control, adjustment  of the mA and/or kV according to patient size and/or use of iterative reconstruction technique. COMPARISON:  None Available. FINDINGS: Brain: No evidence of acute infarction, hemorrhage, hydrocephalus, extra-axial collection or mass lesion/mass effect. Vascular: No hyperdense vessel or unexpected calcification. Skull: Normal. Negative for fracture or focal lesion. Sinuses/Orbits: No middle ear or mastoid  effusion. Paranasal sinuses are clear. Orbits are unremarkable. Other: None. IMPRESSION: No acute intracranial abnormality. Electronically Signed   By: Lorenza Cambridge M.D.   On: 07/15/2023 12:53    Recent Labs: Lab Results  Component Value Date   WBC 14.4 (H) 05/09/2023   HGB 13.1 05/09/2023   PLT 341 05/09/2023   NA 133 (L) 05/09/2023   K 3.9 05/09/2023   CL 103 05/09/2023   CO2 20 (L) 05/09/2023   GLUCOSE 268 (H) 05/09/2023   BUN 10 05/09/2023   CREATININE 0.69 05/09/2023   BILITOT 0.4 05/09/2023   ALKPHOS 92 05/09/2023   AST 11 (L) 05/09/2023   ALT 16 05/09/2023   PROT 7.1 05/09/2023   ALBUMIN 3.3 (L) 05/09/2023   CALCIUM 8.5 (L) 05/09/2023    Speciality Comments: No specialty comments available.  Procedures:  No procedures performed Allergies: Clindamycin/lincomycin, Gabapentin, Other, Chlorpheniramine-phenylephrine, and Pregabalin   Assessment / Plan:     Visit Diagnoses: No diagnosis found.  Orders: No orders of the defined types were placed in this encounter.  No orders of the defined types were placed in this encounter.   Face-to-face time spent with patient was *** minutes. Greater than 50% of time was spent in counseling and coordination of care.  Follow-Up Instructions: No follow-ups on file.   Fuller Plan, MD  Note - This record has been created using [employer].  Chart creation errors have been sought, but may not always  have been located. Such creation errors do not reflect on  the standard of medical care.

## 2023-07-28 ENCOUNTER — Encounter: Payer: Self-pay | Admitting: Internal Medicine

## 2023-07-28 ENCOUNTER — Ambulatory Visit (INDEPENDENT_AMBULATORY_CARE_PROVIDER_SITE_OTHER): Payer: BC Managed Care – PPO | Admitting: Internal Medicine

## 2023-07-28 ENCOUNTER — Other Ambulatory Visit: Payer: Self-pay

## 2023-07-28 VITALS — BP 120/78 | HR 108 | Temp 98.0°F | Ht 64.0 in | Wt 397.2 lb

## 2023-07-28 DIAGNOSIS — S069X0D Unspecified intracranial injury without loss of consciousness, subsequent encounter: Secondary | ICD-10-CM

## 2023-07-28 DIAGNOSIS — E1169 Type 2 diabetes mellitus with other specified complication: Secondary | ICD-10-CM

## 2023-07-28 DIAGNOSIS — Z7985 Long-term (current) use of injectable non-insulin antidiabetic drugs: Secondary | ICD-10-CM | POA: Diagnosis not present

## 2023-07-28 DIAGNOSIS — S069X0S Unspecified intracranial injury without loss of consciousness, sequela: Secondary | ICD-10-CM

## 2023-07-28 NOTE — Progress Notes (Signed)
Subjective:  CC: head injury  HPI:  [name] is a 27 y.o. female with a past medical history stated below and presents today for above. Please see problem based assessment and plan for additional details.  Past Medical History:  Diagnosis Date   Asthma    Autoimmune disease (HCC)    Blood clots in brain 2020   Class 3 obesity (HCC)    Endometriosis    Endometritis    Fibromyalgia    Near syncope 05/09/2023   Sleep apnea    Sleep related headaches 02/24/2023   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   T2DM (type 2 diabetes mellitus) (HCC)    TOA (tubo-ovarian abscess)     Current Outpatient Medications on File Prior to Visit  Medication Sig Dispense Refill   Accu-Chek Softclix Lancets lancets daily.     albuterol (VENTOLIN HFA) 108 (90 Base) MCG/ACT inhaler Inhale 1-2 puffs into the lungs every 6 (six) hours as needed for wheezing or shortness of breath. 1 each 1   Blood Glucose Monitoring Suppl (MM EASY TOUCH GLUCOSE METER) w/Device KIT Please check your blood sugar daily, in the morning. 1 kit 0   Blood Glucose Monitoring Suppl DEVI 1 each by Does not apply route 3 (three) times daily. May dispense any manufacturer covered by patient's insurance. 1 each 0   budesonide-formoterol (SYMBICORT) 80-4.5 MCG/ACT inhaler Inhale 2 puffs into the lungs 2 (two) times daily as needed. 1 each 12   busPIRone (BUSPAR) 10 MG tablet Take 3 tablets (30 mg total) by mouth daily. 30 tablet 5   Dulaglutide (TRULICITY) 0.75 MG/0.5ML SOPN Inject 0.75 mg into the skin once a week. 2 mL 1   Glucose Blood (BLOOD GLUCOSE TEST STRIPS) STRP 1 each by Does not apply route 3 (three) times daily. Use as directed to check blood sugar. May dispense any manufacturer covered by patient's insurance and fits patient's device. 100 strip 0   Lancet Device MISC 1 each by Does not apply route 3 (three) times daily. May dispense any manufacturer covered by patient's insurance. 1 each 0   Lancets MISC 1 each by Does not  apply route 3 (three) times daily. Use as directed to check blood sugar. May dispense any manufacturer covered by patient's insurance and fits patient's device. 100 each 0   medroxyPROGESTERone (DEPO-PROVERA) 150 MG/ML injection Inject 150 mg into the muscle every 3 (three) months.     norethindrone (AYGESTIN) 5 MG tablet Take 10 mg by mouth every evening.     ondansetron (ZOFRAN-ODT) 8 MG disintegrating tablet Take 1 tablet (8 mg total) by mouth every 8 (eight) hours as needed for nausea or vomiting. 20 tablet 0   Pregabalin (LYRICA PO) Take by mouth.     pregabalin (LYRICA) 50 MG capsule Take 1 capsule (50 mg total) by mouth 3 (three) times daily. 90 capsule 3   promethazine-dextromethorphan (PROMETHAZINE-DM) 6.25-15 MG/5ML syrup Take 5 mLs by mouth 4 (four) times daily as needed for cough. 118 mL 0   No current facility-administered medications on file prior to visit.    Review of Systems: ROS negative except for as is noted on the assessment and plan.  Objective:   Vitals:   07/28/23 1314  BP: 120/78  Pulse: (!) 108  Temp: 98 F (36.7 C)  TempSrc: Oral  SpO2: 100%  Weight: (!) 397 lb 3.2 oz (180.2 kg)  Height: 5\' 4"  (1.626 m)    Physical Exam: Constitutional: well-appearing, in no acute distress HENT: normocephalic atraumatic,  mucous membranes moist Eyes: conjunctiva non-erythematous Neck: supple Cardiovascular: regular rate and rhythm, no m/r/g Pulmonary/Chest: normal work of breathing on room air, lungs clear to auscultation bilaterally Abdominal: soft, non-tender, non-distended MSK: normal bulk and tone Neurological: alert & oriented x 3, 5/5 strength in bilateral upper and lower extremities, normal gait Skin: warm and dry  Assessment & Plan:   Traumatic brain injury [facility]) Patient here for third encounter after hitting her had on a doorframe on 8/24, then hitting her head on a desk on 8/26. Patient reports continued symptoms since last visit with only mild  improvement. She has a chronic headache with intermittent periods of more severe pain. Tylenol and ibuprofen are only moderately helpful. She states she has memory loss such as forgetting which day it is, intermittent blurry vision, some nausea with no vomiting, occasional lightheadedness. Patient has not returned to work and is working on [employer]. Discussed typical time course of concussion and how symptom improvement happens slowly. Physical exam is reassuring with no neurological deficits. -Will message about concussion clinic referral placed at last visit  Type 2 diabetes mellitus [facility]) Patient has only taken her prescribed Trulicity one time. She has been stressed and does not want her A1c checked today.  -Appointment scheduled for one month from now for A1c check. Encouraged pt to take Trulicity until then   Patient seen with Dr. Rance Muir MD [facility] Internal Medicine  PGY-1 Pager: [phone] Date 07/28/2023  Time 1:54 PM

## 2023-07-28 NOTE — Patient Instructions (Signed)
Jade Lopez,   It was a pleasure meeting you today. For your pain, I recommend continuing ibuprofen, Tylenol, and ice as discussed. I will also look into the concussion clinic and see if there's anything to get that scheduled sooner.  We will plan to see you again in about 1 month for an A1c check. Please continue to take your Trulicity until then.   Thanks,  Dr Carlynn Purl

## 2023-07-28 NOTE — Assessment & Plan Note (Signed)
Patient has only taken her prescribed Trulicity one time. She has been stressed and does not want her A1c checked today.  -Appointment scheduled for one month from now for A1c check. Encouraged pt to take Trulicity until then

## 2023-07-28 NOTE — Assessment & Plan Note (Addendum)
Patient here for third encounter after hitting her had on a doorframe on 8/24, then hitting her head on a desk on 8/26. Patient reports continued symptoms since last visit with only mild improvement. She has a chronic headache with intermittent periods of more severe pain. Tylenol and ibuprofen are only moderately helpful. She states she has memory loss such as forgetting which day it is, intermittent blurry vision, some nausea with no vomiting, occasional lightheadedness. Patient has not returned to work and is working on [employer]. Discussed typical time course of concussion and how symptom improvement happens slowly. Physical exam is reassuring with no neurological deficits. -Will message about concussion clinic referral placed at last visit

## 2023-07-29 ENCOUNTER — Ambulatory Visit (INDEPENDENT_AMBULATORY_CARE_PROVIDER_SITE_OTHER): Payer: BC Managed Care – PPO | Admitting: Sports Medicine

## 2023-07-29 ENCOUNTER — Ambulatory Visit: Payer: BC Managed Care – PPO | Attending: Internal Medicine | Admitting: Internal Medicine

## 2023-07-29 ENCOUNTER — Encounter: Payer: Self-pay | Admitting: Internal Medicine

## 2023-07-29 VITALS — HR 103 | Ht 64.0 in | Wt 396.0 lb

## 2023-07-29 VITALS — BP 129/84 | HR 93 | Resp 14 | Ht 64.5 in | Wt 394.0 lb

## 2023-07-29 DIAGNOSIS — N611 Abscess of the breast and nipple: Secondary | ICD-10-CM | POA: Diagnosis not present

## 2023-07-29 DIAGNOSIS — R899 Unspecified abnormal finding in specimens from other organs, systems and tissues: Secondary | ICD-10-CM | POA: Diagnosis not present

## 2023-07-29 DIAGNOSIS — G44319 Acute post-traumatic headache, not intractable: Secondary | ICD-10-CM | POA: Diagnosis not present

## 2023-07-29 DIAGNOSIS — M797 Fibromyalgia: Secondary | ICD-10-CM

## 2023-07-29 DIAGNOSIS — S060X0A Concussion without loss of consciousness, initial encounter: Secondary | ICD-10-CM

## 2023-07-29 DIAGNOSIS — M329 Systemic lupus erythematosus, unspecified: Secondary | ICD-10-CM | POA: Diagnosis not present

## 2023-07-29 MED ORDER — CELECOXIB 200 MG PO CAPS: 200.0000 mg | ORAL_CAPSULE | Freq: Two times a day (BID) | ORAL | 0 refills | Status: DC

## 2023-07-29 MED ORDER — TRAZODONE HCL 50 MG PO TABS: 50.0000 mg | ORAL_TABLET | Freq: Every day | ORAL | 0 refills | Status: DC

## 2023-07-29 NOTE — Progress Notes (Signed)
Jade Lopez D.[name] [address] [place] [address] Phone: [phone]  Assessment and Plan:     1. Concussion without loss of consciousness, initial encounter 2. Acute post-traumatic headache, not intractable -Acute, complicated, initial sports medicine visit - Possible concussion.  Currently unclear if patient is experiencing a concussion based on relatively mild MOI with patient bumping her head twice within 3 days, no loss of consciousness, and no symptoms between the first incident and second incident, relatively unremarkable objective physical exam, comorbidities including fibromyalgia and chronic headaches.  Patient does have MOI involving head, subjective complaints of dizziness, short-term memory loss, brain fog, that could be consistent with concussion - In this case of unclear concussion presentation, I would recommend erring on the side of caution and treating it as a concussion - Based on patient's continued symptom severity, recommend out of work for an additional 2 weeks.  Work note provided.  Patient was previously getting work notes from her internal medicine physician, but said she would transition to using [place] for work notes moving forwards.  I am okay writing work notes for short periods of time while we work on improving acute concussion like symptoms.  She mentioned that she was filing for [employer].  I discussed with her that we do not do Worker's Compensation in our office and that she would need to speak with her HR department to see what physician she needs to say for [employer]. - Start Celebrex 200 mg twice daily for head and neck pain - Start melatonin 5 to 10 mg nightly for sleep aid.  Start trazodone 50 to 100 mg nightly for sleep aid.  Goal of 7 to 8 hours of sleep nightly.  Patient only getting 3 hours of broken sleep currently. -Reassuring the patient had negative CT head on 07/15/2023   Date of injury was  8/24 and 07/05/2023 .Original symptom severity scores were 22 and 86 The patient was counseled on the nature of the injury, typical course and potential options for further evaluation and treatment. Discussed the importance of compliance with recommendations. Patient stated understanding of this plan and willingness to comply.  Recommendations:  -  Relative mental and physical rest for 48 hours after concussive event - Recommend light aerobic activity while keeping symptoms less than 3/10 - Stop mental or physical activities that cause symptoms to worsen greater than 3/10, and wait 24 hours before attempting them again - Eliminate screen time as much as possible for first 48 hours after concussive event, then continue limited screen time (recommend less than 2 hours per day)   - Encouraged to RTC in 2 weeks for reassessment or sooner for any concerns or acute changes   Pertinent previous records reviewed include internal medicine note 07/28/2023, internal medicine note 07/20/2023, internal medicine note 07/15/23, CT head 07/15/2023   Time of visit 49 minutes, which included chart review, physical exam, treatment plan, symptom severity score, VOMS, and tandem gait testing being performed, interpreted, and discussed with patient at today's visit.   Subjective:   I, Jade Lopez, am serving as a Neurosurgeon for Doctor Richardean Sale  Chief Complaint: concussion symptoms   HPI:   07/29/23 Patient is a 27 year old female complaining of concussion symptoms. Patient states third encounter after hitting her had on a doorframe on 8/24, then hitting her head on a desk on 8/26. Patient reports continued symptoms since last visit with only mild improvement. She has a chronic headache with  intermittent periods of more severe pain. Tylenol and ibuprofen are only moderately helpful. She states she has memory loss such as forgetting which day it is, intermittent blurry vision, some nausea with no vomiting,  occasional lightheadedness.    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 No Diagnosed/treated for headache disorder, migraines, or seizures?yes  Diagnosed with learning disability Jade Lopez? Yes dyslexia  Diagnosed with ADD/ADHD? no Diagnose with Depression, anxiety, or other Psychiatric Disorder? Yes    Current medications:  Current Outpatient Medications  Medication Sig Dispense Refill   Accu-Chek Softclix Lancets lancets daily.     albuterol (VENTOLIN HFA) 108 (90 Base) MCG/ACT inhaler Inhale 1-2 puffs into the lungs every 6 (six) hours as needed for wheezing or shortness of breath. 1 each 1   Blood Glucose Monitoring Suppl (MM EASY TOUCH GLUCOSE METER) w/Device KIT Please check your blood sugar daily, in the morning. 1 kit 0   Blood Glucose Monitoring Suppl DEVI 1 each by Does not apply route 3 (three) times daily. May dispense any manufacturer covered by patient's insurance. 1 each 0   budesonide-formoterol (SYMBICORT) 80-4.5 MCG/ACT inhaler Inhale 2 puffs into the lungs 2 (two) times daily as needed. 1 each 12   busPIRone (BUSPAR) 10 MG tablet Take 3 tablets (30 mg total) by mouth daily. 30 tablet 5   Dulaglutide (TRULICITY) 0.75 MG/0.5ML SOPN Inject 0.75 mg into the skin once a week. 2 mL 1   Glucose Blood (BLOOD GLUCOSE TEST STRIPS) STRP 1 each by Does not apply route 3 (three) times daily. Use as directed to check blood sugar. May dispense any manufacturer covered by patient's insurance and fits patient's device. 100 strip 0   Lancet Device MISC 1 each by Does not apply route 3 (three) times daily. May dispense any manufacturer covered by patient's insurance. 1 each 0    Lancets MISC 1 each by Does not apply route 3 (three) times daily. Use as directed to check blood sugar. May dispense any manufacturer covered by patient's insurance and fits patient's device. 100 each 0   medroxyPROGESTERone (DEPO-PROVERA) 150 MG/ML injection Inject 150 mg into the muscle every 3 (three) months.     norethindrone (AYGESTIN) 5 MG tablet Take 10 mg by mouth every evening.     ondansetron (ZOFRAN-ODT) 8 MG disintegrating tablet Take 1 tablet (8 mg total) by mouth every 8 (eight) hours as needed for nausea or vomiting. 20 tablet 0   Pregabalin (LYRICA PO) Take by mouth.     pregabalin (LYRICA) 50 MG capsule Take 1 capsule (50 mg total) by mouth 3 (three) times daily. 90 capsule 3   promethazine-dextromethorphan (PROMETHAZINE-DM) 6.25-15 MG/5ML syrup Take 5 mLs by mouth 4 (four) times daily as needed for cough. 118 mL 0   No current facility-administered medications for this visit.      Objective:     Vitals:   07/29/23 1244  Pulse: (!) 103  SpO2: 97%  Weight: (!) 396 lb (179.6 kg)  Height: 5\' 4"  (1.626 m)      Body mass index is 67.97 kg/m.    Physical Exam:     General: Well-appearing, cooperative,  sitting comfortably in no acute distress.  Psychiatric: Mood and affect are appropriate.   Neuro:sensation intact and strength 5/5 with no deficits, no atrophy, normal muscle tone   Today's Symptom Severity Score:  Scores: 0-6  Headache:6 "Pressure in head":6  Neck Pain:3 Nausea or vomiting:3 Dizziness:4 Blurred vision:5 Balance problems:2 Sensitivity to light:4 Sensitivity to noise:5 Feeling slowed down:3 Feeling like "in a fog":4 "Don't feel right":4 Difficulty concentrating:4 Difficulty remembering:5  Fatigue or low energy:5 Confusion:5  Drowsiness:5  More emotional:4 Irritability:2 Sadness:3  Nervous or Anxious:3 Trouble falling or staying asleep:6  Total number of symptoms: 22/22  Symptom Severity index: 86/132  Worse with physical activity?  Yes  Worse with mental activity? Yes  Percent improved since injury: 25-30%    Full pain-free cervical PROM: No, posterior head pain with ball neck motion   Cognitive:  - Months backwards: 1 Mistakes. 24 seconds  mVOMS:   - Baseline symptoms: Mild headache - Horizontal Vestibular-Ocular Reflex: Double and blurred vision - Smooth pursuits: "Trouble keeping up" with double and blurred vision - Horizontal Saccades: Double vision - Visual Motion Sensitivity Test: Subjective dizziness 5/10, without objective signs - Convergence: 3, 3 cm (<5 cm normal)    Autonomic:  - Symptomatic with supine to standing: Yes, complaining of subjective dizziness, though no objective signs  Complex Tandem Gait: - Forward, eyes open: 3 errors - Backward, eyes open: 3 errors - Forward, eyes closed: 5 errors - Backward, eyes closed: 5 errors  Electronically signed by:  Jade Lopez [name] 1:21 PM 07/29/23

## 2023-07-29 NOTE — Patient Instructions (Addendum)
Recommendations:  - Relative mental and physical rest for 48 hours after concussive event -Recommend light aerobic activity while keeping symptoms less than 3/10 -Stop mental or physical activities that cause symptoms to worsen greater than 3/10, and wait 24 hours before attempting them again -Eliminate screen time as much as possible for first 48 hours after concussive event, then continue limited screen time (recommend less than 2 hours per day) Start melatonin 5 mg nightly you can buy this over the counter the goal is 7-8 hours of sleep per night  Trazodone 50 mg nightly with the goal of 7-8 hours of sleep nightly if that is not affective you can increase to 00 mg nightly  Celebrex 200 mg twice a day for 2 weeks . If still in pain continue for 1 more week. Can use Tylenol [phone] mg 2-3 times a day for pain relief  Work note provided out of work  2 week follow up

## 2023-07-30 NOTE — Progress Notes (Signed)
Internal Medicine Clinic Attending  I saw and evaluated the patient.  I personally confirmed the key portions of the history and exam documented by the resident and I reviewed pertinent patient test results.  The assessment, diagnosis, and plan were formulated together and I agree with the documentation in the resident's note.   Now ~3 weeks from initial head injury. We discussed slow improvement of symptoms and continued treatment with acetaminophen/NSAIDs for headaches. F/u with concussion clinic scheduled 10/3. Jade Lopez has stopped taking Trulicity which we discussed today. She is willing to resume this weekly and f/u in one month for A1c.

## 2023-07-31 DIAGNOSIS — F411 Generalized anxiety disorder: Secondary | ICD-10-CM | POA: Diagnosis not present

## 2023-07-31 LAB — PROTEIN / CREATININE RATIO, URINE
Creatinine, Urine: 129 mg/dL (ref 20–275)
Protein/Creat Ratio: 54 mg/g creat (ref 24–184)
Protein/Creatinine Ratio: 0.054 mg/mg creat (ref 0.024–0.184)
Total Protein, Urine: 7 mg/dL (ref 5–24)

## 2023-07-31 LAB — C3 AND C4
C3 Complement: 238 mg/dL — ABNORMAL HIGH (ref 83–193)
C4 Complement: 34 mg/dL (ref 15–57)

## 2023-07-31 LAB — IGG, IGA, IGM
IgG (Immunoglobin G), Serum: 1319 mg/dL (ref 600–1640)
IgM, Serum: 253 mg/dL (ref 50–300)
Immunoglobulin A: 279 mg/dL (ref 47–310)

## 2023-07-31 LAB — CYCLIC CITRUL PEPTIDE ANTIBODY, IGG: Cyclic Citrullin Peptide Ab: <16 UNITS

## 2023-07-31 LAB — RNP ANTIBODY: Ribonucleic Protein(ENA) Antibody, IgG: <1 AI

## 2023-07-31 LAB — ANTI-SMITH ANTIBODY: ENA SM Ab Ser-aCnc: <1 AI

## 2023-07-31 LAB — C-REACTIVE PROTEIN: CRP: 51.7 mg/L — ABNORMAL HIGH (ref <8.0)

## 2023-07-31 LAB — SEDIMENTATION RATE: Sed Rate: 67 mm/h — ABNORMAL HIGH (ref 0–20)

## 2023-07-31 LAB — SJOGRENS SYNDROME-A EXTRACTABLE NUCLEAR ANTIBODY: SSA (Ro) (ENA) Antibody, IgG: <1 AI

## 2023-07-31 LAB — SJOGRENS SYNDROME-B EXTRACTABLE NUCLEAR ANTIBODY: SSB (La) (ENA) Antibody, IgG: <1 AI

## 2023-08-02 DIAGNOSIS — G4733 Obstructive sleep apnea (adult) (pediatric): Secondary | ICD-10-CM | POA: Diagnosis not present

## 2023-08-04 NOTE — Telephone Encounter (Signed)
Patient has had pain in right breast, swelling, skin changing color, also peeling, would like to speak to a nurse to see what her next options would be if she need to see Dr. Alvester Morin again or go to the emergency room

## 2023-08-04 NOTE — Telephone Encounter (Signed)
Called patient at number listed in chart and verified using full name and DOB.   Hx: Patient reports that she had been seen by [facility] for breast problems (that we have seen her for in office before)--per patient, [facility] stated that it was an oil abscess.  Patient reports swelling and inflammation around abscess and nipple area of R breast x1 week--swelling to the point where it leaves indentation if pressed on. Patient also reports skin discoloration from normal skin color to lighter and more reddish color around the abscess and nipple. Patient states that there are also areas of skin peeling with smaller bumps that contain pus, all around the abscess area. Patient denied any drainage as of yet. Patient states that she is in severe, shooting-like pain in her R breast, especially at the nipple area.   Patient has attempted to cover area with band-aid and gauze, however has ceased since it was causing pain due to contact of the nipple and abscess area. Patient has been taking tylenol around the clock x1 week--states it's not helping/relieving pain. Patient found old Rx'd doxycycline from previous diagnose and took it x 5 days (last pill on Monday 08/02/23)--no change in breast.   Informed patient that I would send all this information to Dr. Alvester Morin per patient request and that I would send patient MyChart message so that she can send pictures of current state of R breast.   Maureen Ralphs RN on 08/04/23 at 1609

## 2023-08-05 ENCOUNTER — Encounter (HOSPITAL_BASED_OUTPATIENT_CLINIC_OR_DEPARTMENT_OTHER): Payer: Self-pay | Admitting: Emergency Medicine

## 2023-08-05 DIAGNOSIS — Z7689 Persons encountering health services in other specified circumstances: Secondary | ICD-10-CM | POA: Diagnosis not present

## 2023-08-05 DIAGNOSIS — N611 Abscess of the breast and nipple: Secondary | ICD-10-CM | POA: Insufficient documentation

## 2023-08-05 DIAGNOSIS — J45909 Unspecified asthma, uncomplicated: Secondary | ICD-10-CM | POA: Diagnosis not present

## 2023-08-05 DIAGNOSIS — E119 Type 2 diabetes mellitus without complications: Secondary | ICD-10-CM | POA: Diagnosis not present

## 2023-08-05 DIAGNOSIS — N61 Mastitis without abscess: Secondary | ICD-10-CM | POA: Diagnosis not present

## 2023-08-05 LAB — BASIC METABOLIC PANEL
Anion gap: 7 (ref 5–15)
BUN: 9 mg/dL (ref 6–20)
CO2: 26 mmol/L (ref 22–32)
Calcium: 9 mg/dL (ref 8.9–10.3)
Chloride: 103 mmol/L (ref 98–111)
Creatinine, Ser: 0.76 mg/dL (ref 0.44–1.00)
GFR, Estimated: >60 mL/min (ref >60)
Glucose, Bld: 164 mg/dL — ABNORMAL HIGH (ref 70–99)
Potassium: 4 mmol/L (ref 3.5–5.1)
Sodium: 136 mmol/L (ref 135–145)

## 2023-08-05 LAB — CBC WITH DIFFERENTIAL/PLATELET
Abs Immature Granulocytes: 0.05 10*3/uL (ref 0.00–0.07)
Basophils Absolute: 0 10*3/uL (ref 0.0–0.1)
Basophils Relative: 0 %
Eosinophils Absolute: 0.1 10*3/uL (ref 0.0–0.5)
Eosinophils Relative: 1 %
HCT: 40.7 % (ref 36.0–46.0)
Hemoglobin: 12.5 g/dL (ref 12.0–15.0)
Immature Granulocytes: 1 %
Lymphocytes Relative: 29 %
Lymphs Abs: 3 10*3/uL (ref 0.7–4.0)
MCH: 25.1 pg — ABNORMAL LOW (ref 26.0–34.0)
MCHC: 30.7 g/dL (ref 30.0–36.0)
MCV: 81.6 fL (ref 80.0–100.0)
Monocytes Absolute: 0.6 10*3/uL (ref 0.1–1.0)
Monocytes Relative: 6 %
Neutro Abs: 6.6 10*3/uL (ref 1.7–7.7)
Neutrophils Relative %: 63 %
Platelets: 312 10*3/uL (ref 150–400)
RBC: 4.99 MIL/uL (ref 3.87–5.11)
RDW: 14.5 % (ref 11.5–15.5)
WBC: 10.4 10*3/uL (ref 4.0–10.5)
nRBC: 0 % (ref 0.0–0.2)

## 2023-08-05 NOTE — ED Triage Notes (Signed)
Right breast, discharge Red,tender Became more inflamed Thursday Feeling ill

## 2023-08-06 ENCOUNTER — Emergency Department (HOSPITAL_COMMUNITY)
Admission: EM | Admit: 2023-08-06 | Discharge: 2023-08-06 | Disposition: A | Discharge status: Home / Self Care | Payer: BC Managed Care – PPO | Attending: Emergency Medicine | Admitting: Emergency Medicine

## 2023-08-06 ENCOUNTER — Encounter (HOSPITAL_COMMUNITY): Payer: Self-pay

## 2023-08-06 ENCOUNTER — Telehealth: Payer: Self-pay | Admitting: *Deleted

## 2023-08-06 ENCOUNTER — Other Ambulatory Visit: Payer: Self-pay

## 2023-08-06 ENCOUNTER — Emergency Department (HOSPITAL_BASED_OUTPATIENT_CLINIC_OR_DEPARTMENT_OTHER)
Admission: EM | Admit: 2023-08-06 | Discharge: 2023-08-06 | Disposition: A | Discharge status: Home / Self Care | Payer: BC Managed Care – PPO | Attending: Emergency Medicine | Admitting: Emergency Medicine

## 2023-08-06 DIAGNOSIS — N611 Abscess of the breast and nipple: Secondary | ICD-10-CM | POA: Diagnosis not present

## 2023-08-06 DIAGNOSIS — N61 Mastitis without abscess: Secondary | ICD-10-CM | POA: Insufficient documentation

## 2023-08-06 DIAGNOSIS — J45909 Unspecified asthma, uncomplicated: Secondary | ICD-10-CM | POA: Insufficient documentation

## 2023-08-06 DIAGNOSIS — E119 Type 2 diabetes mellitus without complications: Secondary | ICD-10-CM | POA: Insufficient documentation

## 2023-08-06 MED ORDER — HYDROCODONE-ACETAMINOPHEN 5-325 MG PO TABS: 1.0000 | ORAL_TABLET | ORAL | 0 refills | Status: DC | PRN

## 2023-08-06 MED ORDER — SULFAMETHOXAZOLE-TRIMETHOPRIM 800-160 MG PO TABS: 2.0000 | ORAL_TABLET | Freq: Once | ORAL | Status: DC

## 2023-08-06 MED ORDER — AMOXICILLIN-POT CLAVULANATE 875-125 MG PO TABS: 1.0000 | ORAL_TABLET | Freq: Two times a day (BID) | ORAL | 0 refills | Status: DC

## 2023-08-06 MED ORDER — SULFAMETHOXAZOLE-TRIMETHOPRIM 800-160 MG PO TABS: 1.0000 | ORAL_TABLET | Freq: Once | ORAL | Status: DC

## 2023-08-06 MED ORDER — AMOXICILLIN-POT CLAVULANATE 875-125 MG PO TABS
1.0000 | ORAL_TABLET | Freq: Once | ORAL | Status: AC
  Administered 2023-08-06: 1 via ORAL
  Filled 2023-08-06: qty 1

## 2023-08-06 NOTE — Telephone Encounter (Signed)
Patient called front desk and asked to speak with nurse. Jade Lopez states she was trying to talk with Dr. Alvester Morin because she saw her before for her breast abscess. She states she wants them to cut it out. Per review patient went to [facility] and saw a provider yesterday for her breast abscess. She also went to ER at [facility] during the night / early this am. We discussed she was given an appointment by [facility] for breast center in [place] for Monday to evaluate the abscess. At ER she was given first dose of Augmentin and rx and pain meds.  I informed her we close at noon today and I cannot give her an appointment today since it is already 11am. I also explained Dr. Alvester Morin is not here today and her appointments are filled for next week. I advised she take her antibiotic as prescribed and we discussed that Augmentin is a strong antibotic and hopefully after a few doses it will help with the infection which will help with the pain. I advised she take the Augmentin as ordered and pain med as needed. I advised she keep the Monday appointment and go to ER  of her choice before then if she feels she needs to . She voices understanding. Nancy Fetter

## 2023-08-06 NOTE — Discharge Instructions (Addendum)
You were seen in the emergency room today for right-sided breast abscess.  After ultrasound of the area, I do not feel that incision and drainage in the emergency room would be appropriate.  Please follow-up with the general surgeon for this on Monday - 08/09/2023 10:30 AM   I would recommend continue taking Augmentin.  You can alternate Tylenol and ibuprofen for pain control.  You can also ice the area.  Please return with new or worsening symptoms.

## 2023-08-06 NOTE — ED Provider Notes (Signed)
[name] Provider Note   CSN: [id number] Arrival date & time: 08/05/23  2208     History  Chief Complaint  Patient presents with   Abscess    Jade Lopez is a 27 y.o. female.  The history is provided by the patient.  Abscess She has history of diabetes and recurrent right breast abscess and comes in complaining of an abscess in her right breast which has been present for the last week.  It is painful and swollen.  She denies fever, chills, sweats.  She has been taking doxycycline without any benefit.  She states that it has been drained 3 or 4 times and she actually just wants the entire abscess excised.   Home Medications Prior to Admission medications   Medication Sig Start Date End Date Taking? Authorizing Provider  amoxicillin-clavulanate (AUGMENTIN) 875-125 MG tablet Take 1 tablet by mouth every 12 (twelve) hours. 08/06/23  Yes Dione Booze, MD  HYDROcodone-acetaminophen [name]) 5-325 MG tablet Take 1 tablet by mouth every 4 (four) hours as needed for moderate pain. 08/06/23  Yes Dione Booze, MD  Accu-Chek Softclix Lancets lancets daily. 12/15/22   [provider]  albuterol (VENTOLIN HFA) 108 (90 Base) MCG/ACT inhaler Inhale 1-2 puffs into the lungs every 6 (six) hours as needed for wheezing or shortness of breath. 05/10/23   Marinda Elk, MD  Blood Glucose Monitoring Suppl (MM EASY TOUCH GLUCOSE METER) w/Device KIT Please check your blood sugar daily, in the morning. 12/08/22   Marolyn Haller, MD  Blood Glucose Monitoring Suppl DEVI 1 each by Does not apply route 3 (three) times daily. May dispense any manufacturer covered by patient's insurance. 05/10/23   Marinda Elk, MD  budesonide-formoterol (SYMBICORT) 80-4.5 MCG/ACT inhaler Inhale 2 puffs into the lungs 2 (two) times daily as needed. 05/19/23   Kathleen Lime, MD  busPIRone (BUSPAR) 10 MG tablet Take 3 tablets (30 mg total) by mouth daily. Patient not  taking: Reported on 07/29/2023 11/25/22   Marolyn Haller, MD  busPIRone (BUSPAR) 15 MG tablet Take 15 mg by mouth 2 (two) times daily. 06/22/23   [provider]  celecoxib (CELEBREX) 200 MG capsule Take 1 capsule (200 mg total) by mouth 2 (two) times daily. 07/29/23   Richardean Sale, DO  Dulaglutide (TRULICITY) 0.75 MG/0.5ML SOPN Inject 0.75 mg into the skin once a week. 06/21/23   Alexander-Savino, [place], MD  Glucose Blood (BLOOD GLUCOSE TEST STRIPS) STRP 1 each by Does not apply route 3 (three) times daily. Use as directed to check blood sugar. May dispense any manufacturer covered by patient's insurance and fits patient's device. 05/10/23   Marinda Elk, MD  Lancet Device MISC 1 each by Does not apply route 3 (three) times daily. May dispense any manufacturer covered by patient's insurance. 05/10/23   Marinda Elk, MD  Lancets MISC 1 each by Does not apply route 3 (three) times daily. Use as directed to check blood sugar. May dispense any manufacturer covered by patient's insurance and fits patient's device. 05/10/23   Marinda Elk, MD  medroxyPROGESTERone (DEPO-PROVERA) 150 MG/ML injection Inject 150 mg into the muscle every 3 (three) months.    [provider]  norethindrone (AYGESTIN) 5 MG tablet Take 10 mg by mouth every evening. 12/11/22   [provider]  ondansetron (ZOFRAN-ODT) 8 MG disintegrating tablet Take 1 tablet (8 mg total) by mouth every 8 (eight) hours as needed for nausea or vomiting. 05/17/23   [name],  Hipolito Bayley, NP  Pregabalin (LYRICA PO) Take by mouth. Patient not taking: Reported on 07/29/2023    [provider]  pregabalin (LYRICA) 50 MG capsule Take 1 capsule (50 mg total) by mouth 3 (three) times daily. Patient taking differently: Take 50 mg by mouth 3 (three) times daily. Can only take name brand Lyrica. 05/19/23   Kathleen Lime, MD  promethazine-dextromethorphan (PROMETHAZINE-DM) 6.25-15 MG/5ML syrup Take 5 mLs by mouth 4  (four) times daily as needed for cough. Patient not taking: Reported on 07/29/2023 06/29/23   [name], NP  traZODone (DESYREL) 50 MG tablet Take 1 tablet (50 mg total) by mouth at bedtime. 07/29/23   Richardean Sale, DO      Allergies    Clindamycin/lincomycin, Gabapentin, Other, Chlorpheniramine-phenylephrine, Pregabalin, and Cephalexin    Review of Systems   Review of Systems  All other systems reviewed and are negative.   Physical Exam Updated Vital Signs BP (!) 141/106 (BP Location: Right Arm)   Pulse (!) 112   Temp 98.6 F (37 C) (Oral)   Resp 18   SpO2 100%  Physical Exam Vitals and nursing note reviewed. Exam conducted with a chaperone present.   27 year old female, appears uncomfortable because of pain, but is in no acute distress. Vital signs are significant for elevated blood pressure and slightly elevated heart rate. Oxygen saturation is 100%, which is normal. Head is normocephalic and atraumatic. PERRLA, EOMI.  Lungs are clear without rales, wheezes, or rhonchi. Chest is nontender.  Abscess present on the right breast overlying the medial aspect of the areola.  Abscess is 5 cm in diameter. Heart has regular rate and rhythm without murmur. Abdomen is soft, flat, nontender. Neurologic: Mental status is normal, moves all extremities equally.    ED Results / Procedures / Treatments   Labs (all labs ordered are listed, but only abnormal results are displayed) Labs Reviewed  CBC WITH DIFFERENTIAL/PLATELET - Abnormal; Notable for the following components:      Result Value   MCH 25.1 (*)    All other components within normal limits  BASIC METABOLIC PANEL - Abnormal; Notable for the following components:   Glucose, Bld 164 (*)    All other components within normal limits   Procedures Procedures    Medications Ordered in ED Medications  amoxicillin-clavulanate (AUGMENTIN) 875-125 MG per tablet 1 tablet (has no administration in time range)    ED Course/  Medical Decision Making/ A&P                                 Medical Decision Making Amount and/or Complexity of Data Reviewed Labs: ordered.  Risk Prescription drug management.   Abscess of the right breast which is recurrent.  I have reviewed her laboratory tests, and my interpretation is normal CBC, normal basic metabolic panel except for mildly elevated random glucose level consistent with known history of diabetes.  I am referring her to the breast center for drainage, referring her to general surgery for consideration of surgical excision of cyst.  I have ordered a dose of amoxicillin-clavulanate and I am discharging her with prescription for amoxicillin-clavulanate and hydrocodone-acetaminophen for pain.  I have reviewed her past records, and note ED visit on 05/12/2023 for right breast abscess, also on 04/17/2023 and hospital admission on 03/16/2023 for right breast abscess.  Final Clinical Impression(s) / ED Diagnoses Final diagnoses:  Abscess of right breast  Rx / DC Orders ED Discharge Orders          Ordered    amoxicillin-clavulanate (AUGMENTIN) 875-125 MG tablet  Every 12 hours        08/06/23 0147    HYDROcodone-acetaminophen (NORCO) 5-325 MG tablet  Every 4 hours PRN        08/06/23 0147              Dione Booze, MD 08/06/23 [phone]

## 2023-08-06 NOTE — ED Triage Notes (Signed)
Pt reports with an open abscess to her right breast x 1 week. Pt has swelling, redness, and a small hole to her right breast. Pt reports taking abts currently with no relief.

## 2023-08-06 NOTE — ED Provider Notes (Signed)
[place] EMERGENCY DEPARTMENT AT [facility] Provider Note   CSN: [id number] Arrival date & time: 08/06/23  2013     History {Add pertinent medical, surgical, social history, OB history to HPI:1} Chief Complaint  Patient presents with   Abscess    Jade Lopez is a 27 y.o. female.   Abscess      Home Medications Prior to Admission medications   Medication Sig Start Date End Date Taking? Authorizing Provider  Accu-Chek Softclix Lancets lancets daily. 12/15/22   [provider]  albuterol (VENTOLIN HFA) 108 (90 Base) MCG/ACT inhaler Inhale 1-2 puffs into the lungs every 6 (six) hours as needed for wheezing or shortness of breath. 05/10/23   Marinda Elk, MD  amoxicillin-clavulanate (AUGMENTIN) 875-125 MG tablet Take 1 tablet by mouth every 12 (twelve) hours. 08/06/23   Dione Booze, MD  Blood Glucose Monitoring Suppl (MM EASY TOUCH GLUCOSE METER) w/Device KIT Please check your blood sugar daily, in the morning. 12/08/22   Marolyn Haller, MD  Blood Glucose Monitoring Suppl DEVI 1 each by Does not apply route 3 (three) times daily. May dispense any manufacturer covered by patient's insurance. 05/10/23   Marinda Elk, MD  budesonide-formoterol (SYMBICORT) 80-4.5 MCG/ACT inhaler Inhale 2 puffs into the lungs 2 (two) times daily as needed. 05/19/23   Kathleen Lime, MD  busPIRone (BUSPAR) 10 MG tablet Take 3 tablets (30 mg total) by mouth daily. Patient not taking: Reported on 07/29/2023 11/25/22   Marolyn Haller, MD  busPIRone (BUSPAR) 15 MG tablet Take 15 mg by mouth 2 (two) times daily. 06/22/23   [provider]  celecoxib (CELEBREX) 200 MG capsule Take 1 capsule (200 mg total) by mouth 2 (two) times daily. 07/29/23   Richardean Sale, DO  Dulaglutide (TRULICITY) 0.75 MG/0.5ML SOPN Inject 0.75 mg into the skin once a week. 06/21/23   Alexander-Savino, [place], MD  Glucose Blood (BLOOD GLUCOSE TEST STRIPS) STRP 1 each by Does not apply route  3 (three) times daily. Use as directed to check blood sugar. May dispense any manufacturer covered by patient's insurance and fits patient's device. 05/10/23   Marinda Elk, MD  HYDROcodone-acetaminophen (NORCO) 5-325 MG tablet Take 1 tablet by mouth every 4 (four) hours as needed for moderate pain. 08/06/23   Dione Booze, MD  Lancet Device MISC 1 each by Does not apply route 3 (three) times daily. May dispense any manufacturer covered by patient's insurance. 05/10/23   Marinda Elk, MD  Lancets MISC 1 each by Does not apply route 3 (three) times daily. Use as directed to check blood sugar. May dispense any manufacturer covered by patient's insurance and fits patient's device. 05/10/23   Marinda Elk, MD  medroxyPROGESTERone (DEPO-PROVERA) 150 MG/ML injection Inject 150 mg into the muscle every 3 (three) months.    [provider]  norethindrone (AYGESTIN) 5 MG tablet Take 10 mg by mouth every evening. 12/11/22   [provider]  ondansetron (ZOFRAN-ODT) 8 MG disintegrating tablet Take 1 tablet (8 mg total) by mouth every 8 (eight) hours as needed for nausea or vomiting. 05/17/23   [name], NP  Pregabalin (LYRICA PO) Take by mouth. Patient not taking: Reported on 07/29/2023    [provider]  pregabalin (LYRICA) 50 MG capsule Take 1 capsule (50 mg total) by mouth 3 (three) times daily. Patient taking differently: Take 50 mg by mouth 3 (three) times daily. Can only take name brand Lyrica. 05/19/23   Kathleen Lime, MD  promethazine-dextromethorphan (PROMETHAZINE-DM) 6.25-15 MG/5ML syrup Take 5 mLs by mouth 4 (four) times daily as needed for cough. Patient not taking: Reported on 07/29/2023 06/29/23   [name], NP  traZODone (DESYREL) 50 MG tablet Take 1 tablet (50 mg total) by mouth at bedtime. 07/29/23   Richardean Sale, DO      Allergies    Clindamycin/lincomycin, Gabapentin, Other, Chlorpheniramine-phenylephrine, Pregabalin, and Cephalexin     Review of Systems   Review of Systems  Physical Exam Updated Vital Signs BP (!) 152/97   Pulse (!) 116   Temp 98.6 F (37 C) (Oral)   Resp 20   SpO2 99%  Physical Exam  ED Results / Procedures / Treatments   Labs (all labs ordered are listed, but only abnormal results are displayed) Labs Reviewed  CBC WITH DIFFERENTIAL/PLATELET  COMPREHENSIVE METABOLIC PANEL    EKG None  Radiology No results found.  Procedures Procedures  {Document cardiac monitor, telemetry assessment procedure when appropriate:1}  Medications Ordered in ED Medications - No data to display  ED Course/ Medical Decision Making/ A&P   {   Click here for ABCD2, HEART and other calculatorsREFRESH Note before signing :1}                              Medical Decision Making Amount and/or Complexity of Data Reviewed Labs: ordered.   ***  {Document critical care time when appropriate:1} {Document review of labs and clinical decision tools ie heart score, Chads2Vasc2 etc:1}  {Document your independent review of radiology images, and any outside records:1} {Document your discussion with family members, caretakers, and with consultants:1} {Document social determinants of health affecting pt's care:1} {Document your decision making why or why not admission, treatments were needed:1} Final Clinical Impression(s) / ED Diagnoses Final diagnoses:  None    Rx / DC Orders ED Discharge Orders     None

## 2023-08-06 NOTE — Discharge Instructions (Signed)
Go to the breast center in the morning.  They should be able to drain the abscess.  This will give you some immediate relief of pain.  Also, follow-up with the surgery office to discuss with a surgeon whether the cyst that keeps getting infected can be removed.

## 2023-08-06 NOTE — ED Notes (Signed)
Discharge instructions discussed with pt. Pt verbalized understanding. Pt stable and ambulatory.  °

## 2023-08-09 DIAGNOSIS — N611 Abscess of the breast and nipple: Secondary | ICD-10-CM | POA: Diagnosis not present

## 2023-08-11 NOTE — Progress Notes (Unsigned)
Jade Lopez D.[name] [address] [place] [address] Phone: [phone]  Assessment and Plan:     There are no diagnoses linked to this encounter.  ***    Date of injury was 8/24 and 07/05/2023. Symptom severity scores of *** and *** today. Original symptom severity scores were 22 and 86. The patient was counseled on the nature of the injury, typical course and potential options for further evaluation and treatment. Discussed the importance of compliance with recommendations. Patient stated understanding of this plan and willingness to comply.  Recommendations:  -  Relative mental and physical rest for 48 hours after concussive event - Recommend light aerobic activity while keeping symptoms less than 3/10 - Stop mental or physical activities that cause symptoms to worsen greater than 3/10, and wait 24 hours before attempting them again - Eliminate screen time as much as possible for first 48 hours after concussive event, then continue limited screen time (recommend less than 2 hours per day)   - Encouraged to RTC in *** for reassessment or sooner for any concerns or acute changes   Pertinent previous records reviewed include ***   Time of visit *** minutes, which included chart review, physical exam, treatment plan, symptom severity score, VOMS, and tandem gait testing being performed, interpreted, and discussed with patient at today's visit.   Subjective:   I, [name], am serving as a Neurosurgeon for Doctor Richardean Sale   Chief Complaint: concussion symptoms    HPI:    07/29/23 Patient is a 27 year old female complaining of concussion symptoms. Patient states third encounter after hitting her had on a doorframe on 8/24, then hitting her head on a desk on 8/26. Patient reports continued symptoms since last visit with only mild improvement. She has a chronic headache with intermittent periods of more severe pain. Tylenol and ibuprofen are  only moderately helpful. She states she has memory loss such as forgetting which day it is, intermittent blurry vision, some nausea with no vomiting, occasional lightheadedness.   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 No Diagnosed/treated for headache disorder, migraines, or seizures?yes  Diagnosed with learning disability Jade Lopez? Yes dyslexia  Diagnosed with ADD/ADHD? no Diagnose with Depression, anxiety, or other Psychiatric Disorder? Yes    Current medications:  Current Outpatient Medications  Medication Sig Dispense Refill   Accu-Chek Softclix Lancets lancets daily.     albuterol (VENTOLIN HFA) 108 (90 Base) MCG/ACT inhaler Inhale 1-2 puffs into the lungs every 6 (six) hours as needed for wheezing or shortness of breath. 1 each 1   amoxicillin-clavulanate (AUGMENTIN) 875-125 MG tablet Take 1 tablet by mouth every 12 (twelve) hours. 14 tablet 0   Blood Glucose Monitoring Suppl (MM EASY TOUCH GLUCOSE METER) w/Device KIT Please check your blood sugar daily, in the morning. 1 kit 0   Blood Glucose Monitoring Suppl DEVI 1 each by Does not apply route 3 (three) times daily. May dispense any manufacturer covered by patient's insurance. 1 each 0   budesonide-formoterol (SYMBICORT) 80-4.5 MCG/ACT inhaler Inhale 2 puffs into the lungs 2 (  two) times daily as needed. 1 each 12   busPIRone (BUSPAR) 10 MG tablet Take 3 tablets (30 mg total) by mouth daily. (Patient not taking: Reported on 07/29/2023) 30 tablet 5   busPIRone (BUSPAR) 15 MG tablet Take 15 mg by mouth 2 (two) times daily.     celecoxib (CELEBREX) 200 MG capsule Take 1 capsule (200 mg total)  by mouth 2 (two) times daily. 42 capsule 0   Dulaglutide (TRULICITY) 0.75 MG/0.5ML SOPN Inject 0.75 mg into the skin once a week. 2 mL 1   Glucose Blood (BLOOD GLUCOSE TEST STRIPS) STRP 1 each by Does not apply route 3 (three) times daily. Use as directed to check blood sugar. May dispense any manufacturer covered by patient's insurance and fits patient's device. 100 strip 0   HYDROcodone-acetaminophen (NORCO) 5-325 MG tablet Take 1 tablet by mouth every 4 (four) hours as needed for moderate pain. 10 tablet 0   Lancet Device MISC 1 each by Does not apply route 3 (three) times daily. May dispense any manufacturer covered by patient's insurance. 1 each 0   Lancets MISC 1 each by Does not apply route 3 (three) times daily. Use as directed to check blood sugar. May dispense any manufacturer covered by patient's insurance and fits patient's device. 100 each 0   medroxyPROGESTERone (DEPO-PROVERA) 150 MG/ML injection Inject 150 mg into the muscle every 3 (three) months.     norethindrone (AYGESTIN) 5 MG tablet Take 10 mg by mouth every evening.     ondansetron (ZOFRAN-ODT) 8 MG disintegrating tablet Take 1 tablet (8 mg total) by mouth every 8 (eight) hours as needed for nausea or vomiting. 20 tablet 0   Pregabalin (LYRICA PO) Take by mouth. (Patient not taking: Reported on 07/29/2023)     pregabalin (LYRICA) 50 MG capsule Take 1 capsule (50 mg total) by mouth 3 (three) times daily. (Patient taking differently: Take 50 mg by mouth 3 (three) times daily. Can only take name brand Lyrica.) 90 capsule 3   promethazine-dextromethorphan (PROMETHAZINE-DM) 6.25-15 MG/5ML syrup Take 5 mLs by mouth 4 (four) times daily as needed for cough. (Patient not taking: Reported on 07/29/2023) 118 mL 0   traZODone (DESYREL) 50 MG tablet Take 1 tablet (50 mg total) by mouth at bedtime. 30 tablet 0   No current facility-administered medications for this visit.      Objective:     There were no vitals filed for this visit.     There is no height or weight on file to calculate BMI.    Physical Exam:     General: Well-appearing, cooperative, sitting comfortably in no acute distress.  Psychiatric: Mood and affect are appropriate.   Neuro:sensation intact and strength 5/5 with no deficits, no atrophy, normal muscle tone   Today's Symptom Severity Score:  Scores: 0-6  Headache:*** "Pressure in head":***  Neck Pain:*** Nausea or vomiting:*** Dizziness:*** Blurred vision:*** Balance problems:*** Sensitivity to light:*** Sensitivity to noise:*** Feeling slowed down:*** Feeling like "in a fog":*** "Don't feel right":*** Difficulty concentrating:*** Difficulty remembering:***  Fatigue or low energy:*** Confusion:***  Drowsiness:***  More emotional:*** Irritability:*** Sadness:***  Nervous or Anxious:*** Trouble falling or staying asleep:***  Total number of symptoms: ***/22  Symptom Severity index: ***/132  Worse with physical activity? No*** Worse with mental activity? No*** Percent improved since injury: ***%    Full pain-free cervical PROM: yes***    Cognitive:  - Months backwards: *** Mistakes. *** seconds  mVOMS:   - Baseline symptoms: *** - Horizontal Vestibular-Ocular  Reflex: ***/10  - Smooth pursuits: ***/10  - Horizontal Saccades:  ***/10  - Visual Motion Sensitivity Test:  ***/10  - Convergence: ***cm (<5 cm normal)    Autonomic:  - Symptomatic with supine to standing: No***  Complex Tandem Gait: - Forward, eyes open: *** errors - Backward, eyes open: *** errors - Forward, eyes closed: *** errors - Backward, eyes closed: *** errors  Electronically signed by:  Jade Lopez D.[name] 7:47 AM 08/11/23

## 2023-08-12 ENCOUNTER — Ambulatory Visit: Payer: BC Managed Care – PPO | Admitting: Sports Medicine

## 2023-08-12 VITALS — HR 89 | Ht 64.0 in | Wt 397.0 lb

## 2023-08-12 DIAGNOSIS — G44319 Acute post-traumatic headache, not intractable: Secondary | ICD-10-CM

## 2023-08-12 DIAGNOSIS — G4700 Insomnia, unspecified: Secondary | ICD-10-CM | POA: Diagnosis not present

## 2023-08-12 DIAGNOSIS — S060X0D Concussion without loss of consciousness, subsequent encounter: Secondary | ICD-10-CM | POA: Diagnosis not present

## 2023-08-12 NOTE — Patient Instructions (Addendum)
Increase trazodone to 200 mg nightly if that is not affective after 1 week can increase to 300 mg a night Do not take trazodone as the same time as pain medication Work note provided can return to work 08/16/2023 4 hours a day maximum for 2 weeks . Can increase to  6 hours maximum for 2 weeks. 15 minute rest breaks every hour as needed for symptoms  4 week follow up

## 2023-08-18 ENCOUNTER — Encounter: Payer: BC Managed Care – PPO | Admitting: Internal Medicine

## 2023-08-19 DIAGNOSIS — R92321 Mammographic fibroglandular density, right breast: Secondary | ICD-10-CM | POA: Diagnosis not present

## 2023-08-19 DIAGNOSIS — L538 Other specified erythematous conditions: Secondary | ICD-10-CM | POA: Diagnosis not present

## 2023-08-19 DIAGNOSIS — N611 Abscess of the breast and nipple: Secondary | ICD-10-CM | POA: Diagnosis not present

## 2023-08-23 ENCOUNTER — Ambulatory Visit (HOSPITAL_COMMUNITY)
Admission: EM | Admit: 2023-08-23 | Discharge: 2023-08-23 | Disposition: A | Discharge status: Home / Self Care | Payer: BC Managed Care – PPO | Attending: Internal Medicine | Admitting: Internal Medicine

## 2023-08-23 ENCOUNTER — Encounter (HOSPITAL_COMMUNITY): Payer: Self-pay

## 2023-08-23 DIAGNOSIS — Z113 Encounter for screening for infections with a predominantly sexual mode of transmission: Secondary | ICD-10-CM | POA: Diagnosis not present

## 2023-08-23 NOTE — ED Triage Notes (Signed)
Patient here today with c/o vaginal pain after having unprotected intercourse on Wednesday. Patient would like to be tested for all STDs.

## 2023-08-23 NOTE — Discharge Instructions (Addendum)
Test results will be released to your MyChart account Contact you if anything is positive and requires treatment

## 2023-08-23 NOTE — ED Provider Notes (Signed)
[facility]    CSN: [id number] Arrival date & time: 08/23/23  1214      History   Chief Complaint Chief Complaint  Patient presents with   SEXUALLY TRANSMITTED DISEASE    HPI Jade Lopez is a 27 y.o. female.   HPI Had unprotected sex 5 days ago wants to be checked for STI.  Admits discomfort and minimal bleeding during intercourse nonsense.  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   T2DM (type 2 diabetes mellitus) (HCC)    TOA (tubo-ovarian abscess)     Patient Active Problem List   Diagnosis Date Noted   Abnormal laboratory test result 07/29/2023   Traumatic brain injury (HCC) 07/14/2023   History of nausea and vomiting 05/19/2023   Fibromyalgia 04/11/2023   Abscess of right breast 04/03/2023   Abdominal wall pain 03/01/2023   Sleep related headaches 02/24/2023   OSA on CPAP 02/24/2023   Sleeps in sitting position due to orthopnea 02/24/2023   Menorrhagia 01/28/2023   Healthcare maintenance 01/28/2023   Chest pain 01/28/2023   Back pain 01/28/2023   Type 2 diabetes mellitus (HCC) 11/28/2022   History of systemic lupus erythematosus (HCC) 11/28/2022   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/28/2022   Endometriosis 11/28/2022   Anxiety 11/28/2022   Vitamin D deficiency 09/13/2012   Asthma 08/22/2012    Past Surgical History:  Procedure Laterality Date   CHOLECYSTECTOMY     DILATATION AND CURETTAGE/HYSTEROSCOPY WITH MINERVA     3 total   TONSILLECTOMY     TONSILLECTOMY AND ADENOIDECTOMY      OB History     Gravida  0   Para  0   Term  0   Preterm  0   AB  0   Living  0      SAB  0   IAB  0    Ectopic  0   Multiple  0   Live Births  0            Home Medications    Prior to Admission medications   Medication Sig Start Date End Date Taking? Authorizing Provider  tirzepatide [facility]) 5 MG/0.5ML Pen Inject 5 mg into the skin once a week. 08/05/23  Yes [provider]  Accu-Chek Softclix Lancets lancets daily. 12/15/22   [provider]  albuterol (VENTOLIN HFA) 108 (90 Base) MCG/ACT inhaler Inhale 1-2 puffs into the lungs every 6 (six) hours as needed for wheezing or shortness of breath. 05/10/23   Marinda Elk, MD  Blood Glucose Monitoring Suppl (MM EASY TOUCH GLUCOSE METER) w/Device KIT Please check your blood sugar daily, in the morning. 12/08/22   Marolyn Haller, MD  Blood Glucose Monitoring Suppl DEVI 1 each by Does not apply route 3 (three) times daily. May dispense any manufacturer covered by patient's insurance. 05/10/23   Marinda Elk, MD  budesonide-formoterol (SYMBICORT) 80-4.5 MCG/ACT inhaler Inhale 2 puffs into the lungs 2 (two) times daily as needed. 05/19/23   Kathleen Lime, MD  busPIRone (BUSPAR) 10 MG tablet Take 3 tablets (30 mg total) by mouth  daily. Patient not taking: Reported on 07/29/2023 11/25/22   Marolyn Haller, MD  busPIRone (BUSPAR) 15 MG tablet Take 15 mg by mouth 2 (two) times daily. 06/22/23   [provider]  Glucose Blood (BLOOD GLUCOSE TEST STRIPS) STRP 1 each by Does not apply route 3 (three) times daily. Use as directed to check blood sugar. May dispense any manufacturer covered by patient's insurance and fits patient's device. 05/10/23   Marinda Elk, MD  Lancet Device MISC 1 each by Does not apply route 3 (three) times daily. May dispense any manufacturer covered by patient's insurance. 05/10/23   Marinda Elk, MD  Lancets MISC 1 each by Does not apply route 3 (three) times daily. Use as directed to check blood sugar. May dispense any manufacturer covered by patient's insurance and fits  patient's device. 05/10/23   Marinda Elk, MD  medroxyPROGESTERone (DEPO-PROVERA) 150 MG/ML injection Inject 150 mg into the muscle every 3 (three) months.    [provider]  norethindrone (AYGESTIN) 5 MG tablet Take 10 mg by mouth every evening. 12/11/22   [provider]  ondansetron (ZOFRAN-ODT) 8 MG disintegrating tablet Take 1 tablet (8 mg total) by mouth every 8 (eight) hours as needed for nausea or vomiting. 05/17/23   [name], NP  pregabalin (LYRICA) 50 MG capsule Take 1 capsule (50 mg total) by mouth 3 (three) times daily. Patient taking differently: Take 50 mg by mouth 3 (three) times daily. Can only take name brand Lyrica. 05/19/23   Kathleen Lime, MD  traZODone (DESYREL) 50 MG tablet Take 1 tablet (50 mg total) by mouth at bedtime. 07/29/23   Richardean Sale, DO    Family History Family History  Problem Relation Age of Onset   Diabetes Mother    Hypertension Mother    Diabetes Father    Hypertension Father    Sleep apnea Father     Social History Social History   Tobacco Use   Smoking status: Never    Passive exposure: Never   Smokeless tobacco: Never  Vaping Use   Vaping status: Never Used  Substance Use Topics   Alcohol use: Yes    Comment: occ   Drug use: No     Allergies   Clindamycin/lincomycin, Gabapentin, Other, Chlorpheniramine-phenylephrine, Pregabalin, and Cephalexin   Review of Systems Review of Systems  Genitourinary:  Positive for vaginal bleeding (during intercourse) and vaginal pain (during intercourse). Negative for dysuria, frequency, genital sores, hematuria and vaginal discharge.  Skin:  Negative for rash and wound.     Physical Exam Triage Vital Signs ED Triage Vitals [08/23/23 1300]  Encounter Vitals Group     BP 126/82     Systolic BP Percentile      Diastolic BP Percentile      Pulse Rate 94     Resp 16     Temp 98.3 F (36.8 C)     Temp Source Oral     SpO2 97 %     Weight (!) 396 lb (179.6 kg)      Height 5' 4.5" (1.638 m)     Head Circumference      Peak Flow      Pain Score 2     Pain Loc      Pain Education      Exclude from Growth Chart    No data found.  Updated Vital Signs BP 126/82 (BP Location: Right Arm)   Pulse 94   Temp 98.3 F (36.8  C) (Oral)   Resp 16   Ht 5' 4.5" (1.638 m)   Wt (!) 396 lb (179.6 kg)   LMP  (LMP Unknown)   SpO2 97%   BMI 66.92 kg/m   Visual Acuity Right Eye Distance:   Left Eye Distance:   Bilateral Distance:    Right Eye Near:   Left Eye Near:    Bilateral Near:     Physical Exam Vitals and nursing note reviewed. Exam conducted with a chaperone present.  HENT:     Head: Atraumatic.  Cardiovascular:     Rate and Rhythm: Normal rate.  Pulmonary:     Effort: Pulmonary effort is normal.  Genitourinary:    Exam position: Lithotomy position.     Pubic Area: No rash.      Labia:        Right: No rash, tenderness, lesion or injury.        Left: No rash, tenderness, lesion or injury.      Vagina: Normal.     Cervix: Normal. No cervical bleeding.  Skin:    General: Skin is warm and dry.  Neurological:     Mental Status: She is alert.      UC Treatments / Results  Labs (all labs ordered are listed, but only abnormal results are displayed) Labs Reviewed  CERVICOVAGINAL ANCILLARY ONLY    EKG   Radiology No results found.  Procedures Procedures (including critical care time)  Medications Ordered in UC Medications - No data to display  Initial Impression / Assessment and Plan / UC Course  I have reviewed the triage vital signs and the nursing notes.  Pertinent labs & imaging results that were available during my care of the patient were reviewed by me and considered in my medical decision making (see chart for details).     27 year old female recent unprotected sex wants to be checked for STIs.  Had discomfort and scant bleeding during intercourse but none since.  No discharge. Normal vaginal exam.  Will  send cytology swab  Final Clinical Impressions(s) / UC Diagnoses   Final diagnoses:  None   Discharge Instructions   None    ED Prescriptions   None    PDMP not reviewed this encounter.   Meliton Rattan, [place] 08/23/23 1337

## 2023-08-24 LAB — CERVICOVAGINAL ANCILLARY ONLY
Bacterial Vaginitis (gardnerella): POSITIVE — AB
Candida Glabrata: NEGATIVE
Candida Vaginitis: POSITIVE — AB
Chlamydia: NEGATIVE
Comment: NEGATIVE
Comment: NEGATIVE
Comment: NEGATIVE
Comment: NEGATIVE
Comment: NEGATIVE
Comment: NORMAL
Neisseria Gonorrhea: NEGATIVE
Trichomonas: NEGATIVE

## 2023-08-25 ENCOUNTER — Telehealth: Payer: Self-pay | Admitting: Sports Medicine

## 2023-08-25 ENCOUNTER — Telehealth: Payer: Self-pay

## 2023-08-25 MED ORDER — METRONIDAZOLE 500 MG PO TABS: 500.0000 mg | ORAL_TABLET | Freq: Two times a day (BID) | ORAL | 0 refills | Status: AC

## 2023-08-25 MED ORDER — FLUCONAZOLE 150 MG PO TABS: 150.0000 mg | ORAL_TABLET | Freq: Once | ORAL | 0 refills | Status: AC

## 2023-08-25 NOTE — Telephone Encounter (Signed)
Per protocol, pt requires tx with metronidazole and Diflucan. Reviewed with patient, verified pharmacy, prescription sent.

## 2023-08-25 NOTE — Telephone Encounter (Signed)
Patient called requesting a refill on: traZODone (DESYREL) 50 MG tablet   Pharmacy: [facility] - [address] [facility]   She said that it was discussed previously about sending in a refill but nothing was sent?

## 2023-08-26 ENCOUNTER — Other Ambulatory Visit: Payer: Self-pay | Admitting: Sports Medicine

## 2023-08-26 MED ORDER — TRAZODONE HCL 50 MG PO TABS: 50.0000 mg | ORAL_TABLET | Freq: Every day | ORAL | 0 refills | Status: DC

## 2023-08-26 NOTE — Progress Notes (Unsigned)
Refill placed

## 2023-08-26 NOTE — Progress Notes (Deleted)
Aleen Sells D.[name] [address] [place] [address] Phone: [phone]  Assessment and Plan:     There are no diagnoses linked to this encounter.  ***    Date of injury was 8/24 and 07/05/2023. Symptom severity scores of *** and *** today. Original symptom severity scores were 22 and 86. The patient was counseled on the nature of the injury, typical course and potential options for further evaluation and treatment. Discussed the importance of compliance with recommendations. Patient stated understanding of this plan and willingness to comply.  Recommendations:  -  Relative mental and physical rest for 48 hours after concussive event - Recommend light aerobic activity while keeping symptoms less than 3/10 - Stop mental or physical activities that cause symptoms to worsen greater than 3/10, and wait 24 hours before attempting them again - Eliminate screen time as much as possible for first 48 hours after concussive event, then continue limited screen time (recommend less than 2 hours per day)   - Encouraged to RTC in *** for reassessment or sooner for any concerns or acute changes   Pertinent previous records reviewed include ***   Time of visit *** minutes, which included chart review, physical exam, treatment plan, symptom severity score, VOMS, and tandem gait testing being performed, interpreted, and discussed with patient at today's visit.   Subjective:   I, [name], am serving as a Neurosurgeon for Doctor Richardean Sale   Chief Complaint: concussion symptoms    HPI:    07/29/23 Patient is a 27 year old female complaining of concussion symptoms. Patient states third encounter after hitting her had on a doorframe on 8/24, then hitting her head on a desk on 8/26. Patient reports continued symptoms since last visit with only mild improvement. She has a chronic headache with intermittent periods of more severe pain. Tylenol and ibuprofen are  only moderately helpful. She states she has memory loss such as forgetting which day it is, intermittent blurry vision, some nausea with no vomiting, occasional lightheadedness.    08/12/2023 Patient states she has been really stressed with life . Has been having to adjust to medications . Trazodone hasn't really helped out much with sleep only about 2 days of 7-8 hours of sleep interrupted.    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 No Diagnosed/treated for headache disorder, migraines, or seizures?yes  Diagnosed with learning disability [name]? Yes dyslexia  Diagnosed with ADD/ADHD? no Diagnose with Depression, anxiety, or other Psychiatric Disorder? Yes  Current medications:  Current Outpatient Medications  Medication Sig Dispense Refill   Accu-Chek Softclix Lancets lancets daily.     albuterol (VENTOLIN HFA) 108 (90 Base) MCG/ACT inhaler Inhale 1-2 puffs into the lungs every 6 (six) hours as needed for wheezing or shortness of breath. 1 each 1   Blood Glucose Monitoring Suppl (MM EASY TOUCH GLUCOSE METER) w/Device KIT Please check your blood sugar daily, in the morning. 1 kit 0   Blood Glucose Monitoring Suppl DEVI 1 each by Does not apply route 3 (three) times daily. May dispense any manufacturer covered by  patient's insurance. 1 each 0   budesonide-formoterol (SYMBICORT) 80-4.5 MCG/ACT inhaler Inhale 2 puffs into the lungs 2 (two) times daily as needed. 1 each 12   busPIRone (BUSPAR) 10 MG tablet Take 3 tablets (30 mg total) by mouth daily. (Patient not taking: Reported on 07/29/2023) 30 tablet 5   busPIRone (BUSPAR) 15 MG tablet Take 15 mg by  mouth 2 (two) times daily.     Glucose Blood (BLOOD GLUCOSE TEST STRIPS) STRP 1 each by Does not apply route 3 (three) times daily. Use as directed to check blood sugar. May dispense any manufacturer covered by patient's insurance and fits patient's device. 100 strip 0   Lancet Device MISC 1 each by Does not apply route 3 (three) times daily. May dispense any manufacturer covered by patient's insurance. 1 each 0   Lancets MISC 1 each by Does not apply route 3 (three) times daily. Use as directed to check blood sugar. May dispense any manufacturer covered by patient's insurance and fits patient's device. 100 each 0   medroxyPROGESTERone (DEPO-PROVERA) 150 MG/ML injection Inject 150 mg into the muscle every 3 (three) months.     metroNIDAZOLE (FLAGYL) 500 MG tablet Take 1 tablet (500 mg total) by mouth 2 (two) times daily for 7 days. 14 tablet 0   norethindrone (AYGESTIN) 5 MG tablet Take 10 mg by mouth every evening.     ondansetron (ZOFRAN-ODT) 8 MG disintegrating tablet Take 1 tablet (8 mg total) by mouth every 8 (eight) hours as needed for nausea or vomiting. 20 tablet 0   pregabalin (LYRICA) 50 MG capsule Take 1 capsule (50 mg total) by mouth 3 (three) times daily. (Patient taking differently: Take 50 mg by mouth 3 (three) times daily. Can only take name brand Lyrica.) 90 capsule 3   tirzepatide (MOUNJARO) 5 MG/0.5ML Pen Inject 5 mg into the skin once a week.     traZODone (DESYREL) 50 MG tablet Take 1 tablet (50 mg total) by mouth at bedtime. 30 tablet 0   No current facility-administered medications for this visit.      Objective:     There were no vitals filed for this visit.    There is no height or weight on file to calculate BMI.    Physical Exam:     General: Well-appearing, cooperative, sitting comfortably in no acute distress.  Psychiatric: Mood and affect are appropriate.   Neuro:sensation intact and strength 5/5 with no deficits, no atrophy, normal muscle tone   Today's  Symptom Severity Score:  Scores: 0-6  Headache:*** "Pressure in head":***  Neck Pain:*** Nausea or vomiting:*** Dizziness:*** Blurred vision:*** Balance problems:*** Sensitivity to light:*** Sensitivity to noise:*** Feeling slowed down:*** Feeling like "in a fog":*** "Don't feel right":*** Difficulty concentrating:*** Difficulty remembering:***  Fatigue or low energy:*** Confusion:***  Drowsiness:***  More emotional:*** Irritability:*** Sadness:***  Nervous or Anxious:*** Trouble falling or staying asleep:***  Total number of symptoms: ***/22  Symptom Severity index: ***/132  Worse with physical activity? No*** Worse with mental activity? No*** Percent improved since injury: ***%    Full pain-free cervical PROM: yes***    Cognitive:  - Months backwards: *** Mistakes. *** seconds  mVOMS:   - Baseline symptoms: *** - Horizontal Vestibular-Ocular Reflex: ***/10  - Smooth pursuits: ***/10  - Horizontal Saccades:  ***/10  - Visual Motion Sensitivity Test:  ***/10  - Convergence: ***cm (<5 cm normal)    Autonomic:  - Symptomatic with supine to standing: No***  Complex Tandem Gait: - Forward, eyes open: ***  errors - Backward, eyes open: *** errors - Forward, eyes closed: *** errors - Backward, eyes closed: *** errors  Electronically signed by:  Aleen Sells [name] 8:54 AM 08/26/23

## 2023-08-26 NOTE — Telephone Encounter (Signed)
Refill placed

## 2023-08-29 NOTE — Progress Notes (Signed)
Office Visit Note  Patient: Jade Lopez             Date of Birth: 1996/11/02           MRN: [id number]             PCP: Manuela Neptune, MD Referring: Thomasene Ripple* Visit Date: 08/30/2023   Subjective:  Follow-up   History of Present Illness: Jade Lopez is a 27 y.o. female here for follow up and with history of lupus.  Workup at our initial visit was negative for any disease specific antibodies but did have moderately elevated sedimentation rate and CRP.  Still having a lot of body pains and stiffness not localized very well to specific joints.  Still seeing leg swelling worse in the evening.  Worsening of the right breast cysts not diagnosed with abscess for which it sounds like she is going to need some drainage or debridement.  Previous HPI 07/29/23 Jade Lopez is a 27 y.o. female here for evaluation and management with history of lupus.  She was not seeing any rheumatologist previously had been managed through primary care office with prolonged oral prednisone treatment.  From her history sounds like original diagnosis was based on joint inflammation and abnormal serology.  And more recently reviewed notes may have also experienced a course of intermittent fevers without clear underlying cause.  Also previous diagnosis of fibromyalgia syndrome and takes Lyrica 50 mg 3 times daily.  Currently has joint pain in multiple areas mostly worse on her right side throughout upper extremity and in the right knee.  She previously fractured the right ankle does not have as much pain in the site as an others.  She gets swelling in multiple areas legs are worse by the evening time. Besides joint pain multiple areas she has some frequent or recurrent skin lesions sounds like cysts or small abscesses in the axilla groin and breast areas.  Saw dermatology evaluation at some point and found to have some type of dermatitis but was not diagnosed as hidradenitis. Has chronic eye  and mouth dryness not associated with dental complications vision change or eye inflammation.  Reports some hair loss or thinning not associated with any scalp rash or specific distribution. She does have chronic fatigue, concentration difficulty, dizziness, headaches, chronic diarrhea.   12/2022 ANA neg dsDNA neg  Review of Systems  Constitutional:  Positive for fatigue.  HENT:  Positive for mouth dryness. Negative for mouth sores.   Eyes:  Positive for dryness.  Respiratory:  Positive for shortness of breath.   Cardiovascular:  Positive for chest pain. Negative for palpitations.  Gastrointestinal:  Negative for blood in stool, constipation and diarrhea.  Endocrine: Negative for increased urination.  Genitourinary:  Negative for involuntary urination.  Musculoskeletal:  Positive for joint pain, joint pain, joint swelling, myalgias, muscle weakness, morning stiffness, muscle tenderness and myalgias. Negative for gait problem.  Skin:  Positive for color change and sensitivity to sunlight. Negative for rash and hair loss.  Allergic/Immunologic: Positive for susceptible to infections.  Neurological:  Negative for dizziness and headaches.  Hematological:  Negative for swollen glands.  Psychiatric/Behavioral:  Positive for depressed mood and sleep disturbance. The patient is nervous/anxious.     PMFS History:  Patient Active Problem List   Diagnosis Date Noted   Abnormal laboratory test result 07/29/2023   Traumatic brain injury (HCC) 07/14/2023   History of nausea and vomiting 05/19/2023   Fibromyalgia 04/11/2023   Abscess of right breast 04/03/2023  Abdominal wall pain 03/01/2023   Sleep related headaches 02/24/2023   OSA on CPAP 02/24/2023   Sleeps in sitting position due to orthopnea 02/24/2023   Menorrhagia 01/28/2023   Healthcare maintenance 01/28/2023   Chest pain 01/28/2023   Back pain 01/28/2023   Type 2 diabetes mellitus (HCC) 11/28/2022   History of systemic lupus  erythematosus (HCC) 11/28/2022   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/28/2022   Endometriosis 11/28/2022   Anxiety 11/28/2022   Vitamin D deficiency 09/13/2012   Asthma 08/22/2012    Past Medical History:  Diagnosis Date   Asthma    Autoimmune disease (HCC)    Blood clots in brain 2020   Class 3 obesity    Endometriosis    Endometritis    Fibromyalgia    Near syncope 05/09/2023   Sleep apnea    Sleep related headaches 02/24/2023   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   T2DM (type 2 diabetes mellitus) (HCC)    TOA (tubo-ovarian abscess)     Family History  Problem Relation Age of Onset   Diabetes Mother    Hypertension Mother    Diabetes Father    Hypertension Father    Sleep apnea Father    Past Surgical History:  Procedure Laterality Date   CHOLECYSTECTOMY     DILATATION AND CURETTAGE/HYSTEROSCOPY WITH MINERVA     3 total   TONSILLECTOMY     TONSILLECTOMY AND ADENOIDECTOMY     Social History   Social History Narrative   Not on file   Immunization History  Administered Date(s) Administered   Influenza, Seasonal, Injecte, Preservative Fre 08/08/2008   PPD Test 10/07/2022   Td 07/17/2015     Objective: Vital Signs: BP 137/84 (BP Location: Left Arm, Patient Position: Sitting, Cuff Size: Normal)   Pulse (!) 106   Resp 16   Ht 5' 4.5" (1.638 m)   Wt (!) 396 lb (179.6 kg)   LMP  (LMP Unknown) Comment: Depo  BMI 66.92 kg/m    Physical Exam Constitutional:      Appearance: She is obese.  Eyes:     Conjunctiva/sclera: Conjunctivae normal.  Cardiovascular:     Rate and Rhythm: Normal rate and regular rhythm.  Pulmonary:     Effort: Pulmonary effort is normal.     Breath sounds: Normal breath sounds.  Musculoskeletal:     Comments: Trace pedal edema  Lymphadenopathy:     Cervical: No cervical adenopathy.  Skin:    General: Skin is warm and dry.  Neurological:     Mental Status: She is alert.  Psychiatric:        Mood and Affect: Mood normal.      Musculoskeletal Exam:   Elbows full ROM no tenderness or swelling Wrists full ROM no tenderness or swelling Fingers full ROM no tenderness or swelling Midline low back tenderness without radiation Mild right hip tenderness to pressure no radiation Right knee anterior tenderness to pressure, no palpable effusion Ankles full ROM no tenderness or swelling  Investigation: No additional findings.  Imaging: No results found.  Recent Labs: Lab Results  Component Value Date   WBC 10.4 08/05/2023   HGB 12.5 08/05/2023   PLT 312 08/05/2023   NA 136 08/05/2023   K 4.0 08/05/2023   CL 103 08/05/2023   CO2 26 08/05/2023   GLUCOSE 164 (H) 08/05/2023   BUN 9 08/05/2023   CREATININE 0.76 08/05/2023   BILITOT 0.4 05/09/2023   ALKPHOS 92 05/09/2023   AST 11 (L) 05/09/2023   ALT  16 05/09/2023   PROT 7.1 05/09/2023   ALBUMIN 3.3 (L) 05/09/2023   CALCIUM 9.0 08/05/2023    Speciality Comments: No specialty comments available.  Procedures:  No procedures performed Allergies: Clindamycin/lincomycin, Gabapentin, Other, Chlorpheniramine-phenylephrine, Pregabalin, and Cephalexin   Assessment / Plan:     Visit Diagnoses: Seronegative rheumatoid arthritis (HCC) - Plan: hydroxychloroquine (PLAQUENIL) 200 MG tablet History of systemic lupus erythematosus (HCC)  Lupus diagnosis is not clear to me her serology is negative so cannot confirm that at this time.  Might have had previous positive antibodies improved over time or in response to previous immunosuppression.  Does have markedly high inflammatory markers although cannot rule out a breast abscess as a cause for this.  For now would recommend trial of adding a steroid sparing DMARD medication possible seronegative inflammatory arthritis.  Try addition of hydroxychloroquine 400 mg daily.  Plan to follow-up in about 2 months with repeat inflammatory markers.  Could reassess if there is a more definite flareup with peripheral joint swelling or inflammatory skin  rashes.  Orders: No orders of the defined types were placed in this encounter.  Meds ordered this encounter  Medications   hydroxychloroquine (PLAQUENIL) 200 MG tablet    Sig: Take 2 tablets (400 mg total) by mouth daily.    Dispense:  60 tablet    Refill:  2     Follow-Up Instructions: Return in about 8 weeks (around 10/25/2023) for RA HCQ start f/u 2mos.   Fuller Plan, MD  Note - This record has been created using [employer].  Chart creation errors have been sought, but may not always  have been located. Such creation errors do not reflect on  the standard of medical care.

## 2023-08-30 ENCOUNTER — Ambulatory Visit: Payer: BC Managed Care – PPO | Attending: Internal Medicine | Admitting: Internal Medicine

## 2023-08-30 ENCOUNTER — Encounter: Payer: Self-pay | Admitting: Internal Medicine

## 2023-08-30 VITALS — BP 137/84 | HR 106 | Resp 16 | Ht 64.5 in | Wt 396.0 lb

## 2023-08-30 DIAGNOSIS — M329 Systemic lupus erythematosus, unspecified: Secondary | ICD-10-CM | POA: Diagnosis not present

## 2023-08-30 DIAGNOSIS — M0600 Rheumatoid arthritis without rheumatoid factor, unspecified site: Secondary | ICD-10-CM

## 2023-08-30 MED ORDER — HYDROXYCHLOROQUINE SULFATE 200 MG PO TABS
400.0000 mg | ORAL_TABLET | Freq: Every day | ORAL | 2 refills | Status: DC
Start: 2023-08-30 — End: 2023-11-08

## 2023-08-30 NOTE — Patient Instructions (Signed)
 Hydroxychloroquine Tablets What is this medication? HYDROXYCHLOROQUINE (hye drox ee KLOR oh kwin) treats autoimmune conditions, such as rheumatoid arthritis and lupus. It works by slowing down an overactive immune system. It may also be used to prevent and treat malaria. It works by killing the parasite that causes malaria. It belongs to a group of medications called DMARDs. This medicine may be used for other purposes; ask your health care provider or pharmacist if you have questions. COMMON BRAND NAME(S): Plaquenil, Quineprox, SOVUNA What should I tell my care team before I take this medication? They need to know if you have any of these conditions: Diabetes Eye disease, vision problems Frequently drink alcohol G6PD deficiency Heart disease Irregular heartbeat or rhythm Kidney disease Liver disease Porphyria Psoriasis An unusual or allergic reaction to hydroxychloroquine, other medications, foods, dyes, or preservatives Pregnant or trying to get pregnant Breastfeeding How should I use this medication? Take this medication by mouth with water. Take it as directed on the prescription label. Do not cut, crush, or chew this medication. Swallow the tablets whole. Take it with food. Do not take it more than directed. Take all of this medication unless your care team tells you to stop it early. Keep taking it even if you think you are better. Take products with antacids in them at a different time of day than this medication. Take this medication 4 hours before or 4 hours after antacids. Talk to your care team if you have questions. Talk to your care team about the use of this medication in children. While this medication may be prescribed for selected conditions, precautions do apply. Overdosage: If you think you have taken too much of this medicine contact a poison control center or emergency room at once. NOTE: This medicine is only for you. Do not share this medicine with others. What if I  miss a dose? If you miss a dose, take it as soon as you can. If it is almost time for your next dose, take only that dose. Do not take double or extra doses. What may interact with this medication? Do not take this medication with any of the following: Cisapride Dronedarone Pimozide Thioridazine This medication may also interact with the following: Ampicillin Antacids Cimetidine Cyclosporine Digoxin Kaolin Medications for diabetes, such as insulin, glipizide, glyburide Medications for seizures, such as carbamazepine, phenobarbital, phenytoin Mefloquine Methotrexate Other medications that cause heart rhythm changes Praziquantel This list may not describe all possible interactions. Give your health care provider a list of all the medicines, herbs, non-prescription drugs, or dietary supplements you use. Also tell them if you smoke, drink alcohol, or use illegal drugs. Some items may interact with your medicine. What should I watch for while using this medication? Visit your care team for regular checks on your progress. Tell your care team if your symptoms do not start to get better or if they get worse. You may need blood work done while you are taking this medication. If you take other medications that can affect heart rhythm, you may need more testing. Talk to your care team if you have questions. Your vision may be tested before and during use of this medication. Tell your care team right away if you have any change in your eyesight. This medication may cause serious skin reactions. They can happen weeks to months after starting the medication. Contact your care team right away if you notice fevers or flu-like symptoms with a rash. The rash may be red or purple and then  turn into blisters or peeling of the skin. Or, you might notice a red rash with swelling of the face, lips or lymph nodes in your neck or under your arms. If you or your family notice any changes in your behavior, such as  new or worsening depression, thoughts of harming yourself, anxiety, or other unusual or disturbing thoughts, or memory loss, call your care team right away. What side effects may I notice from receiving this medication? Side effects that you should report to your care team as soon as possible: Allergic reactions--skin rash, itching, hives, swelling of the face, lips, tongue, or throat Aplastic anemia--unusual weakness or fatigue, dizziness, headache, trouble breathing, increased bleeding or bruising Change in vision Heart rhythm changes--fast or irregular heartbeat, dizziness, feeling faint or lightheaded, chest pain, trouble breathing Infection--fever, chills, cough, or sore throat Low blood sugar (hypoglycemia)--tremors or shaking, anxiety, sweating, cold or clammy skin, confusion, dizziness, rapid heartbeat Muscle injury--unusual weakness or fatigue, muscle pain, dark yellow or brown urine, decrease in amount of urine Pain, tingling, or numbness in the hands or feet Rash, fever, and swollen lymph nodes Redness, blistering, peeling, or loosening of the skin, including inside the mouth Thoughts of suicide or self-harm, worsening mood, or feelings of depression Unusual bruising or bleeding Side effects that usually do not require medical attention (report to your care team if they continue or are bothersome): Diarrhea Headache Nausea Stomach pain Vomiting This list may not describe all possible side effects. Call your doctor for medical advice about side effects. You may report side effects to FDA at 1-800-FDA-1088. Where should I keep my medication? Keep out of the reach of children and pets. Store at room temperature up to 30 degrees C (86 degrees F). Protect from light. Get rid of any unused medication after the expiration date. To get rid of medications that are no longer needed or have expired: Take the medication to a medication take-back program. Check with your pharmacy or law  enforcement to find a location. If you cannot return the medication, check the label or package insert to see if the medication should be thrown out in the garbage or flushed down the toilet. If you are not sure, ask your care team. If it is safe to put it in the trash, empty the medication out of the container. Mix the medication with cat litter, dirt, coffee grounds, or other unwanted substance. Seal the mixture in a bag or container. Put it in the trash. NOTE: This sheet is a summary. It may not cover all possible information. If you have questions about this medicine, talk to your doctor, pharmacist, or health care provider.  2024 Elsevier/Gold Standard (2022-05-04 00:00:00)

## 2023-09-01 ENCOUNTER — Telehealth: Payer: Self-pay | Admitting: Internal Medicine

## 2023-09-01 DIAGNOSIS — M0600 Rheumatoid arthritis without rheumatoid factor, unspecified site: Secondary | ICD-10-CM

## 2023-09-01 MED ORDER — PREDNISONE 10 MG (21) PO TBPK
ORAL_TABLET | ORAL | 0 refills | Status: DC
Start: 2023-09-01 — End: 2023-09-20

## 2023-09-01 NOTE — Telephone Encounter (Signed)
Patient advised Dr. Dimple Casey will send prednisone taper for 6 days similar to the previously prescribed for her arthritis.

## 2023-09-01 NOTE — Telephone Encounter (Signed)
Patient called stating she is experiencing a lot of body pain.  Patient states Dr. Dimple Casey prescribed a new medication and requested something for the pain while she waits for the medication to work.

## 2023-09-01 NOTE — Telephone Encounter (Signed)
Will send prednisone taper for 6 days similar to the previously prescribed for her arthritis.

## 2023-09-06 ENCOUNTER — Encounter: Payer: Self-pay | Admitting: Sports Medicine

## 2023-09-09 ENCOUNTER — Encounter: Payer: BC Managed Care – PPO | Admitting: Sports Medicine

## 2023-09-09 ENCOUNTER — Ambulatory Visit
Admission: EM | Admit: 2023-09-09 | Discharge: 2023-09-09 | Disposition: A | Discharge status: Home / Self Care | Payer: BC Managed Care – PPO | Attending: Internal Medicine | Admitting: Internal Medicine

## 2023-09-09 ENCOUNTER — Ambulatory Visit: Payer: BC Managed Care – PPO | Admitting: Sports Medicine

## 2023-09-09 VITALS — HR 112 | Ht 64.0 in | Wt 397.0 lb

## 2023-09-09 DIAGNOSIS — N760 Acute vaginitis: Secondary | ICD-10-CM | POA: Diagnosis not present

## 2023-09-09 DIAGNOSIS — B9689 Other specified bacterial agents as the cause of diseases classified elsewhere: Secondary | ICD-10-CM | POA: Diagnosis not present

## 2023-09-09 DIAGNOSIS — F411 Generalized anxiety disorder: Secondary | ICD-10-CM | POA: Diagnosis not present

## 2023-09-09 DIAGNOSIS — S060X0D Concussion without loss of consciousness, subsequent encounter: Secondary | ICD-10-CM | POA: Diagnosis not present

## 2023-09-09 DIAGNOSIS — G44319 Acute post-traumatic headache, not intractable: Secondary | ICD-10-CM

## 2023-09-09 DIAGNOSIS — G4700 Insomnia, unspecified: Secondary | ICD-10-CM

## 2023-09-09 LAB — POCT URINE PREGNANCY: Preg Test, Ur: NEGATIVE

## 2023-09-09 MED ORDER — FLUCONAZOLE 150 MG PO TABS: 150.0000 mg | ORAL_TABLET | ORAL | 0 refills | Status: DC

## 2023-09-09 MED ORDER — TINIDAZOLE 500 MG PO TABS: 1.0000 g | ORAL_TABLET | Freq: Every day | ORAL | 0 refills | Status: DC

## 2023-09-09 NOTE — Progress Notes (Signed)
Jade Lopez D.[facility] [address] [place] [address] Phone: [phone]  Assessment and Plan:     1. Concussion without loss of consciousness, subsequent encounter 2. Acute post-traumatic headache, not intractable 3. Insomnia, unspecified type 4. Anxiety state   - Subacute, worsening, complicated, subsequent visit - Unclear etiology of ongoing symptoms.  Patient has been followed for possible concussion, however patient's presentation would be highly unusual for concussion diagnosis based on patient's relatively mild MOI with patient bumping her head twice within the 3-day period, no loss of consciousness, no symptoms between the first and second incident, relatively unremarkable physical exam, and patient comorbidities including fibromyalgia and chronic headaches.  Patient has also not responded and overall symptoms have worsened despite typical concussion treatments including relative rest, avoidance of triggers, trazodone 300 mg to assist with sleep. -Patient is also filing a workers Research scientist (medical).  We discussed that we do not work with Jade Lopez. at this office. - In all cases involving work and potential monetary involvement, there is the potential for malingering - Patient states she is still not sleeping more than 2 to 3 hours at night despite trazodone 300 mg nightly and melatonin 5 mg nightly.  We discussed that this is the maximum dosage of medication to be used for sleep aid.  May continue trazodone 300 mg nightly and melatonin 5 mg nightly - Reassuring the patient had negative CT head on 07/15/2023, however due to unusual presentation, worsening symptoms, I recommend further evaluation with brain MRI with contrast - Provided work note for patient to be out of work for 2 weeks or until reevaluated.  At last office visit on 08/12/2023 we provided a work note for patient to gradually return to work, however patient states that after 1 day at work  her work told her to go home.  Date of injury was 8/24 and 07/05/2023. Symptom severity scores of 22 and 105 today. Original symptom severity scores were 22 and 86. The patient was counseled on the nature of the injury, typical course and potential options for further evaluation and treatment. Discussed the importance of compliance with recommendations. Patient stated understanding of this plan and willingness to comply.  Recommendations:  -  Relative mental and physical rest for 48 hours after concussive event - Recommend light aerobic activity while keeping symptoms less than 3/10 - Stop mental or physical activities that cause symptoms to worsen greater than 3/10, and wait 24 hours before attempting them again - Eliminate screen time as much as possible for first 48 hours after concussive event, then continue limited screen time (recommend less than 2 hours per day)   - Encouraged to RTC 5 days after MRI to review results and discuss treatment plan.  If no findings, could consider psychosomatic cause of symptoms and could refer to psychiatry versus behavioral health  Pertinent previous records reviewed include none   Time of visit 32 minutes, which included chart review, physical exam, treatment plan, symptom severity score, VOMS, and tandem gait testing being performed, interpreted, and discussed with patient at today's visit.   Subjective:   I, Jade Lopez, am serving as a Neurosurgeon for Jade Lopez   Chief Complaint: concussion symptoms    HPI:    07/29/23 Patient is a 27 year old female complaining of concussion symptoms. Patient states third encounter after hitting her had on a doorframe on 8/24, then hitting her head on a desk on 8/26. Patient reports continued symptoms since last  visit with only mild improvement. She has a chronic headache with intermittent periods of more severe pain. Tylenol and ibuprofen are only moderately helpful. She states she has memory loss such  as forgetting which day it is, intermittent blurry vision, some nausea with no vomiting, occasional lightheadedness.    08/12/2023 Patient states she has been really stressed with life . Has been having to adjust to medications . Trazodone hasn't really helped out much with sleep only about 2 days of 7-8 hours of sleep interrupted.    09/09/2023 Patient states she feels like crap. Hasn't been back to work. Is stressed out . Headaches and isnt able to sleep but thinks that could be due to anxiety.    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 No Diagnosed/treated for headache disorder, migraines, or seizures?yes  Diagnosed with learning disability Jade Lopez? Yes dyslexia  Diagnosed with ADD/ADHD? no Diagnose with Depression, anxiety, or other Psychiatric Disorder? Yes  Current medications:  Current Outpatient Medications  Medication Sig Dispense Refill   Accu-Chek Softclix Lancets lancets daily.     albuterol (VENTOLIN HFA) 108 (90 Base) MCG/ACT inhaler Inhale 1-2 puffs into the lungs every 6 (six) hours as needed for wheezing or shortness of breath. 1 each 1   Blood Glucose Monitoring Suppl (MM EASY TOUCH GLUCOSE METER) w/Device KIT Please check your blood sugar daily, in the morning. 1 kit 0   Blood Glucose Monitoring Suppl DEVI 1 each by Does not apply route 3 (three) times daily. May dispense any manufacturer covered by patient's insurance. 1 each 0   budesonide-formoterol (SYMBICORT) 80-4.5 MCG/ACT inhaler Inhale 2 puffs into the lungs 2 (two) times daily as needed. 1 each 12   busPIRone (BUSPAR) 10 MG tablet Take 3 tablets (30 mg total) by mouth daily. 30 tablet 5   busPIRone  (BUSPAR) 15 MG tablet Take 15 mg by mouth 2 (two) times daily.     Glucose Blood (BLOOD GLUCOSE TEST STRIPS) STRP 1 each by Does not apply route 3 (three) times daily. Use as directed to check blood sugar. May dispense any manufacturer covered by patient's insurance and fits patient's device. 100 strip 0   hydroxychloroquine (PLAQUENIL) 200 MG tablet Take 2 tablets (400 mg total) by mouth daily. 60 tablet 2   Lancet Device MISC 1 each by Does not apply route 3 (three) times daily. May dispense any manufacturer covered by patient's insurance. 1 each 0   Lancets MISC 1 each by Does not apply route 3 (three) times daily. Use as directed to check blood sugar. May dispense any manufacturer covered by patient's insurance and fits patient's device. 100 each 0   medroxyPROGESTERone (DEPO-PROVERA) 150 MG/ML injection Inject 150 mg into the muscle every 3 (three) months.     naratriptan (AMERGE) 2.5 MG tablet 2.5 mg.     norethindrone (AYGESTIN) 5 MG tablet Take 10 mg by mouth every evening.     ondansetron (ZOFRAN-ODT) 8 MG disintegrating tablet Take 1 tablet (8 mg total) by mouth every 8 (eight) hours as needed for nausea or vomiting. 20 tablet 0   predniSONE (STERAPRED UNI-PAK 21 TAB) 10 MG (21)  TBPK tablet Take by mouth 6, 5, 4, 3, 2, 1 once daily 21 tablet 0   pregabalin (LYRICA) 50 MG capsule Take 1 capsule (50 mg total) by mouth 3 (three) times daily. 90 capsule 3   tirzepatide (MOUNJARO) 5 MG/0.5ML Pen Inject 5 mg into the skin once a week.     traZODone (DESYREL) 50 MG tablet Take 1 tablet (50 mg total) by mouth at bedtime. 30 tablet 0   No current facility-administered medications for this visit.      Objective:     Vitals:   09/09/23 1300  Pulse: (!) 112  SpO2: 99%  Weight: (!) 397 lb (180.1 kg)  Height: 5\' 4"  (1.626 m)      Body mass index is 68.14 kg/m.    Physical Exam:     General: Well-appearing, cooperative, sitting comfortably in no acute distress.  Psychiatric: Mood and  affect are appropriate.   Neuro:sensation intact and strength 5/5 with no deficits, no atrophy, normal muscle tone   Today's Symptom Severity Score:  Scores: 0-6  Headache:5 "Pressure in head":3  Neck Pain:5 Nausea or vomiting:3 Dizziness:5 Blurred vision:5 Balance problems:3 Sensitivity to light:4 Sensitivity to noise:4 Feeling slowed down:5 Feeling like "in a fog":5 "Don't feel right":5 Difficulty concentrating:5 Difficulty remembering:4  Fatigue or low energy:6 Confusion:4  Drowsiness:6  More emotional:6 Irritability:5 Sadness:6  Nervous or Anxious:6 Trouble falling or staying asleep:6  Total number of symptoms: 22/22  Symptom Severity index: 105/132  Worse with physical activity? Yes  Worse with mental activity? Yes  Percent improved since injury: 40%    Full pain-free cervical PROM: yes       Electronically signed by:  [facility]ade [facility] 1:27 PM 09/09/23

## 2023-09-09 NOTE — ED Provider Notes (Signed)
[name]  Note:  This document was prepared using Conservation officer, historic buildings and may include unintentional dictation errors.  MRN: [id number] DOB: 04/04/1996  Subjective:   Jade Lopez is a 27 y.o. female presenting for 4-day history of recurrent thick vaginal discharge.  Would like a pregnancy test.  She was just treated for bacterial vaginosis and yeast infection 2 weeks ago.  Took metronidazole and fluconazole.  Has no concern for STIs but would like to be rechecked.  No urinary symptoms.  No current facility-administered medications for this encounter.  Current Outpatient Medications:    Accu-Chek Softclix Lancets lancets, daily., Disp: , Rfl:    albuterol (VENTOLIN HFA) 108 (90 Base) MCG/ACT inhaler, Inhale 1-2 puffs into the lungs every 6 (six) hours as needed for wheezing or shortness of breath., Disp: 1 each, Rfl: 1   Blood Glucose Monitoring Suppl (MM EASY TOUCH GLUCOSE METER) w/Device KIT, Please check your blood sugar daily, in the morning., Disp: 1 kit, Rfl: 0   Blood Glucose Monitoring Suppl DEVI, 1 each by Does not apply route 3 (three) times daily. May dispense any manufacturer covered by patient's insurance., Disp: 1 each, Rfl: 0   budesonide-formoterol (SYMBICORT) 80-4.5 MCG/ACT inhaler, Inhale 2 puffs into the lungs 2 (two) times daily as needed., Disp: 1 each, Rfl: 12   busPIRone (BUSPAR) 10 MG tablet, Take 3 tablets (30 mg total) by mouth daily., Disp: 30 tablet, Rfl: 5   busPIRone (BUSPAR) 15 MG tablet, Take 15 mg by mouth 2 (two) times daily., Disp: , Rfl:    Glucose Blood (BLOOD GLUCOSE TEST STRIPS) STRP, 1 each by Does not apply route 3 (three) times daily. Use as directed to check blood sugar. May dispense any manufacturer covered by patient's insurance and fits patient's device., Disp: 100 strip, Rfl: 0   hydroxychloroquine (PLAQUENIL) 200 MG tablet, Take 2 tablets (400 mg total) by mouth daily., Disp: 60 tablet, Rfl: 2   Lancet  Device MISC, 1 each by Does not apply route 3 (three) times daily. May dispense any manufacturer covered by patient's insurance., Disp: 1 each, Rfl: 0   Lancets MISC, 1 each by Does not apply route 3 (three) times daily. Use as directed to check blood sugar. May dispense any manufacturer covered by patient's insurance and fits patient's device., Disp: 100 each, Rfl: 0   medroxyPROGESTERone (DEPO-PROVERA) 150 MG/ML injection, Inject 150 mg into the muscle every 3 (three) months., Disp: , Rfl:    naratriptan (AMERGE) 2.5 MG tablet, 2.5 mg., Disp: , Rfl:    norethindrone (AYGESTIN) 5 MG tablet, Take 10 mg by mouth every evening., Disp: , Rfl:    ondansetron (ZOFRAN-ODT) 8 MG disintegrating tablet, Take 1 tablet (8 mg total) by mouth every 8 (eight) hours as needed for nausea or vomiting., Disp: 20 tablet, Rfl: 0   predniSONE (STERAPRED UNI-PAK 21 TAB) 10 MG (21) TBPK tablet, Take by mouth 6, 5, 4, 3, 2, 1 once daily, Disp: 21 tablet, Rfl: 0   pregabalin (LYRICA) 50 MG capsule, Take 1 capsule (50 mg total) by mouth 3 (three) times daily., Disp: 90 capsule, Rfl: 3   tirzepatide (MOUNJARO) 5 MG/0.5ML Pen, Inject 5 mg into the skin once a week., Disp: , Rfl:    traZODone (DESYREL) 50 MG tablet, Take 1 tablet (50 mg total) by mouth at bedtime., Disp: 30 tablet, Rfl: 0   Allergies  Allergen Reactions   Clindamycin/Lincomycin Other (See Comments), Hives, Palpitations, Rash and Swelling  Other Reaction(s): Racing heart beat   Gabapentin Hives, Nausea And Vomiting and Palpitations    Other Reaction(s): Unknown   Other Anaphylaxis    Allergic to Rynatan cough syrup.   Chlorpheniramine-Phenylephrine Nausea And Vomiting    Patient states she is not allergic to this.    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   T2DM (type 2 diabetes mellitus) (HCC)    TOA (tubo-ovarian abscess)      Past Surgical History:  Procedure Laterality Date   CHOLECYSTECTOMY     DILATATION AND CURETTAGE/HYSTEROSCOPY WITH MINERVA     3 total   TONSILLECTOMY     TONSILLECTOMY AND ADENOIDECTOMY      Family History  Problem Relation Age of Onset   Diabetes Mother    Hypertension Mother    Diabetes Father    Hypertension Father    Sleep apnea Father     Social History   Tobacco Use   Smoking status: Never    Passive exposure: Never   Smokeless tobacco: Never  Vaping Use   Vaping status: Never Used  Substance Use Topics   Alcohol use: Yes    Comment: occ   Drug use: No    ROS   Objective:   Vitals: BP 112/89 (BP Location: Right Arm)   Pulse 95   Temp 99.1 F (37.3 C) (Oral)   Resp 18   LMP  (LMP Unknown)   SpO2 98%   Physical Exam Constitutional:      General: She is not in acute distress.    Appearance: Normal appearance. She is well-developed. She is not ill-appearing, toxic-appearing or diaphoretic.  HENT:     Head: Normocephalic and atraumatic.     Nose: Nose normal.     Mouth/Throat:     Mouth: Mucous membranes are moist.  Eyes:     General: No scleral icterus.       Right eye: No discharge.        Left eye: No discharge.     Extraocular Movements: Extraocular movements intact.  Cardiovascular:     Rate and Rhythm: Normal rate.  Pulmonary:     Effort: Pulmonary effort is normal.  Skin:    General: Skin is warm and dry.  Neurological:     General: No focal deficit present.     Mental Status: She is alert and oriented to person, place, and time.  Psychiatric:        Mood and Affect: Mood normal.        Behavior: Behavior normal.     Results for orders placed or performed during the hospital encounter of 09/09/23 (from the past 24  hour(s))  POCT urine pregnancy     Status: None   Collection Time: 09/09/23  6:18 PM  Result Value Ref Range   Preg Test, Ur Negative Negative    Assessment and Plan :   PDMP not reviewed this encounter.  1. Bacterial vaginosis   2. Acute vaginitis    Recommended empiric treatment with tinidazole for recurrent BV infection, extended course of  fluconazole at 5 doses.  Vaginal cytology pending.  Counseled patient on potential for adverse effects with medications prescribed/recommended today, ER and return-to-clinic precautions discussed, patient verbalized understanding.    Wallis Bamberg, [place] 09/09/23 0347

## 2023-09-09 NOTE — Patient Instructions (Addendum)
Brain MRI  Work note provided Follow up 5 days after MRI to discuss results

## 2023-09-09 NOTE — ED Triage Notes (Signed)
Pt requested STD's and pregnancy test Reports dark cream vaginal discharge x 3-4 days. States she was taking antibiotic couple days ago "my Ph was off".

## 2023-09-10 ENCOUNTER — Telehealth: Payer: Self-pay

## 2023-09-10 LAB — CERVICOVAGINAL ANCILLARY ONLY
Bacterial Vaginitis (gardnerella): NEGATIVE
Candida Glabrata: NEGATIVE
Candida Vaginitis: POSITIVE — AB
Chlamydia: NEGATIVE
Comment: NEGATIVE
Comment: NEGATIVE
Comment: NEGATIVE
Comment: NEGATIVE
Comment: NEGATIVE
Comment: NORMAL
Neisseria Gonorrhea: NEGATIVE
Trichomonas: NEGATIVE

## 2023-09-10 NOTE — Telephone Encounter (Signed)
Patient called stating that her work does not want to accept the letter that doctor Jean Rosenthal wrote to have her out of work. She stated they needed the letter to specify 1) Current nature/severity of condition 2)estimated duration time needed for current condition 3)recommended accomodation   Patient would like a call back to discuss and would also like the letter sent through [facility]

## 2023-09-10 NOTE — Telephone Encounter (Signed)
Spoke to patient. She said that her job does not offer FMLA. She asked if Dr Jean Rosenthal would be able to write a letter stating that is all the information he can give due to HIPAA laws, etc? Her job is giving her a hard time with all of this.

## 2023-09-13 ENCOUNTER — Encounter: Payer: Self-pay | Admitting: Sports Medicine

## 2023-09-13 NOTE — Telephone Encounter (Signed)
New note written and given to front desk (carson)

## 2023-09-15 ENCOUNTER — Encounter: Payer: Self-pay | Admitting: Sports Medicine

## 2023-09-15 ENCOUNTER — Telehealth: Payer: Self-pay

## 2023-09-15 DIAGNOSIS — N611 Abscess of the breast and nipple: Secondary | ICD-10-CM | POA: Diagnosis not present

## 2023-09-15 NOTE — Telephone Encounter (Signed)
Patient came in and is getting really upset because her work is making it really difficult for her in regard of being of work because of her condition. Patient's job informed her that if she does not get a letter describing what she can and cannot do at her job by 8:30 tomorrow morning she will be let go. Patient dropped off her job description so that the letter can be precise on what she can and cannot do. Patient is okay with getting the letter through [facility]

## 2023-09-15 NOTE — Telephone Encounter (Signed)
New note sent via mychart

## 2023-09-16 ENCOUNTER — Encounter: Payer: Self-pay | Admitting: Sports Medicine

## 2023-09-20 ENCOUNTER — Encounter: Payer: Self-pay | Admitting: Emergency Medicine

## 2023-09-20 ENCOUNTER — Ambulatory Visit
Admission: EM | Admit: 2023-09-20 | Discharge: 2023-09-20 | Disposition: A | Discharge status: Home / Self Care | Payer: BC Managed Care – PPO | Attending: Internal Medicine | Admitting: Internal Medicine

## 2023-09-20 ENCOUNTER — Other Ambulatory Visit: Payer: Self-pay

## 2023-09-20 DIAGNOSIS — M546 Pain in thoracic spine: Secondary | ICD-10-CM

## 2023-09-20 MED ORDER — PREDNISONE 10 MG (21) PO TBPK
ORAL_TABLET | Freq: Every day | ORAL | 0 refills | Status: DC
Start: 2023-09-20 — End: 2023-11-11

## 2023-09-20 MED ORDER — KETOROLAC TROMETHAMINE 30 MG/ML IJ SOLN
30.0000 mg | Freq: Once | INTRAMUSCULAR | Status: AC
  Administered 2023-09-20: 30 mg via INTRAMUSCULAR

## 2023-09-20 MED ORDER — DEXAMETHASONE SODIUM PHOSPHATE 10 MG/ML IJ SOLN
10.0000 mg | Freq: Once | INTRAMUSCULAR | Status: AC
  Administered 2023-09-20: 10 mg via INTRAMUSCULAR

## 2023-09-20 MED ORDER — CYCLOBENZAPRINE HCL 10 MG PO TABS: 10.0000 mg | ORAL_TABLET | Freq: Two times a day (BID) | ORAL | 1 refills | Status: DC | PRN

## 2023-09-20 NOTE — ED Provider Notes (Signed)
[facility]    CSN: [id number] Arrival date & time: 09/20/23  1318      History   Chief Complaint Chief Complaint  Patient presents with   Back Pain    HPI Jade Lopez is a 27 y.o. female.   27 year old female who presents to urgent care with complaints of thoracic back pain.  This started about 2 days ago.  She has had this in past.  She has a history of T11 and T12 fractures that did not require surgery.  This is caused her to have recurrent back pain.  She denies any specific injury that has caused the current back pain.  She did take a cyclobenzaprine last night which helped some.  She has not been using any other treatment.  She reports she has been unable to do physical therapy to help her back.   Back Pain Associated symptoms: no abdominal pain, no chest pain, no dysuria and no fever     Past Medical History:  Diagnosis Date   Asthma    Autoimmune disease (HCC)    Blood clots in brain 2020   Class 3 obesity    Endometriosis    Endometritis    Fibromyalgia    Near syncope 05/09/2023   Sleep apnea    Sleep related headaches 02/24/2023   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   T2DM (type 2 diabetes mellitus) (HCC)    TOA (tubo-ovarian abscess)     Patient Active Problem List   Diagnosis Date Noted   Abnormal laboratory test result 07/29/2023   Traumatic brain injury (HCC) 07/14/2023   History of nausea and vomiting 05/19/2023   Fibromyalgia 04/11/2023   Abscess of right breast 04/03/2023   Abdominal wall pain 03/01/2023   Sleep related headaches 02/24/2023   OSA on CPAP 02/24/2023   Sleeps in sitting position due to orthopnea 02/24/2023   Menorrhagia 01/28/2023   Healthcare maintenance 01/28/2023   Chest pain 01/28/2023   Back pain 01/28/2023   Type 2 diabetes mellitus (HCC) 11/28/2022   History of systemic lupus erythematosus (HCC) 11/28/2022   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/28/2022   Endometriosis 11/28/2022   Anxiety 11/28/2022   Vitamin D deficiency  09/13/2012   Asthma 08/22/2012    Past Surgical History:  Procedure Laterality Date   CHOLECYSTECTOMY     DILATATION AND CURETTAGE/HYSTEROSCOPY WITH MINERVA     3 total   TONSILLECTOMY     TONSILLECTOMY AND ADENOIDECTOMY      OB History     Gravida  0   Para  0   Term  0   Preterm  0   AB  0   Living  0      SAB  0   IAB  0   Ectopic  0   Multiple  0   Live Births  0            Home Medications    Prior to Admission medications   Medication Sig Start Date End Date Taking? Authorizing Provider  cyclobenzaprine (FLEXERIL) 10 MG tablet Take 1 tablet (10 mg total) by mouth 2 (two) times daily as needed for muscle spasms. 09/20/23  Yes Maynard David A, PA-C  predniSONE (STERAPRED UNI-PAK 21 TAB) 10 MG (21) TBPK tablet Take by mouth daily. Take 6 tabs by mouth daily  for 2 days, then 5 tabs for 2 days, then 4 tabs for 2 days, then 3 tabs for 2 days, 2 tabs for 2 days, then 1 tab by mouth  daily for 2 days 09/20/23  Yes Landis Martins, PA-C  Accu-Chek Softclix Lancets lancets daily. 12/15/22   [provider]  albuterol (VENTOLIN HFA) 108 (90 Base) MCG/ACT inhaler Inhale 1-2 puffs into the lungs every 6 (six) hours as needed for wheezing or shortness of breath. 05/10/23   Marinda Elk, MD  Blood Glucose Monitoring Suppl (MM EASY TOUCH GLUCOSE METER) w/Device KIT Please check your blood sugar daily, in the morning. 12/08/22   Marolyn Haller, MD  Blood Glucose Monitoring Suppl DEVI 1 each by Does not apply route 3 (three) times daily. May dispense any manufacturer covered by patient's insurance. 05/10/23   Marinda Elk, MD  budesonide-formoterol (SYMBICORT) 80-4.5 MCG/ACT inhaler Inhale 2 puffs into the lungs 2 (two) times daily as needed. 05/19/23   Kathleen Lime, MD  busPIRone (BUSPAR) 10 MG tablet Take 3 tablets (30 mg total) by mouth daily. Patient not taking: Reported on 09/20/2023 11/25/22   Marolyn Haller, MD  busPIRone (BUSPAR) 15  MG tablet Take 15 mg by mouth 2 (two) times daily. Patient not taking: Reported on 09/20/2023 06/22/23   [provider]  fluconazole (DIFLUCAN) 150 MG tablet Take 1 tablet (150 mg total) by mouth every 3 (three) days. 09/09/23   Wallis Bamberg, PA-C  Glucose Blood (BLOOD GLUCOSE TEST STRIPS) STRP 1 each by Does not apply route 3 (three) times daily. Use as directed to check blood sugar. May dispense any manufacturer covered by patient's insurance and fits patient's device. 05/10/23   Marinda Elk, MD  hydroxychloroquine (PLAQUENIL) 200 MG tablet Take 2 tablets (400 mg total) by mouth daily. 08/30/23   Fuller Plan, MD  Lancet Device MISC 1 each by Does not apply route 3 (three) times daily. May dispense any manufacturer covered by patient's insurance. 05/10/23   Marinda Elk, MD  Lancets MISC 1 each by Does not apply route 3 (three) times daily. Use as directed to check blood sugar. May dispense any manufacturer covered by patient's insurance and fits patient's device. 05/10/23   Marinda Elk, MD  medroxyPROGESTERone (DEPO-PROVERA) 150 MG/ML injection Inject 150 mg into the muscle every 3 (three) months.    [provider]  naratriptan (AMERGE) 2.5 MG tablet 2.5 mg. 07/15/22   [provider]  norethindrone (AYGESTIN) 5 MG tablet Take 10 mg by mouth every evening. 12/11/22   [provider]  ondansetron (ZOFRAN-ODT) 8 MG disintegrating tablet Take 1 tablet (8 mg total) by mouth every 8 (eight) hours as needed for nausea or vomiting. 05/17/23   [name], NP  pregabalin (LYRICA) 50 MG capsule Take 1 capsule (50 mg total) by mouth 3 (three) times daily. Patient not taking: Reported on 09/20/2023 05/19/23   Kathleen Lime, MD  tinidazole [facility]) 500 MG tablet Take 2 tablets (1,000 mg total) by mouth daily with breakfast. 09/09/23   Wallis Bamberg, PA-C  tirzepatide [facility]) 5 MG/0.5ML Pen Inject 5 mg into the skin once a week. 08/05/23   [provider]  traZODone (DESYREL) 50 MG tablet Take 1 tablet (50 mg total) by mouth at bedtime. Patient not taking: Reported on 09/20/2023 08/26/23   Richardean Sale, DO    Family History Family History  Problem Relation Age of Onset   Diabetes Mother    Hypertension Mother    Diabetes Father    Hypertension Father    Sleep apnea Father     Social History Social History   Tobacco Use   Smoking status:  Never    Passive exposure: Never   Smokeless tobacco: Never  Vaping Use   Vaping status: Never Used  Substance Use Topics   Alcohol use: Yes    Comment: occ   Drug use: No     Allergies   Clindamycin/lincomycin, Gabapentin, Other, Chlorpheniramine-phenylephrine, Pregabalin, and Cephalexin   Review of Systems Review of Systems  Constitutional:  Negative for chills and fever.  HENT:  Negative for ear pain and sore throat.   Eyes:  Negative for pain and visual disturbance.  Respiratory:  Negative for cough and shortness of breath.   Cardiovascular:  Negative for chest pain and palpitations.  Gastrointestinal:  Negative for abdominal pain and vomiting.  Genitourinary:  Negative for dysuria and hematuria.  Musculoskeletal:  Positive for back pain. Negative for arthralgias.  Skin:  Negative for color change and rash.  Neurological:  Negative for seizures and syncope.  All other systems reviewed and are negative.    Physical Exam Triage Vital Signs ED Triage Vitals  Encounter Vitals Group     BP 09/20/23 1330 120/83     Systolic BP Percentile --      Diastolic BP Percentile --      Pulse Rate 09/20/23 1330 (!) 102     Resp 09/20/23 1330 20     Temp 09/20/23 1330 98.6 F (37 C)     Temp Source 09/20/23 1330 Oral     SpO2 09/20/23 1330 98 %     Weight --      Height --      Head Circumference --      Peak Flow --      Pain Score 09/20/23 1326 8     Pain Loc --      Pain Education --      Exclude from Growth Chart --    No data found.  Updated Vital  Signs BP 120/83 (BP Location: Left Arm) Comment (BP Location): regular cuff on forearm  Pulse (!) 102   Temp 98.6 F (37 C) (Oral)   Resp 20   LMP  (LMP Unknown) Comment: denies having any period  SpO2 98%   Visual Acuity Right Eye Distance:   Left Eye Distance:   Bilateral Distance:    Right Eye Near:   Left Eye Near:    Bilateral Near:     Physical Exam Vitals and nursing note reviewed.  Constitutional:      General: She is not in acute distress.    Appearance: She is well-developed. She is obese.  HENT:     Head: Normocephalic and atraumatic.  Eyes:     Conjunctiva/sclera: Conjunctivae normal.  Cardiovascular:     Rate and Rhythm: Normal rate and regular rhythm.     Heart sounds: No murmur heard. Pulmonary:     Effort: Pulmonary effort is normal. No respiratory distress.     Breath sounds: Normal breath sounds.  Abdominal:     Palpations: Abdomen is soft.     Tenderness: There is no abdominal tenderness.  Musculoskeletal:        General: No swelling.     Cervical back: Neck supple.  Skin:    General: Skin is warm and dry.     Capillary Refill: Capillary refill takes less than 2 seconds.  Neurological:     Mental Status: She is alert.  Psychiatric:        Mood and Affect: Mood normal.      UC Treatments / Results  Labs (all  labs ordered are listed, but only abnormal results are displayed) Labs Reviewed - No data to display  EKG   Radiology No results found.  Procedures Procedures (including critical care time)  Medications Ordered in UC Medications  ketorolac (TORADOL) 30 MG/ML injection 30 mg (has no administration in time range)  dexamethasone (DECADRON) injection 10 mg (has no administration in time range)    Initial Impression / Assessment and Plan / UC Course  I have reviewed the triage vital signs and the nursing notes.  Pertinent labs & imaging results that were available during my care of the patient were reviewed by me and considered  in my medical decision making (see chart for details).     Acute midline thoracic back pain   Recurrent upper back pain without new injury. Will treat with the following:   Injection of toradol for pain and decadron for inflammation done here today.  Prednisone taper, Take 6 tabs by mouth daily  for 2 days, then 5 tabs for 2 days, then 4 tabs for 2 days, then 3 tabs for 2 days, 2 tabs for 2 days, then 1 tab by mouth daily for 2 days Cyclobenzaprine 10 mg every 12 hours as needed for muscle spasms Heating pad or ice to help with discomfort Light stretching as tolerated Return to urgent care or PCP if symptoms worsen or fail to resolve.    Final Clinical Impressions(s) / UC Diagnoses   Final diagnoses:  Acute midline thoracic back pain     Discharge Instructions        Prednisone taper, Take 6 tabs by mouth daily  for 2 days, then 5 tabs for 2 days, then 4 tabs for 2 days, then 3 tabs for 2 days, 2 tabs for 2 days, then 1 tab by mouth daily for 2 days Cyclobenzaprine 10 mg every 12 hours as needed for muscle spasms Heating pad or ice to help with discomfort Light stretching as tolerated Return to urgent care or PCP if symptoms worsen or fail to resolve.     ED Prescriptions     Medication Sig Dispense Auth. Provider   cyclobenzaprine (FLEXERIL) 10 MG tablet Take 1 tablet (10 mg total) by mouth 2 (two) times daily as needed for muscle spasms. 20 tablet Payson Crumby A, PA-C   predniSONE (STERAPRED UNI-PAK 21 TAB) 10 MG (21) TBPK tablet Take by mouth daily. Take 6 tabs by mouth daily  for 2 days, then 5 tabs for 2 days, then 4 tabs for 2 days, then 3 tabs for 2 days, 2 tabs for 2 days, then 1 tab by mouth daily for 2 days 42 tablet Paulina Muchmore, Austin Miles, PA-C      PDMP not reviewed this encounter.   Landis Martins, [place] 09/20/23 1356

## 2023-09-20 NOTE — ED Triage Notes (Signed)
Reports back pain has worsened, "thoracic" back.  Patient reports a history of a fractured back in the past and has chronic pain.  Pain worsened 2 days.  Denies any new injury.    Has taken ibuprofen, tylenol and one remaining cyclobenzaprine was taken last night

## 2023-09-20 NOTE — Discharge Instructions (Addendum)
Recurrent upper back pain without new injury. Will treat with the following:   Injection of toradol for pain and decadron for inflammation done here today.  Prednisone taper, Take 6 tabs by mouth daily  for 2 days, then 5 tabs for 2 days, then 4 tabs for 2 days, then 3 tabs for 2 days, 2 tabs for 2 days, then 1 tab by mouth daily for 2 days Cyclobenzaprine 10 mg every 12 hours as needed for muscle spasms Heating pad or ice to help with discomfort Light stretching as tolerated Return to urgent care or PCP if symptoms worsen or fail to resolve.

## 2023-09-21 ENCOUNTER — Ambulatory Visit: Payer: BC Managed Care – PPO

## 2023-09-21 ENCOUNTER — Telehealth: Payer: Self-pay | Admitting: *Deleted

## 2023-09-21 NOTE — Telephone Encounter (Signed)
Pt was here for her depo injection but she's a week early. Appt re-schedule for next Tues 11/19 at the same time.

## 2023-09-22 ENCOUNTER — Other Ambulatory Visit: Payer: Self-pay | Admitting: Sports Medicine

## 2023-09-28 ENCOUNTER — Ambulatory Visit (INDEPENDENT_AMBULATORY_CARE_PROVIDER_SITE_OTHER): Payer: Medicaid Other | Admitting: *Deleted

## 2023-09-28 DIAGNOSIS — Z3042 Encounter for surveillance of injectable contraceptive: Secondary | ICD-10-CM

## 2023-09-28 MED ORDER — MEDROXYPROGESTERONE ACETATE 104 MG/0.65ML ~~LOC~~ SUSY
104.0000 mg | PREFILLED_SYRINGE | Freq: Once | SUBCUTANEOUS | Status: AC
Start: 2023-09-28 — End: 2023-09-28
  Administered 2023-09-28: 104 mg via SUBCUTANEOUS

## 2023-09-28 NOTE — Progress Notes (Signed)
This encounter was created in error - please disregard.

## 2023-09-29 ENCOUNTER — Telehealth: Payer: Self-pay | Admitting: *Deleted

## 2023-09-29 NOTE — Telephone Encounter (Signed)
I spoke with the patient by phone. She reports concern that depo provera was injected [place] instead of IM. Our nurse had a little trouble with the injection, partly due to obesity, but she remembers the 1 inch needle was fully inserted when she injected the depo. I recommended against a second dose due to increased risk of VTE. I warned about possibility of slight decreased effectiveness if the injection was [place]. If she has menorrhagia she can call us and we can consider treatment with something like megace or TXA.

## 2023-09-29 NOTE — Telephone Encounter (Signed)
Return pt's call who stated she received a Depo injection yesterday and noticed the nurse gave it underneath the skin like a TB skin test. The site is red. Pt stated this happened 2 X before (the same person) and her menstrual cycle came on which is life threatening to her. She does not want this to happen; she wants to know if she can get another Depo injection? I told her I will ask The Attending.

## 2023-09-29 NOTE — Telephone Encounter (Signed)
Dr Oswaldo Done stated he does not recommend a second injection d/t risk of blood clots. And he will call pt. Pt stated she talked to Dr Oswaldo Done.

## 2023-10-04 ENCOUNTER — Ambulatory Visit
Admission: RE | Admit: 2023-10-04 | Discharge: 2023-10-04 | Disposition: A | Discharge status: Home / Self Care | Payer: BC Managed Care – PPO | Source: Ambulatory Visit | Attending: Sports Medicine | Admitting: Sports Medicine

## 2023-10-04 DIAGNOSIS — G44319 Acute post-traumatic headache, not intractable: Secondary | ICD-10-CM

## 2023-10-04 DIAGNOSIS — G4700 Insomnia, unspecified: Secondary | ICD-10-CM

## 2023-10-04 DIAGNOSIS — S060X0D Concussion without loss of consciousness, subsequent encounter: Secondary | ICD-10-CM

## 2023-10-04 MED ORDER — GADOPICLENOL 0.5 MMOL/ML IV SOLN
10.0000 mL | Freq: Once | INTRAVENOUS | Status: AC | PRN
  Administered 2023-10-04: 10 mL via INTRAVENOUS

## 2023-10-12 ENCOUNTER — Encounter: Payer: Self-pay | Admitting: Sports Medicine

## 2023-10-13 ENCOUNTER — Encounter: Payer: BC Managed Care – PPO | Admitting: Sports Medicine

## 2023-10-13 NOTE — Progress Notes (Signed)
Office Visit Note  Patient: Jade Lopez             Date of Birth: December 28, 1995           MRN: [id number]             PCP: No primary care provider on file. Referring: Thomasene Ripple* Visit Date: 10/25/2023   Subjective:  Follow-up  History of Present Illness:  Discussed the use of AI scribe software for clinical note transcription with the patient, who gave verbal consent to proceed.  History of Present Illness   Jade Lopez is a 27 y.o. female here for follow up with history of lupus. They have completed a course of antibiotics for the breast infection. They were started on hydroxychloroquine 400 mg daily at the last visit to manage the joint inflammation. They report a slight improvement in symptoms, particularly in the left leg, which is now pain-free. However, they also note an increase in shoulder pain. They have experienced some nausea but are unsure if it is related to the medication. They deny any new illnesses or rashes, but mention a small light spot on the left thigh.  The patient also reports persistent pain in both shoulders, which has recently increased. The pain radiates down the arm but does not cause numbness or affect grip strength. They also report occasional itching and burning in the ears.  Previous HPI 08/30/2023 Jade Lopez is a 27 y.o. female here for follow up and with history of lupus.  Workup at our initial visit was negative for any disease specific antibodies but did have moderately elevated sedimentation rate and CRP.  Still having a lot of body pains and stiffness not localized very well to specific joints.  Still seeing leg swelling worse in the evening.  Worsening of the right breast cysts not diagnosed with abscess for which it sounds like she is going to need some drainage or debridement.   Previous HPI 07/29/23 Jade Lopez is a 27 y.o. female here for evaluation and management with history of lupus.  She was not seeing any  rheumatologist previously had been managed through primary care office with prolonged oral prednisone treatment.  From her history sounds like original diagnosis was based on joint inflammation and abnormal serology.  And more recently reviewed notes may have also experienced a course of intermittent fevers without clear underlying cause.  Also previous diagnosis of fibromyalgia syndrome and takes Lyrica 50 mg 3 times daily.  Currently has joint pain in multiple areas mostly worse on her right side throughout upper extremity and in the right knee.  She previously fractured the right ankle does not have as much pain in the site as an others.  She gets swelling in multiple areas legs are worse by the evening time. Besides joint pain multiple areas she has some frequent or recurrent skin lesions sounds like cysts or small abscesses in the axilla groin and breast areas.  Saw dermatology evaluation at some point and found to have some type of dermatitis but was not diagnosed as hidradenitis. Has chronic eye and mouth dryness not associated with dental complications vision change or eye inflammation.  Reports some hair loss or thinning not associated with any scalp rash or specific distribution. She does have chronic fatigue, concentration difficulty, dizziness, headaches, chronic diarrhea.     12/2022 ANA neg dsDNA neg   Review of Systems  Constitutional:  Positive for fatigue.  HENT:  Positive for mouth dryness. Negative for mouth sores.  Eyes:  Positive for dryness.  Respiratory:  Negative for shortness of breath.   Cardiovascular:  Negative for chest pain and palpitations.  Gastrointestinal:  Negative for blood in stool, constipation and diarrhea.  Endocrine: Positive for increased urination.  Genitourinary:  Negative for involuntary urination.  Musculoskeletal:  Positive for joint pain, joint pain, joint swelling, myalgias, morning stiffness, muscle tenderness and myalgias. Negative for gait  problem and muscle weakness.  Skin:  Positive for sensitivity to sunlight. Negative for color change, rash and hair loss.  Allergic/Immunologic: Positive for susceptible to infections.  Neurological:  Positive for dizziness and headaches.  Hematological:  Positive for swollen glands.  Psychiatric/Behavioral:  Positive for depressed mood and sleep disturbance. The patient is nervous/anxious.     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   Chronic post-traumatic headache, not intractable 10/28/2023   Hypersomnia with sleep apnea 10/28/2023   Visual changes 10/28/2023   Abnormal laboratory test result 07/29/2023   Traumatic brain injury (HCC) 07/14/2023   History of nausea and vomiting 05/19/2023   Fibromyalgia 04/11/2023   Abscess of right breast 04/03/2023   Abdominal wall pain 03/01/2023   Sleep related headaches 02/24/2023   OSA on CPAP 02/24/2023   Sleeps in sitting position due to orthopnea 02/24/2023   Menorrhagia 01/28/2023   Healthcare maintenance 01/28/2023   Chest pain 01/28/2023   Back pain 01/28/2023   Type 2 diabetes mellitus (HCC) 11/28/2022   History of systemic lupus erythematosus (HCC) 11/28/2022   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/28/2022   Endometriosis 11/28/2022   Anxiety 11/28/2022   Vitamin D deficiency 09/13/2012   Asthma 08/22/2012    Past Medical History:  Diagnosis Date   Asthma    Autoimmune disease (HCC)    Blood clots in brain 2020   Class 3 obesity    Endometriosis    Endometritis    Fibromyalgia    Near syncope 05/09/2023   Sleep apnea    Sleep related headaches 02/24/2023   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   T2DM (type 2 diabetes mellitus) (HCC)    TOA (tubo-ovarian abscess)     Family History  Problem Relation Age of Onset   Diabetes Mother    Hypertension Mother    Migraines Mother    Diabetes Father    Hypertension Father    Sleep apnea Father    Past  Surgical History:  Procedure Laterality Date   BREAST LUMPECTOMY Right 2024   CHOLECYSTECTOMY     DILATATION AND CURETTAGE/HYSTEROSCOPY WITH MINERVA     3 total   TONSILLECTOMY     TONSILLECTOMY AND ADENOIDECTOMY     Social History   Social History Narrative   Pt lives alone    Pt not working    Immunization History  Administered Date(s) Administered   Influenza, Seasonal, Injecte, Preservative Fre 08/08/2008   PPD Test 10/07/2022   Td 07/17/2015     Objective: Vital Signs: BP 126/79 (BP Location: Left Arm, Patient Position: Sitting, Cuff Size: Normal)   Pulse (!) 102   Resp 14   Ht 5' 4.5" (1.638 m)   Wt (!) 400 lb (181.4 kg)   BMI 67.60 kg/m    Physical Exam Constitutional:      Appearance: She is obese.  Eyes:     Conjunctiva/sclera: Conjunctivae normal.  Cardiovascular:     Rate and Rhythm: Normal rate and regular rhythm.  Pulmonary:  Effort: Pulmonary effort is normal.     Breath sounds: Normal breath sounds.  Lymphadenopathy:     Cervical: No cervical adenopathy.  Skin:    General: Skin is warm and dry.     Findings: Rash present.     Comments: Scattered scaly and hyperpigmented rash on periphery around face, in concha of ears  Neurological:     Mental Status: She is alert.  Psychiatric:        Mood and Affect: Mood normal.      Musculoskeletal Exam:  Shoulders full ROM, tenderness to pressure on top and sides, and across traps, no radiation Elbows full ROM no tenderness or swelling Wrists full ROM no tenderness or swelling Fingers full ROM no tenderness or swelling Knees full ROM no tenderness or swelling, right knee patellofemoral crepitus Ankles full ROM no tenderness or swelling   Investigation: No additional findings.  Imaging: No results found.   Recent Labs: Lab Results  Component Value Date   WBC 10.4 08/05/2023   HGB 12.5 08/05/2023   PLT 312 08/05/2023   NA 136 08/05/2023   K 4.0 08/05/2023   CL 103 08/05/2023   CO2 26  08/05/2023   GLUCOSE 164 (H) 08/05/2023   BUN 9 08/05/2023   CREATININE 0.76 08/05/2023   BILITOT 0.4 05/09/2023   ALKPHOS 92 05/09/2023   AST 11 (L) 05/09/2023   ALT 16 05/09/2023   PROT 7.1 05/09/2023   ALBUMIN 3.3 (L) 05/09/2023   CALCIUM 9.0 08/05/2023    Speciality Comments: No specialty comments available.  Procedures:  No procedures performed Allergies: Clindamycin/lincomycin, Gabapentin, Other, Pregabalin, Chlorpheniramine-phenylephrine, Cephalexin, Meloxicam, and Clindamycin   Assessment / Plan:     Visit Diagnoses: Seronegative rheumatoid arthritis (HCC) - Plan: Sedimentation rate, C-reactive protein, C3 and C4 Negative serology besides inflammatory markers, no objective synovitis again on exam. Mild improvement in symptoms with Hydroxychloroquine. No significant side effects reported. -Continue Hydroxychloroquine. -Order labs to assess inflammatory markers ESR, CRP, complements (CRP was 58 in September). -Consider discontinuing Hydroxychloroquine if labs do not show significant improvement in inflammation.  High risk medication use - hydroxychloroquine 400 mg daily. Needs a PLQ eye exam.  Fibromyalgia Possible musculoskeletal etiology. FMS pain history also likely contributing muscular or myofascial pain. Pain with movement but full range of motion and no localized joint changes. -Monitor symptoms. Consider further evaluation if symptoms persist or worsen.     Breast Abscess Completed course of Doxycycline. No current symptoms reported. -Monitor for recurrence of symptoms.  Orders: Orders Placed This Encounter  Procedures   Sedimentation rate   C-reactive protein   C3 and C4   No orders of the defined types were placed in this encounter.    Follow-Up Instructions: Return in about 6 months (around 04/24/2024) for ?RA/?SLE on HCQ f/u 6mos.   Fuller Plan, MD  Note - This record has been created using [employer].  Chart creation errors have been  sought, but may not always  have been located. Such creation errors do not reflect on  the standard of medical care.

## 2023-10-14 ENCOUNTER — Ambulatory Visit
Admission: EM | Admit: 2023-10-14 | Discharge: 2023-10-14 | Disposition: A | Discharge status: Home / Self Care | Payer: Medicaid Other | Attending: Internal Medicine | Admitting: Internal Medicine

## 2023-10-14 ENCOUNTER — Telehealth: Payer: Self-pay

## 2023-10-14 DIAGNOSIS — Z9889 Other specified postprocedural states: Secondary | ICD-10-CM

## 2023-10-14 NOTE — Discharge Instructions (Signed)
Please follow-up with your surgeon tomorrow at your scheduled appointment.  Please go to the ER if you develop any worsening symptoms prior to seeing your surgeon.

## 2023-10-14 NOTE — ED Triage Notes (Signed)
Pt presents to UC for right breast pain. Pt reports the pain is sharp and constant. She had surgery 4 days ago to remove an abscess. Pt reports the area around it is hard. Pt states she was told to go to [facility] for follow up but does not have anyone to drive her. Pt was taking Hydrocodone which she finished. She has also been taking Ibuprofen and tylenol

## 2023-10-14 NOTE — Telephone Encounter (Signed)
Per Medical Providers request, I called [facility] at [facility].   I let them know Jade Lopez is here to have her rt breast evaluated due to onset of pain post surgery and that pt is requesting more pain medicine. The RN I spoke to talked to Hanley Ben, MD; per Dr. Nicole Cella she is refilling oxycodone and is wanting to see her 12/6 if possible. Pt is scheduled for 12/6 at 3pm for evaluation by [facility]. Pt aware of medication refill as well as appt date and time. Pt advised to call the office if not able to make the appt. She stated she will be calling her insurance company to see if transportation can be arranged for her to be taken to the appt.

## 2023-10-14 NOTE — ED Provider Notes (Signed)
[facility]    CSN: [id number] Arrival date & time: 10/14/23  1523      History   Chief Complaint No chief complaint on file.   HPI Jade Lopez is a 27 y.o. female presents for breast pain.  Patient had a right breast lumpectomy/abscess removal on 12/2.  She states she was started on antibiotics prior to but not postop.  She has not had a follow-up with the surgeon yet.  She states she ran out of her pain medication and is requesting a refill.  She also was concerned that the area feels "firmer" than it did initially.  Denies any redness, swelling, drainage, fevers.  No other concerns at this time.  HPI  Past Medical History:  Diagnosis Date   Asthma    Autoimmune disease (HCC)    Blood clots in brain 2020   Class 3 obesity    Endometriosis    Endometritis    Fibromyalgia    Near syncope 05/09/2023   Sleep apnea    Sleep related headaches 02/24/2023   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   T2DM (type 2 diabetes mellitus) (HCC)    TOA (tubo-ovarian abscess)     Patient Active Problem List   Diagnosis Date Noted   Abnormal laboratory test result 07/29/2023   Traumatic brain injury (HCC) 07/14/2023   History of nausea and vomiting 05/19/2023   Fibromyalgia 04/11/2023   Abscess of right breast 04/03/2023   Abdominal wall pain 03/01/2023   Sleep related headaches 02/24/2023   OSA on CPAP 02/24/2023   Sleeps in sitting position due to orthopnea 02/24/2023   Menorrhagia 01/28/2023   Healthcare maintenance 01/28/2023   Chest pain 01/28/2023   Back pain 01/28/2023   Type 2 diabetes mellitus (HCC) 11/28/2022   History of systemic lupus erythematosus (HCC) 11/28/2022   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/28/2022   Endometriosis 11/28/2022   Anxiety 11/28/2022   Vitamin D deficiency 09/13/2012   Asthma 08/22/2012    Past Surgical History:  Procedure Laterality Date   BREAST LUMPECTOMY Right 2024   CHOLECYSTECTOMY     DILATATION AND CURETTAGE/HYSTEROSCOPY WITH MINERVA     3 total    TONSILLECTOMY     TONSILLECTOMY AND ADENOIDECTOMY      OB History     Gravida  0   Para  0   Term  0   Preterm  0   AB  0   Living  0      SAB  0   IAB  0   Ectopic  0   Multiple  0   Live Births  0            Home Medications    Prior to Admission medications   Medication Sig Start Date End Date Taking? Authorizing Provider  Accu-Chek Softclix Lancets lancets daily. 12/15/22   [provider]  albuterol (VENTOLIN HFA) 108 (90 Base) MCG/ACT inhaler Inhale 1-2 puffs into the lungs every 6 (six) hours as needed for wheezing or shortness of breath. 05/10/23   Marinda Elk, MD  Blood Glucose Monitoring Suppl (MM EASY TOUCH GLUCOSE METER) w/Device KIT Please check your blood sugar daily, in the morning. 12/08/22   Marolyn Haller, MD  Blood Glucose Monitoring Suppl DEVI 1 each by Does not apply route 3 (three) times daily. May dispense any manufacturer covered by patient's insurance. 05/10/23   Marinda Elk, MD  budesonide-formoterol (SYMBICORT) 80-4.5 MCG/ACT inhaler Inhale 2 puffs into the lungs 2 (two) times daily as  needed. 05/19/23   Kathleen Lime, MD  busPIRone (BUSPAR) 10 MG tablet Take 3 tablets (30 mg total) by mouth daily. Patient not taking: Reported on 09/20/2023 11/25/22   Marolyn Haller, MD  busPIRone (BUSPAR) 15 MG tablet Take 15 mg by mouth 2 (two) times daily. Patient not taking: Reported on 09/20/2023 06/22/23   [provider]  cyclobenzaprine (FLEXERIL) 10 MG tablet Take 1 tablet (10 mg total) by mouth 2 (two) times daily as needed for muscle spasms. 09/20/23   White, Elizabeth A, PA-C  fluconazole (DIFLUCAN) 150 MG tablet Take 1 tablet (150 mg total) by mouth every 3 (three) days. 09/09/23   Wallis Bamberg, PA-C  Glucose Blood (BLOOD GLUCOSE TEST STRIPS) STRP 1 each by Does not apply route 3 (three) times daily. Use as directed to check blood sugar. May dispense any manufacturer covered by patient's insurance and fits  patient's device. 05/10/23   Marinda Elk, MD  hydroxychloroquine (PLAQUENIL) 200 MG tablet Take 2 tablets (400 mg total) by mouth daily. 08/30/23   Fuller Plan, MD  Lancet Device MISC 1 each by Does not apply route 3 (three) times daily. May dispense any manufacturer covered by patient's insurance. 05/10/23   Marinda Elk, MD  Lancets MISC 1 each by Does not apply route 3 (three) times daily. Use as directed to check blood sugar. May dispense any manufacturer covered by patient's insurance and fits patient's device. 05/10/23   Marinda Elk, MD  medroxyPROGESTERone (DEPO-PROVERA) 150 MG/ML injection Inject 150 mg into the muscle every 3 (three) months.    [provider]  naratriptan (AMERGE) 2.5 MG tablet 2.5 mg. 07/15/22   [provider]  norethindrone (AYGESTIN) 5 MG tablet Take 10 mg by mouth every evening. 12/11/22   [provider]  ondansetron (ZOFRAN-ODT) 8 MG disintegrating tablet Take 1 tablet (8 mg total) by mouth every 8 (eight) hours as needed for nausea or vomiting. 05/17/23   [name], NP  predniSONE (STERAPRED UNI-PAK 21 TAB) 10 MG (21) TBPK tablet Take by mouth daily. Take 6 tabs by mouth daily  for 2 days, then 5 tabs for 2 days, then 4 tabs for 2 days, then 3 tabs for 2 days, 2 tabs for 2 days, then 1 tab by mouth daily for 2 days 09/20/23   Doristine Mango A, PA-C  pregabalin (LYRICA) 50 MG capsule Take 1 capsule (50 mg total) by mouth 3 (three) times daily. Patient not taking: Reported on 09/20/2023 05/19/23   Kathleen Lime, MD  tinidazole [facility]) 500 MG tablet Take 2 tablets (1,000 mg total) by mouth daily with breakfast. 09/09/23   Wallis Bamberg, PA-C  tirzepatide [facility]) 5 MG/0.5ML Pen Inject 5 mg into the skin once a week. 08/05/23   [provider]  traZODone (DESYREL) 300 MG tablet Take 1 tablet (300 mg total) by mouth at bedtime. 09/22/23   Richardean Sale, DO    Family History Family History  Problem  Relation Age of Onset   Diabetes Mother    Hypertension Mother    Diabetes Father    Hypertension Father    Sleep apnea Father     Social History Social History   Tobacco Use   Smoking status: Never    Passive exposure: Never   Smokeless tobacco: Never  Vaping Use   Vaping status: Never Used  Substance Use Topics   Alcohol use: Yes    Comment: occ   Drug use: No     Allergies  Clindamycin/lincomycin, Gabapentin, Other, Chlorpheniramine-phenylephrine, Pregabalin, and Cephalexin   Review of Systems Review of Systems  Skin:        Right breast pain status post lumpectomy     Physical Exam Triage Vital Signs ED Triage Vitals  Encounter Vitals Group     BP 10/14/23 1540 127/87     Systolic BP Percentile --      Diastolic BP Percentile --      Pulse Rate 10/14/23 1540 96     Resp 10/14/23 1540 18     Temp 10/14/23 1540 98.3 F (36.8 C)     Temp Source 10/14/23 1540 Oral     SpO2 10/14/23 1540 97 %     Weight --      Height --      Head Circumference --      Peak Flow --      Pain Score 10/14/23 1537 9     Pain Loc --      Pain Education --      Exclude from Growth Chart --    No data found.  Updated Vital Signs BP 127/87 (BP Location: Left Wrist)   Pulse 96   Temp 98.3 F (36.8 C) (Oral)   Resp 18   LMP  (LMP Unknown) Comment: denies having any period  SpO2 97%   Visual Acuity Right Eye Distance:   Left Eye Distance:   Bilateral Distance:    Right Eye Near:   Left Eye Near:    Bilateral Near:     Physical Exam Vitals and nursing note reviewed.  Constitutional:      General: She is not in acute distress.    Appearance: Normal appearance. She is not ill-appearing.  HENT:     Head: Normocephalic and atraumatic.  Eyes:     Pupils: Pupils are equal, round, and reactive to light.  Cardiovascular:     Rate and Rhythm: Normal rate.  Pulmonary:     Effort: Pulmonary effort is normal.  Chest:       Comments: There is a surgical scar to the  right breast from the medial aspect of the areola extending medially.  Sutures in place.  No erythema, warmth, drainage.  Area is tender to palpation.  No nipple drainage.  No induration or fluctuance. Skin:    General: Skin is warm and dry.  Neurological:     General: No focal deficit present.     Mental Status: She is alert and oriented to person, place, and time.  Psychiatric:        Mood and Affect: Mood normal.        Behavior: Behavior normal.      UC Treatments / Results  Labs (all labs ordered are listed, but only abnormal results are displayed) Labs Reviewed - No data to display  EKG   Radiology No results found.  Procedures Procedures (including critical care time)  Medications Ordered in UC Medications - No data to display  Initial Impression / Assessment and Plan / UC Course  I have reviewed the triage vital signs and the nursing notes.  Pertinent labs & imaging results that were available during my care of the patient were reviewed by me and considered in my medical decision making (see chart for details).     I reviewed concerns and exam with patient.  Clinical staff was able to contact patient's surgeon office who will send in a refill of her pain medication.  They also set up an appointment  for her for tomorrow for postop follow-up.  Discussed with patient there is no signs of infection and that she can pick up her pain medicine sent in by the surgeon at her pharmacy.  She should follow-up tomorrow at her scheduled appointment with the surgeon.  ER precautions reviewed. Final Clinical Impressions(s) / UC Diagnoses   Final diagnoses:  S/P breast lumpectomy     Discharge Instructions      Please follow-up with your surgeon tomorrow at your scheduled appointment.  Please go to the ER if you develop any worsening symptoms prior to seeing your surgeon.   ED Prescriptions   None    PDMP not reviewed this encounter.   [name], NP 10/14/23  [phone]

## 2023-10-19 NOTE — Progress Notes (Unsigned)
Aleen Sells D.[name] [address] [place] [address] Phone: [phone]  Assessment and Plan:     There are no diagnoses linked to this encounter.  ***    Date of injury was 8/24 and 07/05/2023.  Symptom severity scores of *** and *** today.  Original symptom severity scores were 22 and 86.   Recommendations:  -  Goal of sleeping a minimum of 7-8 continuous hours nightly - Recommend light physical activity for 15-30 minutes a day while keeping symptoms less than 3/10 - Stop mental or physical activities that cause symptoms to worsen greater than 3/10, and wait 24 hours before attempting them again - Eliminate screen time as much as possible for first 48 hours after concussive event, then continue limited screen time (recommend less than 2 hours per day)  Pertinent previous records reviewed include ***    - Encouraged to RTC in *** for reassessment or sooner for any concerns or acute changes    Time of visit *** minutes, which included chart review, physical exam, treatment plan, symptom severity score, VOMS, and tandem gait testing being performed, interpreted, and discussed with patient at today's visit.   Subjective:   I, [name], am serving as a Neurosurgeon for Doctor Richardean Sale   Chief Complaint: concussion symptoms    HPI:    07/29/23 Patient is a 27 year old female complaining of concussion symptoms. Patient states third encounter after hitting her had on a doorframe on 8/24, then hitting her head on a desk on 8/26. Patient reports continued symptoms since last visit with only mild improvement. She has a chronic headache with intermittent periods of more severe pain. Tylenol and ibuprofen are only moderately helpful. She states she has memory loss such as forgetting which day it is, intermittent blurry vision, some nausea with no vomiting, occasional lightheadedness.    08/12/2023 Patient states she has been really stressed with  life . Has been having to adjust to medications . Trazodone hasn't really helped out much with sleep only about 2 days of 7-8 hours of sleep interrupted.    09/09/2023 Patient states she feels like crap. Hasn't been back to work. Is stressed out . Headaches and isnt able to sleep but thinks that could be due to anxiety.   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 No Diagnosed/treated for headache disorder, migraines, or seizures?yes  Diagnosed with learning disability [name]? Yes dyslexia  Diagnosed with ADD/ADHD? no Diagnose with Depression, anxiety, or other Psychiatric Disorder? Yes    Current medications:  Current Outpatient Medications  Medication Sig Dispense Refill   Accu-Chek Softclix Lancets lancets daily.     albuterol (VENTOLIN HFA) 108 (90 Base) MCG/ACT inhaler Inhale 1-2 puffs into the lungs every 6 (six) hours as needed for wheezing or shortness of breath. 1 each 1   Blood Glucose Monitoring Suppl (MM EASY TOUCH GLUCOSE METER) w/Device KIT Please check your blood sugar daily, in the morning. 1 kit 0   Blood Glucose Monitoring Suppl DEVI 1 each by Does not apply route 3 (three) times daily. May  dispense any manufacturer covered by patient's insurance. 1 each 0   budesonide-formoterol (SYMBICORT) 80-4.5 MCG/ACT inhaler Inhale 2 puffs into the lungs 2 (two) times daily as needed. 1 each 12   busPIRone (BUSPAR) 10 MG tablet Take 3 tablets (30 mg total) by mouth daily. (Patient not taking: Reported on 09/20/2023) 30 tablet 5   busPIRone (BUSPAR) 15 MG tablet Take 15 mg by mouth 2 (two) times daily. (Patient not taking: Reported on  09/20/2023)     cyclobenzaprine (FLEXERIL) 10 MG tablet Take 1 tablet (10 mg total) by mouth 2 (two) times daily as needed for muscle spasms. 20 tablet 1   fluconazole (DIFLUCAN) 150 MG tablet Take 1 tablet (150 mg total) by mouth every 3 (three) days. 5 tablet 0   Glucose Blood (BLOOD GLUCOSE TEST STRIPS) STRP 1 each by Does not apply route 3 (three) times daily. Use as directed to check blood sugar. May dispense any manufacturer covered by patient's insurance and fits patient's device. 100 strip 0   hydroxychloroquine (PLAQUENIL) 200 MG tablet Take 2 tablets (400 mg total) by mouth daily. 60 tablet 2   Lancet Device MISC 1 each by Does not apply route 3 (three) times daily. May dispense any manufacturer covered by patient's insurance. 1 each 0   Lancets MISC 1 each by Does not apply route 3 (three) times daily. Use as directed to check blood sugar. May dispense any manufacturer covered by patient's insurance and fits patient's device. 100 each 0   medroxyPROGESTERone (DEPO-PROVERA) 150 MG/ML injection Inject 150 mg into the muscle every 3 (three) months.     naratriptan (AMERGE) 2.5 MG tablet 2.5 mg.     norethindrone (AYGESTIN) 5 MG tablet Take 10 mg by mouth every evening.     ondansetron (ZOFRAN-ODT) 8 MG disintegrating tablet Take 1 tablet (8 mg total) by mouth every 8 (eight) hours as needed for nausea or vomiting. 20 tablet 0   predniSONE (STERAPRED UNI-PAK 21 TAB) 10 MG (21) TBPK tablet Take by mouth daily. Take 6 tabs by mouth daily  for 2 days, then 5 tabs for 2 days, then 4 tabs for 2 days, then 3 tabs for 2 days, 2 tabs for 2 days, then 1 tab by mouth daily for 2 days 42 tablet 0   pregabalin (LYRICA) 50 MG capsule Take 1 capsule (50 mg total) by mouth 3 (three) times daily. (Patient not taking: Reported on 09/20/2023) 90 capsule 3   tinidazole (TINDAMAX) 500 MG tablet Take 2 tablets (1,000 mg total) by mouth daily with breakfast. 10 tablet 0   tirzepatide (MOUNJARO) 5 MG/0.5ML Pen Inject  5 mg into the skin once a week.     traZODone (DESYREL) 300 MG tablet Take 1 tablet (300 mg total) by mouth at bedtime. 30 tablet 0   No current facility-administered medications for this visit.      Objective:     There were no vitals filed for this visit.    There is no height or weight on file to calculate BMI.    Physical Exam:     General: Well-appearing, cooperative, sitting comfortably in no acute distress.  Psychiatric: Mood and affect are appropriate.   Neuro:sensation intact and strength 5/5 with no deficits, no atrophy, normal muscle tone   Today's Symptom Severity Score:  Scores: 0-6  Headache:*** "Pressure in head":***  Neck Pain:*** Nausea or vomiting:*** Dizziness:*** Blurred vision:*** Balance problems:*** Sensitivity to light:*** Sensitivity to noise:*** Feeling slowed down:*** Feeling like "in a fog":*** "  Don't feel right":*** Difficulty concentrating:*** Difficulty remembering:***  Fatigue or low energy:*** Confusion:***  Drowsiness:***  More emotional:*** Irritability:*** Sadness:***  Nervous or Anxious:*** Trouble falling or staying asleep:***  Total number of symptoms: ***/22  Symptom Severity index: ***/132  Worse with physical activity? No*** Worse with mental activity? No*** Percent improved since injury: ***%    Full pain-free cervical PROM: yes***    Cognitive:  - Months backwards: *** Mistakes. *** seconds  mVOMS:   - Baseline symptoms: *** - Horizontal Vestibular-Ocular Reflex: ***/10  - Smooth pursuits: ***/10  - Horizontal Saccades:  ***/10  - Visual Motion Sensitivity Test:  ***/10  - Convergence: ***cm (<5 cm normal)    Autonomic:  - Symptomatic with supine to standing: No***  Complex Tandem Gait: - Forward, eyes open: *** errors - Backward, eyes open: *** errors - Forward, eyes closed: *** errors - Backward, eyes closed: *** errors  Electronically signed by:  Aleen Sells D.[name] 11:33 AM 10/19/23

## 2023-10-20 ENCOUNTER — Ambulatory Visit: Payer: Medicaid Other | Admitting: Sports Medicine

## 2023-10-20 VITALS — HR 101 | Ht 64.0 in | Wt 398.0 lb

## 2023-10-20 DIAGNOSIS — F411 Generalized anxiety disorder: Secondary | ICD-10-CM

## 2023-10-20 DIAGNOSIS — G8929 Other chronic pain: Secondary | ICD-10-CM

## 2023-10-20 DIAGNOSIS — R519 Headache, unspecified: Secondary | ICD-10-CM

## 2023-10-20 DIAGNOSIS — G4700 Insomnia, unspecified: Secondary | ICD-10-CM | POA: Diagnosis not present

## 2023-10-20 DIAGNOSIS — E236 Other disorders of pituitary gland: Secondary | ICD-10-CM | POA: Diagnosis not present

## 2023-10-20 NOTE — Patient Instructions (Signed)
Neurology referral  As needed follow up

## 2023-10-25 ENCOUNTER — Ambulatory Visit: Payer: Medicaid Other | Attending: Internal Medicine | Admitting: Internal Medicine

## 2023-10-25 ENCOUNTER — Encounter: Payer: Self-pay | Admitting: Internal Medicine

## 2023-10-25 VITALS — BP 126/79 | HR 102 | Resp 14 | Ht 64.5 in | Wt >= 6400 oz

## 2023-10-25 DIAGNOSIS — M329 Systemic lupus erythematosus, unspecified: Secondary | ICD-10-CM | POA: Diagnosis not present

## 2023-10-25 DIAGNOSIS — Z79899 Other long term (current) drug therapy: Secondary | ICD-10-CM | POA: Diagnosis not present

## 2023-10-25 DIAGNOSIS — M0600 Rheumatoid arthritis without rheumatoid factor, unspecified site: Secondary | ICD-10-CM

## 2023-10-25 DIAGNOSIS — M797 Fibromyalgia: Secondary | ICD-10-CM

## 2023-10-26 LAB — C-REACTIVE PROTEIN: CRP: 40.4 mg/L — ABNORMAL HIGH (ref <8.0)

## 2023-10-26 LAB — SEDIMENTATION RATE: Sed Rate: 67 mm/h — ABNORMAL HIGH (ref 0–20)

## 2023-10-26 LAB — C3 AND C4
C3 Complement: 222 mg/dL — ABNORMAL HIGH (ref 83–193)
C4 Complement: 32 mg/dL (ref 15–57)

## 2023-10-28 ENCOUNTER — Ambulatory Visit (INDEPENDENT_AMBULATORY_CARE_PROVIDER_SITE_OTHER): Payer: Medicaid Other | Admitting: Neurology

## 2023-10-28 ENCOUNTER — Other Ambulatory Visit: Payer: Self-pay | Admitting: *Deleted

## 2023-10-28 ENCOUNTER — Encounter: Payer: Self-pay | Admitting: Neurology

## 2023-10-28 VITALS — BP 115/78 | HR 110 | Ht 64.0 in | Wt >= 6400 oz

## 2023-10-28 DIAGNOSIS — G44329 Chronic post-traumatic headache, not intractable: Secondary | ICD-10-CM | POA: Diagnosis not present

## 2023-10-28 DIAGNOSIS — E236 Other disorders of pituitary gland: Secondary | ICD-10-CM

## 2023-10-28 DIAGNOSIS — H539 Unspecified visual disturbance: Secondary | ICD-10-CM | POA: Diagnosis not present

## 2023-10-28 DIAGNOSIS — E669 Obesity, unspecified: Secondary | ICD-10-CM | POA: Insufficient documentation

## 2023-10-28 DIAGNOSIS — G4710 Hypersomnia, unspecified: Secondary | ICD-10-CM | POA: Diagnosis not present

## 2023-10-28 DIAGNOSIS — G4730 Sleep apnea, unspecified: Secondary | ICD-10-CM

## 2023-10-28 NOTE — Patient Instructions (Signed)
Idiopathic Intracranial Hypertension  Idiopathic intracranial hypertension (IIH) is a condition that increases pressure around the brain. The fluid that surrounds the brain and spinal cord (cerebrospinal fluid, or CSF) increases and causes the pressure. Idiopathic means that the cause of this condition is not known. IIH affects the brain and spinal cord. If this condition is not treated, it can cause vision loss or blindness. What are the causes? The cause of this condition is not known. What increases the risk? The following factors may make you more likely to develop this condition: Being obese. Being a person who is female, between the ages of 76 and 62 years old, and who has not gone through menopause. Taking certain medicines, such as birth control, acne medicines, or steroids. What are the signs or symptoms? Symptoms of this condition include: Headaches. This is the most common symptom. Brief periods of total blindness. Double vision, blurred vision, or poor side (peripheral) vision. Pain in the shoulders or neck. Nausea and vomiting. A sound like rushing water or a pulsing sound within the ears (pulsatile tinnitus), or ringing in the ears. How is this diagnosed? This condition may be diagnosed based on: Your symptoms and medical history. Imaging tests of the brain, such as: CT scan. MRI. Magnetic resonance venogram (MRV) to check the veins. Diagnostic lumbar puncture. This is a procedure to remove and examine a sample of CSF. This procedure can determine whether your fluid pressure is too high. An eye exam to check for swelling or nerve damage in the eyes. How is this treated? Treatment for this condition depends on the symptoms. The goal of treatment is to decrease the pressure around your brain. Common treatments include: Weight loss through healthy eating, salt restriction, and exercise, if you are overweight. Medicines to decrease the production of CSF and lower the pressure  within your skull. Medicines to prevent or treat headaches. Other treatments may include: Surgery to place drains (shunts) in your brain to remove extra fluid. Lumbar puncture to remove extra CSF. Follow these instructions at home: If you are overweight or obese, work with your health care provider to lose weight. Take over-the-counter and prescription medicines only as told by your health care provider. Ask your health care provider if the medicine prescribed to you requires you to avoid driving or using machinery. Do not use any products that contain nicotine or tobacco. These products include cigarettes, chewing tobacco, and vaping devices, such as e-cigarettes. If you need help quitting, ask your health care provider. Keep all follow-up visits. Your health care provider will need to monitor you regularly. Contact a health care provider if: You have changes in your vision, such as: Double vision. Blurred vision. Poor peripheral vision. Get help right away if: You have any of the following symptoms and they get worse or do not get better: Headaches. Nausea. Vomiting. Sudden trouble seeing. This information is not intended to replace advice given to you by your health care provider. Make sure you discuss any questions you have with your health care provider. Document Revised: 03/24/2022 Document Reviewed: 03/03/2022 Elsevier Patient Education  2024 [employer].

## 2023-10-28 NOTE — Progress Notes (Signed)
Provider:  Melvyn Novas, MD  Primary Care Physician:  Manuela Neptune, MD [address] [place] [address]     Referring Provider: Richardean Sale, Do [address] [place],  [place] [address]          Chief Complaint according to patient   Patient presents with:     New problem in a recently seen Sleep apnea Patient (Initial Visit), here for headaches.  Referred to Neurology for new problem.            HISTORY OF PRESENT ILLNESS:  Jade Lopez is a 27 y.o. female patient who is here for revisit 10/28/2023 for  HEADACHES.Marland Kitchen posttraumatic , 07-05-2023 date of injury.  Chief concern according to patient :  " I have had headaches ever since I hit my head at work, shooting pain , through the crown, outside inwards.  Sudden pain,severe, "pain will go away after 3 minutes but lasts longer when I am stressed ", After 12 noon most often, while active - or when stressed. " These are not my usual migraines.  Frequent: 5-6 times a day.  Nausea sometimes, has underlying photophobia anyway, computer screen can be a trigger, Phonophobia.    She was seen here in April for a SLEEP consultation,and has undergone a HST, received a new CPAP machine.  No data here for new CPAP.    Last encounter was a Sleep consultation : 02/24/2023 from PCP.  Chief concern according to patient :  " I was diagnosed at age 40 with OSA, used CPAP ever since."  I have the pleasure of seeing Jade Lopez 02/24/23 an AA-27 year-old female with a possible sleep disorder.    The patient had the first sleep study in the year 2010 in [place], [place],  with a result of OSA , a follow up study was done in [place]. Current settings ramping from 8-15 cm water. Her machine is 27 years OLD !!!!   Sleep relevant medical history: Sleepiness, fatigue, Morbid obesity, no Nocturia, had a Tonsillectomy, no cervical spine surgery. She reports that she chokes often after drink or food intake.  History of GERD.     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 She lives in a household with sister and brother - in Social worker. One cat.  The patient currently works in daytime/ but used to work in shifts( Chief Technology Officer,) until 2022   Tobacco use; none .  ETOH use ; yes, 2/ month,  Caffeine intake in form of Coffee( 4/ day ) Soda( /) Tea ( /) or energy drinks Exercise in form of swimming.       Sleep habits are as follows: The patient's dinner time is between 8 PM. The patient goes to bed at 10 PM and continues to sleep for 5 hours, wakes and goes to sleep again for a total 7-8 .  The preferred sleep position is dictated by orthopnoea , with the support of 10 pillows.  Dreams are reportedly rare/ infrequent. The patient wakes up with an alarm. Multiple alarms at 6, 7.30 and 7.45,  7. 30-   AM is the usual rise time.She reports not feeling refreshed or restored in AM, with symptoms such as dry mouth, morning headaches, and residual  fatigue. She has often migraines in AM, often so severe that it leads to ED visits.  Naps are taken infrequently, they are less refreshing than nocturnal sleep.     Review of Systems: Out of a complete 14 system review, the patient complains of only the following symptoms, and all other reviewed systems are negative.:  Fatigue, sleepiness , snoring, fragmented sleep, Insomnia, RLS, Nocturia    How likely are you to doze in the following situations: 0 = not likely, 1 = slight chance, 2 = moderate chance, 3 = high chance   Sitting and Reading? Watching Television? Sitting inactive in a public place (theater or meeting)? As a passenger in a car for an hour without a break? Lying down in the afternoon when circumstances permit? Sitting and talking to someone? Sitting quietly after  lunch without alcohol? In a car, while stopped for a few minutes in traffic?   Social History   Socioeconomic History   Marital status: Single    Spouse name: Not on file   Number of children: Not on file   Years of education: Not on file   Highest education level: Not on file  Occupational History   Not on file  Tobacco Use   Smoking status: Never    Passive exposure: Never   Smokeless tobacco: Never  Vaping Use   Vaping status: Never Used  Substance and Sexual Activity   Alcohol use: Not Currently    Comment: occ   Drug use: No   Sexual activity: Yes    Birth control/protection: Injection, Pill  Other Topics Concern   Not on file  Social History Narrative   Pt lives alone    Pt not working    Social Drivers of Corporate investment banker Strain: Not on file  Food Insecurity: Medium Risk (08/04/2023)   Received from [facility]   Hunger Vital Sign    Worried About Running Out of Food in the Last Year: Sometimes true    Ran Out of Food in the Last Year: Sometimes true  Transportation Needs: Not on file (08/04/2023)  Physical Activity: Not on file  Stress: Not on file  Social Connections: Socially Isolated (12/14/2022)   Social Connection and Isolation Panel [NHANES]    Frequency of Communication with Friends and Family: More than three times a week    Frequency of Social Gatherings with Friends and Family: More than three times a week    Attends Religious Services: Never    Database administrator or Organizations: No    Attends Engineer, structural: Never    Marital Status: Never married    Family History  Problem Relation Age of Onset   Diabetes Mother    Hypertension Mother    Migraines Mother    Diabetes Father    Hypertension Father    Sleep apnea Father     Past Medical History:  Diagnosis Date   Asthma    Autoimmune disease (HCC)    Blood clots in brain 2020   Class 3 obesity    Endometriosis    Endometritis    Fibromyalgia    Near  syncope 05/09/2023   Sleep apnea    Sleep related headaches 02/24/2023   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   T2DM (type 2 diabetes mellitus) (HCC)    TOA (tubo-ovarian abscess)     Past Surgical History:  Procedure Laterality Date   BREAST LUMPECTOMY Right 2024   CHOLECYSTECTOMY     DILATATION AND CURETTAGE/HYSTEROSCOPY  WITH MINERVA     3 total   TONSILLECTOMY     TONSILLECTOMY AND ADENOIDECTOMY       Current Outpatient Medications on File Prior to Visit  Medication Sig Dispense Refill   Accu-Chek Softclix Lancets lancets daily.     albuterol (VENTOLIN HFA) 108 (90 Base) MCG/ACT inhaler Inhale 1-2 puffs into the lungs every 6 (six) hours as needed for wheezing or shortness of breath. 1 each 1   ASHWAGANDHA PO Take by mouth.     BIOTIN PO Take by mouth.     Blood Glucose Monitoring Suppl (MM EASY TOUCH GLUCOSE METER) w/Device KIT Please check your blood sugar daily, in the morning. 1 kit 0   Blood Glucose Monitoring Suppl DEVI 1 each by Does not apply route 3 (three) times daily. May dispense any manufacturer covered by patient's insurance. 1 each 0   budesonide-formoterol (SYMBICORT) 80-4.5 MCG/ACT inhaler Inhale 2 puffs into the lungs 2 (two) times daily as needed. 1 each 12   busPIRone (BUSPAR) 10 MG tablet Take 3 tablets (30 mg total) by mouth daily. 30 tablet 5   busPIRone (BUSPAR) 15 MG tablet Take 15 mg by mouth 2 (two) times daily.     cyclobenzaprine (FLEXERIL) 10 MG tablet Take 1 tablet (10 mg total) by mouth 2 (two) times daily as needed for muscle spasms. 20 tablet 1   Glucose Blood (BLOOD GLUCOSE TEST STRIPS) STRP 1 each by Does not apply route 3 (three) times daily. Use as directed to check blood sugar. May dispense any manufacturer covered by patient's insurance and fits patient's device. 100 strip 0   hydroxychloroquine (PLAQUENIL) 200 MG tablet Take 2 tablets (400 mg total) by mouth daily. 60 tablet 2   Lancet Device MISC 1 each by Does not apply route 3 (three) times daily.  May dispense any manufacturer covered by patient's insurance. 1 each 0   Lancets MISC 1 each by Does not apply route 3 (three) times daily. Use as directed to check blood sugar. May dispense any manufacturer covered by patient's insurance and fits patient's device. 100 each 0   medroxyPROGESTERone (DEPO-PROVERA) 150 MG/ML injection Inject 150 mg into the muscle every 3 (three) months.     MOUNJARO 7.5 MG/0.5ML Pen Inject into the skin.     naratriptan (AMERGE) 2.5 MG tablet 2.5 mg.     norethindrone (AYGESTIN) 5 MG tablet Take 10 mg by mouth every evening.     ondansetron (ZOFRAN-ODT) 8 MG disintegrating tablet Take 1 tablet (8 mg total) by mouth every 8 (eight) hours as needed for nausea or vomiting. 20 tablet 0   pregabalin (LYRICA) 50 MG capsule Take 1 capsule (50 mg total) by mouth 3 (three) times daily. 90 capsule 3   tinidazole (TINDAMAX) 500 MG tablet Take 2 tablets (1,000 mg total) by mouth daily with breakfast. 10 tablet 0   triamcinolone ointment (KENALOG) 0.1 % Apply topically daily as needed.     fluconazole (DIFLUCAN) 150 MG tablet Take 1 tablet (150 mg total) by mouth every 3 (three) days. (Patient not taking: Reported on 10/25/2023) 5 tablet 0   predniSONE (STERAPRED UNI-PAK 21 TAB) 10 MG (21) TBPK tablet Take by mouth daily. Take 6 tabs by mouth daily  for 2 days, then 5 tabs for 2 days, then 4 tabs for 2 days, then 3 tabs for 2 days, 2 tabs for 2 days, then 1 tab by mouth daily for 2 days (Patient not taking: Reported on 10/25/2023) 42 tablet 0  tirzepatide [facility]) 5 MG/0.5ML Pen Inject 5 mg into the skin once a week. (Patient not taking: Reported on 10/28/2023)     traZODone (DESYREL) 300 MG tablet Take 1 tablet (300 mg total) by mouth at bedtime. (Patient not taking: Reported on 10/28/2023) 30 tablet 0   No current facility-administered medications on file prior to visit.    Allergies  Allergen Reactions   Clindamycin/Lincomycin Other (See Comments), Hives, Palpitations,  Rash and Swelling    Other Reaction(s): Racing heart beat   Gabapentin Hives, Nausea And Vomiting and Palpitations    Other Reaction(s): Unknown   Other Anaphylaxis    Allergic to Rynatan cough syrup.   Chlorpheniramine-Phenylephrine Nausea And Vomiting    Patient states she is not allergic to this.    Pregabalin Rash    Allergic to the generic    Cephalexin Diarrhea and Hives    HST :  Total AHI was  33.3/h                          REM pAHI:   57.4/h                                            NREM pAHI:     14.7/h                         Positional AHI: The patient only slept in supine and due to orthopnea slightly elevated.  Twas minimal snoring recorded by chest wall vibration sensor.                                            Oxygen Saturation Statistics:          O2 Saturation Range (%):    Between a nadir of 86 and a maximum saturation of 98% with a mean saturation of 93%                                 O2 Saturation (minutes) <89%: 0.3 minutes         Pulse Rate Statistics:   Pulse Mean (bpm):   107  bpm       Pulse Range:    Between 75 and 127 beats per minute             IMPRESSION:  This HST confirms the presence of severe and apparently obstructive sleep apnea with a very strong REM sleep dependence.  REM sleep apnea exceeded normal REM sleep apnea 4 times.  There is tachycardia at baseline.  The device does not tell us about cardiac rhythm data.     RECOMMENDATION: REM sleep dependent apnea with or without hypoventilation will need to be treated with a positive airway pressure device either CPAP or BiPAP. I like for the patient to continue with CPAP therapy.  Her current device is set at 15 cm of water I will like to start an auto titration device between 7 and  20 cm of water pressure , 3 cm EPR, humidifier and interface of her choosing.  5-14-2024Melvyn Novas, MD      DIAGNOSTIC DATA (LABS, IMAGING, TESTING) - I reviewed  patient records, labs, notes,  testing and imaging myself where available.  Provided history: Concussion without loss of consciousness, subsequent encounter. Acute post-traumatic headache, not intractable. Insomnia, unspecified type. Additional history provided by the scanning technologist: headache, blurred vision, speech difficulty, head injury in August of 2024.  EXAM: MRI HEAD WITHOUT AND WITH CONTRAST  TECHNIQUE: Multiplanar, multiecho pulse sequences of the brain and surrounding structures were obtained without and with intravenous contrast.  CONTRAST: 10 mL Vueway intravenous contrast.  COMPARISON: Head CT 07/15/2023.  FINDINGS: Brain:  Cerebral volume is normal.  Partially empty sella turcica.  No cortical encephalomalacia is identified. No significant cerebral white matter disease.  There is no acute infarct.  No evidence of an intracranial mass.  No chronic intracranial blood products.  No extra-axial fluid collection.  No midline shift.  No pathologic intracranial enhancement identified.  Vascular: Maintained flow voids within the proximal large arterial vessels.  Skull and upper cervical spine: No focal worrisome marrow lesion.  Sinuses/Orbits: No mass or acute finding within the imaged orbits. No significant paranasal sinus disease.  IMPRESSION: 1. No evidence of an acute intracranial abnormality. 2. Partially empty sella turcica. This finding can reflect incidental anatomic variation, or alternatively, it can be associated with chronic idiopathic intracranial hypertension (pseudotumor cerebri). 3. Otherwise unremarkable MRI appearance of the brain.   10-04-2023  Lab Results  Component Value Date   WBC 10.4 08/05/2023   HGB 12.5 08/05/2023   HCT 40.7 08/05/2023   MCV 81.6 08/05/2023   PLT 312 08/05/2023      Component Value Date/Time   NA 136 08/05/2023 2231   NA 141 11/25/2022 1149   K 4.0 08/05/2023 2231   CL 103 08/05/2023 2231   CO2 26 08/05/2023 2231    GLUCOSE 164 (H) 08/05/2023 2231   BUN 9 08/05/2023 2231   BUN 7 11/25/2022 1149   CREATININE 0.76 08/05/2023 2231   CALCIUM 9.0 08/05/2023 2231   PROT 7.1 05/09/2023 0709   PROT 6.6 11/25/2022 1149   ALBUMIN 3.3 (L) 05/09/2023 0709   ALBUMIN 3.7 (L) 11/25/2022 1149   AST 11 (L) 05/09/2023 0709   ALT 16 05/09/2023 0709   ALKPHOS 92 05/09/2023 0709   BILITOT 0.4 05/09/2023 0709   BILITOT <0.2 11/25/2022 1149   GFRNONAA >60 08/05/2023 2231   Lab Results  Component Value Date   CHOL 166 11/30/2022   HDL 51 11/30/2022   LDLCALC 96 11/30/2022   TRIG 103 11/30/2022   CHOLHDL 3.3 11/30/2022   Lab Results  Component Value Date   HGBA1C 7.7 (H) 05/09/2023   Lab Results  Component Value Date   VITAMINB12 483 04/09/2023   Lab Results  Component Value Date   TSH 2.820 11/30/2022    PHYSICAL EXAM:  Today's Vitals   10/28/23 1448  BP: 115/78  Pulse: (!) 110  Weight: (!) 402 lb (182.3 kg)  Height: 5\' 4"  (1.626 m)   Body mass index is 69 kg/m.   Wt Readings from Last 3 Encounters:  10/28/23 (!) 402 lb (182.3 kg)  10/25/23 (!) 400 lb (181.4 kg)  10/20/23 (!) 398 lb (180.5 kg)     Ht Readings from Last 3 Encounters:  10/28/23 5\' 4"  (1.626 m)  10/25/23 5' 4.5" (1.638 m)  10/20/23 5\' 4"  (1.626 m)      General: The patient is awake, alert and appears not in acute distress. The patient is well groomed. Head: Normocephalic, atraumatic. Neck is supple.  Mallampati 3,  neck circumference:18 inches .  Nasal airflow  patent.  Retrognathia is not seen.  Dental status: wore braces. Cardiovascular:  Regular rate and cardiac rhythm by pulse,  without distended neck veins. Respiratory: Lungs are clear to auscultation.  Skin:  With evidence of ankle edema, dry rash in central face.  Trunk: obese   NEUROLOGIC EXAM: The patient is awake and alert, oriented to place and time.   Memory subjective described as intact.  Attention span & concentration ability appears normal.  Speech  is fluent, without  dysarthria, dysphonia or aphasia.  Mood and affect are appropriate.   Cranial nerves: no loss of smell or taste reported  Pupils are equal and briskly reactive to light. Funduscopic exam def..  Extraocular movements in vertical and horizontal planes were intact and without nystagmus. No Diplopia. Visual fields by finger perimetry are intact. Hearing was intact to soft voice and finger rubbing.    Facial sensation intact to fine touch.  Facial motor strength is symmetric and tongue and uvula move midline.  Neck ROM : rotation, tilt and flexion extension were normal for age and shoulder shrug was symmetrical.    Motor exam:  Symmetric bulk, tone and ROM.   Normal tone without cog wheeling, symmetric grip strength .   Gait and station: Patient could rise unassisted from a seated position, walked without assistive device.  Stance is of wider base .  Toe and heel walk were deferred.   Romberg - positive ,  swaying front to back. Reports feeling dizzy and seeing "all white " Deep tendon reflexes: in the  upper and lower extremities are symmetric and intact.     ASSESSMENT AND PLAN 27 y.o. year old female  here with:posttraumatic headaches with some migrainous symptoms.   MRI showed no injury marks. No lesion, only partially empty sella which is very frequently seen with BMI of 60 and above . Can be a sign of idiopathic intracranial hypertension.  The light / flash sensation can be a sign of retro-ocular pressure. She has more spells like this when she strains, or when picking up something heavy.      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 Likely a migraine that has new quality.     2) she has not made this appointment as a CPAP follow up, was instead sent today for a new problem which has arisen since her HST and work up for OSA were completed. She has no clinically  significant hypoxemia and I cannot state that her headaches correlated to apnea.   These can be IIH signs and symptoms.   Order for LP at [facility] , interventional radiology for opening pressure.  Placed immediatly today- patient later returned a call to RN and stated she needs sedation. Plans were changed to hospital MRI-   We are awaiting opening pressure results.   Follow up either personally or through our NP within 4 months.   I would like to thank Manuela Neptune, MD and Richardean Sale, Do [address] [place],  [place] [address] for allowing me to meet with and to take care of this pleasant patient.   CC: I will share my notes with PCP .  After spending a total time of  45  minutes face to face and additional time for physical and neurologic examination, review of laboratory studies,  personal review of imaging studies, reports and results of other testing and review  of referral information / records as far as provided in visit,   Electronically signed by: Melvyn Novas, MD 10/28/2023 3:10 PM  [facility] Board certified by The [employer] of Sleep Medicine and Diplomate of the [employer] of Sleep Medicine. Board certified In Neurology through the ABPN, Fellow of the [employer] of Neurology.

## 2023-10-29 ENCOUNTER — Encounter: Payer: Self-pay | Admitting: Neurology

## 2023-10-31 ENCOUNTER — Ambulatory Visit
Admission: EM | Admit: 2023-10-31 | Discharge: 2023-10-31 | Disposition: A | Discharge status: Home / Self Care | Payer: Medicaid Other | Attending: Family Medicine | Admitting: Family Medicine

## 2023-10-31 DIAGNOSIS — M5412 Radiculopathy, cervical region: Secondary | ICD-10-CM | POA: Diagnosis not present

## 2023-10-31 MED ORDER — CYCLOBENZAPRINE HCL 10 MG PO TABS: 10.0000 mg | ORAL_TABLET | Freq: Three times a day (TID) | ORAL | 1 refills | Status: DC | PRN

## 2023-10-31 MED ORDER — NAPROXEN 500 MG PO TABS: 500.0000 mg | ORAL_TABLET | Freq: Two times a day (BID) | ORAL | 0 refills | Status: DC

## 2023-10-31 NOTE — ED Triage Notes (Signed)
Pt reports back pain and shoulder pain x 2-3 days. Cyclobenzaprine helped her when she took it the 1st day, pt just had 4 tablets.

## 2023-10-31 NOTE — ED Provider Notes (Signed)
[name] URGENT [facility]  Note:  This document was prepared using Conservation officer, historic buildings and may include unintentional dictation errors.  MRN: [id number] DOB: 05-Mar-1996  Subjective:   Jade Lopez is a 27 y.o. female presenting for 3-day history of acute onset persistent moderate to severe upper back pain, lower neck pain that radiates to the arms and shoulders.  No direct trauma to the area.  Patient has been working through chronic posttraumatic headaches, empty sella turcica.  She has tried meloxicam, gabapentin, Lyrica all of which she cannot tolerate.  She has tolerated naproxen well.  Would like a refill of her Flexeril.  Patient is also diabetic, takes insulin for this.  Of note, she also just finished a prednisone course.  No current facility-administered medications for this encounter.  Current Outpatient Medications:    Accu-Chek Softclix Lancets lancets, daily., Disp: , Rfl:    albuterol (VENTOLIN HFA) 108 (90 Base) MCG/ACT inhaler, Inhale 1-2 puffs into the lungs every 6 (six) hours as needed for wheezing or shortness of breath., Disp: 1 each, Rfl: 1   ASHWAGANDHA PO, Take by mouth., Disp: , Rfl:    BIOTIN PO, Take by mouth., Disp: , Rfl:    Blood Glucose Monitoring Suppl (MM EASY TOUCH GLUCOSE METER) w/Device KIT, Please check your blood sugar daily, in the morning., Disp: 1 kit, Rfl: 0   Blood Glucose Monitoring Suppl DEVI, 1 each by Does not apply route 3 (three) times daily. May dispense any manufacturer covered by patient's insurance., Disp: 1 each, Rfl: 0   budesonide-formoterol (SYMBICORT) 80-4.5 MCG/ACT inhaler, Inhale 2 puffs into the lungs 2 (two) times daily as needed., Disp: 1 each, Rfl: 12   busPIRone (BUSPAR) 10 MG tablet, Take 3 tablets (30 mg total) by mouth daily., Disp: 30 tablet, Rfl: 5   busPIRone (BUSPAR) 15 MG tablet, Take 15 mg by mouth 2 (two) times daily., Disp: , Rfl:    cyclobenzaprine (FLEXERIL) 10 MG tablet, Take 1 tablet (10  mg total) by mouth 2 (two) times daily as needed for muscle spasms., Disp: 20 tablet, Rfl: 1   Glucose Blood (BLOOD GLUCOSE TEST STRIPS) STRP, 1 each by Does not apply route 3 (three) times daily. Use as directed to check blood sugar. May dispense any manufacturer covered by patient's insurance and fits patient's device., Disp: 100 strip, Rfl: 0   hydroxychloroquine (PLAQUENIL) 200 MG tablet, Take 2 tablets (400 mg total) by mouth daily., Disp: 60 tablet, Rfl: 2   Lancet Device MISC, 1 each by Does not apply route 3 (three) times daily. May dispense any manufacturer covered by patient's insurance., Disp: 1 each, Rfl: 0   Lancets MISC, 1 each by Does not apply route 3 (three) times daily. Use as directed to check blood sugar. May dispense any manufacturer covered by patient's insurance and fits patient's device., Disp: 100 each, Rfl: 0   medroxyPROGESTERone (DEPO-PROVERA) 150 MG/ML injection, Inject 150 mg into the muscle every 3 (three) months., Disp: , Rfl:    MOUNJARO 7.5 MG/0.5ML Pen, Inject into the skin., Disp: , Rfl:    naratriptan (AMERGE) 2.5 MG tablet, 2.5 mg., Disp: , Rfl:    norethindrone (AYGESTIN) 5 MG tablet, Take 10 mg by mouth every evening., Disp: , Rfl:    ondansetron (ZOFRAN-ODT) 8 MG disintegrating tablet, Take 1 tablet (8 mg total) by mouth every 8 (eight) hours as needed for nausea or vomiting., Disp: 20 tablet, Rfl: 0   predniSONE (STERAPRED UNI-PAK 21 TAB) 10 MG (  21) TBPK tablet, Take by mouth daily. Take 6 tabs by mouth daily  for 2 days, then 5 tabs for 2 days, then 4 tabs for 2 days, then 3 tabs for 2 days, 2 tabs for 2 days, then 1 tab by mouth daily for 2 days (Patient not taking: Reported on 10/25/2023), Disp: 42 tablet, Rfl: 0   pregabalin (LYRICA) 50 MG capsule, Take 1 capsule (50 mg total) by mouth 3 (three) times daily., Disp: 90 capsule, Rfl: 3   tinidazole (TINDAMAX) 500 MG tablet, Take 2 tablets (1,000 mg total) by mouth daily with breakfast., Disp: 10 tablet, Rfl:  0   tirzepatide (MOUNJARO) 5 MG/0.5ML Pen, Inject 5 mg into the skin once a week. (Patient not taking: Reported on 10/28/2023), Disp: , Rfl:    traZODone (DESYREL) 300 MG tablet, Take 1 tablet (300 mg total) by mouth at bedtime. (Patient not taking: Reported on 10/25/2023), Disp: 30 tablet, Rfl: 0   triamcinolone ointment (KENALOG) 0.1 %, Apply topically daily as needed., Disp: , Rfl:    Allergies  Allergen Reactions   Clindamycin/Lincomycin Other (See Comments), Hives, Palpitations, Rash and Swelling    Other Reaction(s): Racing heart beat   Gabapentin Hives, Nausea And Vomiting, Palpitations, Rash and Shortness Of Breath    Other Reaction(s): Unknown   Other Anaphylaxis    Allergic to Rynatan cough syrup.   Pregabalin Rash, Hives and Shortness Of Breath    Allergic to the generic  Patient tolerates BRAND Lyrica only -- cannot take Pregabalin   Chlorpheniramine-Phenylephrine Nausea And Vomiting    Patient states she is not allergic to this.    Cephalexin Diarrhea and Hives   Clindamycin Rash    Past Medical History:  Diagnosis Date   Asthma    Autoimmune disease (HCC)    Blood clots in brain 2020   Class 3 obesity    Endometriosis    Endometritis    Fibromyalgia    Near syncope 05/09/2023   Sleep apnea    Sleep related headaches 02/24/2023   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   T2DM (type 2 diabetes mellitus) (HCC)    TOA (tubo-ovarian abscess)      Past Surgical History:  Procedure Laterality Date   BREAST LUMPECTOMY Right 2024   CHOLECYSTECTOMY     DILATATION AND CURETTAGE/HYSTEROSCOPY WITH MINERVA     3 total   TONSILLECTOMY     TONSILLECTOMY AND ADENOIDECTOMY      Family History  Problem Relation Age of Onset   Diabetes Mother    Hypertension Mother    Migraines Mother    Diabetes Father    Hypertension Father    Sleep apnea Father     Social History   Tobacco Use   Smoking status: Never    Passive exposure: Never   Smokeless tobacco: Never  Vaping Use    Vaping status: Never Used  Substance Use Topics   Alcohol use: Not Currently    Comment: occ   Drug use: No    ROS   Objective:   Vitals: BP 138/88 (BP Location: Right Arm)   Pulse 100   Temp 98.4 F (36.9 C) (Oral)   Resp (!) 22   SpO2 96%   Physical Exam Constitutional:      General: She is not in acute distress.    Appearance: Normal appearance. She is well-developed. She is not ill-appearing, toxic-appearing or diaphoretic.  HENT:     Head: Normocephalic and atraumatic.     Right Ear: External ear  normal.     Left Ear: External ear normal.     Nose: Nose normal.     Mouth/Throat:     Mouth: Mucous membranes are moist.  Eyes:     General: No scleral icterus.       Right eye: No discharge.        Left eye: No discharge.     Extraocular Movements: Extraocular movements intact.  Cardiovascular:     Rate and Rhythm: Normal rate.  Pulmonary:     Effort: Pulmonary effort is normal.  Musculoskeletal:     Cervical back: Neck supple. Spasms and tenderness (Positive Spurling maneuver to the right) present. No swelling, edema, deformity, erythema, signs of trauma, lacerations, rigidity, torticollis, bony tenderness or crepitus. Pain with movement present. No spinous process tenderness or muscular tenderness. Decreased range of motion.  Skin:    General: Skin is warm and dry.  Neurological:     General: No focal deficit present.     Mental Status: She is alert and oriented to person, place, and time.     Cranial Nerves: No cranial nerve deficit.     Motor: No weakness.     Coordination: Coordination normal.     Gait: Gait normal.     Deep Tendon Reflexes: Reflexes normal.  Psychiatric:        Mood and Affect: Mood normal.        Behavior: Behavior normal.     Assessment and Plan :   PDMP not reviewed this encounter.  1. Cervical radiculopathy    Unfortunately have to avoid steroid use given her diabetes and recent steroids prescribed.  High suspicion for  cervical radiculopathy, recommend follow-up with the spine clinic.  Warrants consideration for an MRI.  For now use naproxen, Flexeril.  Can also follow-up with her current orthopedist.  Counseled patient on potential for adverse effects with medications prescribed/recommended today, ER and return-to-clinic precautions discussed, patient verbalized understanding.    Wallis Bamberg, [place] 10/31/23 1610

## 2023-11-01 ENCOUNTER — Telehealth: Payer: Self-pay | Admitting: Neurology

## 2023-11-01 ENCOUNTER — Encounter (HOSPITAL_COMMUNITY): Payer: Self-pay | Admitting: Neurology

## 2023-11-01 NOTE — Telephone Encounter (Signed)
I sent the lumbar puncture order to Karle Barr in IR scheduling at [facility] since it is to be with sedation. She said that the doctors have to review the order then they will tell her to schedule it and she will call the patient. Her phone number is [phone]

## 2023-11-04 ENCOUNTER — Encounter: Payer: Self-pay | Admitting: Internal Medicine

## 2023-11-07 NOTE — Telephone Encounter (Signed)
Recommending her to pause use of hydroxychloroquine now in associated result note.

## 2023-11-08 ENCOUNTER — Telehealth: Payer: Self-pay | Admitting: Neurology

## 2023-11-08 ENCOUNTER — Telehealth: Payer: Self-pay

## 2023-11-08 MED ORDER — TOPIRAMATE 25 MG PO TABS: 25.0000 mg | ORAL_TABLET | Freq: Two times a day (BID) | ORAL | 3 refills | Status: AC

## 2023-11-08 NOTE — Telephone Encounter (Signed)
Pt said headache started yesterday afternoon, took 2 tabletTylenol  500mg . Got up this morning and migraines was worser than yesterday.Soak feet in hot water, peppermint oil on scalp, took more Tylenol, took naratriptan (AMERGE) 2.5 MG tablet (already took the 2 tablets for today) still not working. Would like a call from the nurse to know what to do.

## 2023-11-08 NOTE — Telephone Encounter (Signed)
 Patient advised in result note.

## 2023-11-08 NOTE — Addendum Note (Signed)
Addended by: Judi Cong on: 11/08/2023 05:32 PM   Modules accepted: Orders

## 2023-11-08 NOTE — Telephone Encounter (Signed)
-----   Message from Jamesetta Orleans [facility] sent at 11/07/2023  2:58 PM EST ----- Sed rate of 67 is unchanged. CRP of 40.4 is slightly down from 51.7. Complement Ce of 222 is slightly down from 238. Overall these are pretty minimal changes, I recommend stopping the hydroxychloroquine for now and monitoring symptoms. If joint or skin problems flare up within a few weeks of stopping we could resume it, or discuss alternative treatments.

## 2023-11-09 ENCOUNTER — Encounter (HOSPITAL_COMMUNITY): Payer: Self-pay

## 2023-11-11 ENCOUNTER — Encounter (HOSPITAL_COMMUNITY): Payer: Self-pay | Admitting: Interventional Radiology

## 2023-11-11 NOTE — Progress Notes (Signed)
 SDW call  Patient was given pre-op instructions over the phone. Patient verbalized understanding of instructions provided.     PCP -  denies Neurologist: Dr.Carmen Dohmeier Cardiologist -  Pulmonary:    PPM/ICD - denies Device Orders - na Rep Notified - na   Chest x-ray - 05/07/2023 EKG -  05/07/2023 Stress Test -05/08/2023 ECHO -  Cardiac Cath -   Sleep Study/sleep apnea/CPAP: OSA, wears CPAP nightly  Type II diabetic Fasting Blood sugar range: 70-140 How often check sugars: daily  GLP1: Mounjaro , states last dose 10/27/2023   Blood Thinner Instructions: denies Aspirin Instructions:denies   ERAS Protcol - Clears until 1100   COVID TEST- na    Anesthesia review: Yes.  DM, OSA with CPAP morbid obesity with BMI 69   Patient denies shortness of breath, fever, cough and chest pain over the phone call  Your procedure is scheduled on Monday November 15, 2023  Report to [facility] Main Entrance A at  1130  A.M., then check in with the Admitting office.  Call this number if you have problems the morning of surgery:  [phone]   If you have any questions prior to your surgery date call [phone]: Open Monday-Friday 8am-4pm If you experience any cold or flu symptoms such as cough, fever, chills, shortness of breath, etc. between now and your scheduled surgery, please notify us  at the above number     Remember:  Do not eat after midnight the night before your surgery  You may drink clear liquids until  1100   the morning of your surgery.   Clear liquids allowed are: Water, Non-Citrus Juices (without pulp), Carbonated Beverages, Clear Tea, Black Coffee ONLY (NO MILK, CREAM OR POWDERED CREAMER of any kind), and Gatorade   Take these medicines the morning of surgery with A SIP OF WATER:  Tylenol , symbicort , flexeril , topamax   As needed: Albuterol , zofran   As of today, STOP taking any Aspirin (unless otherwise instructed by your surgeon) Aleve , Naproxen , Ibuprofen ,  Motrin , Advil , Goody's, BC's, all herbal medications, fish oil, and all vitamins.

## 2023-11-12 ENCOUNTER — Encounter (HOSPITAL_COMMUNITY): Payer: Self-pay | Admitting: Interventional Radiology

## 2023-11-12 ENCOUNTER — Other Ambulatory Visit: Payer: Self-pay | Admitting: Student

## 2023-11-12 NOTE — Anesthesia Preprocedure Evaluation (Addendum)
 Anesthesia Evaluation  Patient identified by MRN, date of birth, ID band Patient awake    Reviewed: Allergy & Precautions, NPO status , Patient's Chart, lab work & pertinent test results, reviewed documented beta blocker date and time   History of Anesthesia Complications Negative for: history of anesthetic complications  Airway Mallampati: I  TM Distance: >3 FB Neck ROM: Full    Dental no notable dental hx.    Pulmonary asthma , sleep apnea and Continuous Positive Airway Pressure Ventilation , neg COPD   breath sounds clear to auscultation       Cardiovascular (-) hypertension+ Orthopnea  (-) CAD, (-) Past MI and (-) Cardiac Stents  Rhythm:Regular Rate:Tachycardia     Neuro/Psych  Headaches, neg Seizures  Anxiety      Neuromuscular disease    GI/Hepatic ,,,(+) neg Cirrhosis        Endo/Other  diabetes, Type 2  Class 4 obesity  Renal/GU      Musculoskeletal  (+)  Fibromyalgia -  Abdominal   Peds  Hematology   Anesthesia Other Findings   Reproductive/Obstetrics                             Anesthesia Physical Anesthesia Plan  ASA: 3  Anesthesia Plan: General   Post-op Pain Management:    Induction: Intravenous  PONV Risk Score and Plan: 2 and Ondansetron  and Dexamethasone   Airway Management Planned: Oral ETT  Additional Equipment:   Intra-op Plan:   Post-operative Plan:   Informed Consent: I have reviewed the patients History and Physical, chart, labs and discussed the procedure including the risks, benefits and alternatives for the proposed anesthesia with the patient or authorized representative who has indicated his/her understanding and acceptance.     Dental advisory given  Plan Discussed with: CRNA  Anesthesia Plan Comments: (PAT note written 11/12/2023 by Allison Zelenak, PA-C.  )       Anesthesia Quick Evaluation

## 2023-11-12 NOTE — Progress Notes (Signed)
 Anesthesia Chart Review: Jade Lopez  Case: [id number] Date/Time: 11/15/23 1345   Procedure: Lumbar Puncture   Anesthesia type: General   Pre-op diagnosis: Empty sella turcica - E23.6   Location: [facility] RADIOLOGY RO[facility] / [facility] OR   Surgeons: Karalee Wilkie POUR, MD       DISCUSSION: Patient is a 28 year old female scheduled for the above procedure.  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 Hospitalized 05/07/23-05/10/23 for syncope in setting of dehydration due to hyperglycemia and URI--unremarkable echo and carotid ultrasound.   She is followed by neurologist Dr. Chalice for OSA and post-traumatic headaches. By notes, she hit her head at work on 07/05/23 and had been having worsening headaches with migrainous features. Brain MRI on 10/04/23 showed no acute intracranial abnormality, partially empty sella turcica, which can reflect incidental anatomic variation or can be associated with chronic idiopathic intracranial hypertension (pseudotumor cerebri). Dr. Chalice noted, partially empty sella which is very frequently seen with BMI of 60 and above . Can be a sign of idiopathic intracranial hypertension.  The light / flash sensation can be a sign of retro-ocular pressure. She has more spells like this when she strains, or when picking up something heavy. She ordered an LP at [facility], but moved to OR with anesthesia due to need for sedation and BMI > 60. She also has severe OSA with CPAP use.    Anesthesia team to evaluate on the day of surgery.  Last Mounjaro  reported as  10/27/2023.    VS: Ht 5' 4 (1.626 m)   Wt (!) 180.5 kg   LMP  (LMP Unknown)   BMI 68.32 kg/m  Wt Readings from Last 3 Encounters:  10/28/23 (!) 182.3 kg  10/25/23 (!) 181.4 kg  10/20/23 (!) 180.5 kg   BP Readings from Last 3 Encounters:  10/31/23 138/88  10/28/23 115/78  10/25/23 126/79   Pulse Readings from Last 3 Encounters:  10/31/23 100  10/28/23 (!) 110  10/25/23 (!) 102    PROVIDERS: PCP is through [facility] Dohmeier, Dedra, MD is neurologist Jeannetta Bruckner, MD is rheumatologist   LABS: For day of procedure as indicated. Last results noted in [facility] and Care Everywhere include: A1c 7.5%, sodium 139, potassium 3.9, glucose 211, BUN 8, creatinine 0.78, AST 10, ALT 14, albumin 3.6 on 09/30/2023.  WBC 10.4, hemoglobin 12.5, hematocrit 40.7, platelet count 312 on 08/05/2023.   Home Sleep Study 03/23/23: IMPRESSION: This HST confirms the presence of severe and apparently obstructive sleep apnea with a very strong REM sleep dependence.  REM sleep apnea exceeded normal REM sleep apnea 4 times.  There is tachycardia at baseline.  The device does not tell us  about cardiac rhythm data. RECOMMENDATION: REM sleep dependent apnea with or without hypoventilation will need to be treated with a positive airway pressure device either CPAP or BiPAP...start an auto titration device between 7 and  20 cm of water pressure , 3 cm EPR, humidifier and interface of her choosing.   IMAGES: MRI Brain 10/04/23: IMPRESSION: 1. No evidence of an acute intracranial abnormality. 2. Partially empty sella turcica. This finding can reflect incidental anatomic variation, or alternatively, it can be associated with chronic idiopathic intracranial hypertension (pseudotumor  cerebri). 3. Otherwise unremarkable MRI appearance of the brain.  CT Abd/pelvis 05/07/23: IMPRESSION: 1. No acute intra-abdominal or intrapelvic process. 2. Stable nonspecific subcentimeter  central mesenteric lymph nodes. No pathologic adenopathy.   EKG: 05/07/23: Sinus tachycardia at 101 bpm  No significant change since last tracing Confirmed by Dreama Longs ([id number]) on 05/07/2023 5:59:42 PM   CV: Echo 05/08/23: IMPRESSIONS   1. Left ventricular ejection fraction, by estimation, is 60 to 65%. The  left ventricle has normal function. The left ventricle has no regional  wall motion abnormalities. Left ventricular diastolic parameters were  normal.   2. Right ventricular systolic function is normal. The right ventricular  size is normal. Tricuspid regurgitation signal is inadequate for assessing  PA pressure.   3. The mitral valve is grossly normal. Trivial mitral valve  regurgitation. No evidence of mitral stenosis.   4. The aortic valve is tricuspid. Aortic valve regurgitation is not  visualized. No aortic stenosis is present.   5. The inferior vena cava is normal in size with greater than 50%  respiratory variability, suggesting right atrial pressure of 3 mmHg.  - Conclusion(s)/Recommendation(s): Normal biventricular function without  evidence of hemodynamically significant valvular heart disease.   US  Carotid 05/08/23: Summary:  - Right Carotid: Velocities in the right ICA are consistent with a 1-39%  stenosis.  - Left Carotid: Velocities in the left ICA are consistent with a 1-39%  stenosis.  - Vertebrals: Bilateral vertebral arteries demonstrate antegrade flow.    Past Medical History:  Diagnosis Date   Asthma    Autoimmune disease (HCC)    Blood clots in brain 2020   Class 3 obesity    Endometriosis    Endometritis    Fibromyalgia    Near syncope 05/09/2023   Sleep apnea    Sleep related headaches 02/24/2023   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   T2DM (type 2 diabetes mellitus) (HCC)    TOA (tubo-ovarian abscess)     Past Surgical History:  Procedure Laterality Date   BREAST LUMPECTOMY Right 2024   CHOLECYSTECTOMY     DILATATION AND  CURETTAGE/HYSTEROSCOPY WITH MINERVA     3 total   INCISION AND DRAINAGE ABSCESS  2021   buttocks   TONSILLECTOMY AND ADENOIDECTOMY      MEDICATIONS: No current facility-administered medications for this encounter.    acetaminophen  (TYLENOL ) 500 MG tablet   albuterol  (VENTOLIN  HFA) 108 (90 Base) MCG/ACT inhaler   BIOTIN PO   budesonide -formoterol  (SYMBICORT ) 80-4.5 MCG/ACT inhaler   cyclobenzaprine  (FLEXERIL ) 10 MG tablet   medroxyPROGESTERone  (DEPO-PROVERA ) 150 MG/ML injection   MOUNJARO  7.5 MG/0.5ML Pen   Multiple Vitamins-Minerals (ZINC PO)   naproxen  (NAPROSYN ) 500 MG tablet   norethindrone  (AYGESTIN ) 5 MG tablet   ondansetron  (ZOFRAN -ODT) 8 MG disintegrating tablet   topiramate  (TOPAMAX ) 25 MG tablet   traZODone  (DESYREL ) 300 MG tablet   triamcinolone ointment (KENALOG) 0.1 %   Turmeric, Cur[place]uma [place], (TURMERIC ROOT) POWD   Accu-Chek Softclix Lancets lancets   Blood Glucose Monitoring Suppl (MM EASY TOUCH GLUCOSE METER) w/Device KIT   Blood Glucose Monitoring Suppl DEVI   busPIRone  (BUSPAR ) 10 MG tablet   Glucose Blood (BLOOD GLUCOSE TEST STRIPS) STRP   Lancet Device MISC   Lancets MISC   pregabalin  (LYRICA ) 50 MG capsule   tinidazole  (TINDAMAX ) 500 MG tablet    Isaiah Ruder, PA-C Surgical Short Stay/Anesthesiology [facility] Phone [phone] [facility] Phone [phone] 11/12/2023 12:26 PM

## 2023-11-15 ENCOUNTER — Ambulatory Visit (HOSPITAL_COMMUNITY)
Admission: RE | Admit: 2023-11-15 | Discharge: 2023-11-15 | Disposition: A | Discharge status: Home / Self Care | Payer: Medicaid Other | Source: Ambulatory Visit | Attending: Neurology | Admitting: Neurology

## 2023-11-15 ENCOUNTER — Ambulatory Visit (HOSPITAL_COMMUNITY)
Admission: RE | Admit: 2023-11-15 | Discharge: 2023-11-15 | Disposition: A | Discharge status: Home / Self Care | Payer: Medicaid Other | Attending: Interventional Radiology | Admitting: Interventional Radiology

## 2023-11-15 ENCOUNTER — Encounter (HOSPITAL_COMMUNITY): Payer: Self-pay | Admitting: Interventional Radiology

## 2023-11-15 ENCOUNTER — Encounter (HOSPITAL_COMMUNITY): Payer: Self-pay

## 2023-11-15 ENCOUNTER — Encounter (HOSPITAL_COMMUNITY)
Admission: RE | Disposition: A | Discharge status: Home / Self Care | Payer: Self-pay | Source: Home / Self Care | Attending: Interventional Radiology

## 2023-11-15 ENCOUNTER — Other Ambulatory Visit: Payer: Self-pay

## 2023-11-15 ENCOUNTER — Ambulatory Visit (HOSPITAL_COMMUNITY): Payer: Self-pay | Admitting: Vascular Surgery

## 2023-11-15 ENCOUNTER — Ambulatory Visit (HOSPITAL_BASED_OUTPATIENT_CLINIC_OR_DEPARTMENT_OTHER): Payer: Medicaid Other | Admitting: Vascular Surgery

## 2023-11-15 ENCOUNTER — Other Ambulatory Visit: Payer: Self-pay | Admitting: Neurology

## 2023-11-15 DIAGNOSIS — J45909 Unspecified asthma, uncomplicated: Secondary | ICD-10-CM | POA: Insufficient documentation

## 2023-11-15 DIAGNOSIS — Z7985 Long-term (current) use of injectable non-insulin antidiabetic drugs: Secondary | ICD-10-CM | POA: Insufficient documentation

## 2023-11-15 DIAGNOSIS — Z833 Family history of diabetes mellitus: Secondary | ICD-10-CM | POA: Insufficient documentation

## 2023-11-15 DIAGNOSIS — E669 Obesity, unspecified: Secondary | ICD-10-CM | POA: Insufficient documentation

## 2023-11-15 DIAGNOSIS — E236 Other disorders of pituitary gland: Secondary | ICD-10-CM | POA: Insufficient documentation

## 2023-11-15 DIAGNOSIS — G4733 Obstructive sleep apnea (adult) (pediatric): Secondary | ICD-10-CM | POA: Diagnosis not present

## 2023-11-15 DIAGNOSIS — E119 Type 2 diabetes mellitus without complications: Secondary | ICD-10-CM | POA: Diagnosis not present

## 2023-11-15 DIAGNOSIS — R519 Headache, unspecified: Secondary | ICD-10-CM | POA: Insufficient documentation

## 2023-11-15 DIAGNOSIS — Z6841 Body Mass Index (BMI) 40.0 and over, adult: Secondary | ICD-10-CM | POA: Insufficient documentation

## 2023-11-15 DIAGNOSIS — M797 Fibromyalgia: Secondary | ICD-10-CM | POA: Diagnosis not present

## 2023-11-15 HISTORY — PX: IR LUMBAR PUNCTURE: IMG944

## 2023-11-15 HISTORY — PX: RADIOLOGY WITH ANESTHESIA: SHX6223

## 2023-11-15 LAB — CBC
HCT: 50.4 % — ABNORMAL HIGH (ref 36.0–46.0)
Hemoglobin: 15.6 g/dL — ABNORMAL HIGH (ref 12.0–15.0)
MCH: 26.4 pg (ref 26.0–34.0)
MCHC: 31 g/dL (ref 30.0–36.0)
MCV: 85.4 fL (ref 80.0–100.0)
Platelets: 254 10*3/uL (ref 150–400)
RBC: 5.9 MIL/uL — ABNORMAL HIGH (ref 3.87–5.11)
RDW: 14.4 % (ref 11.5–15.5)
WBC: 9 10*3/uL (ref 4.0–10.5)
nRBC: 0 % (ref 0.0–0.2)

## 2023-11-15 LAB — BASIC METABOLIC PANEL
Anion gap: 11 (ref 5–15)
BUN: 7 mg/dL (ref 6–20)
CO2: 22 mmol/L (ref 22–32)
Calcium: 9.5 mg/dL (ref 8.9–10.3)
Chloride: 104 mmol/L (ref 98–111)
Creatinine, Ser: 0.78 mg/dL (ref 0.44–1.00)
GFR, Estimated: >60 mL/min (ref >60)
Glucose, Bld: 116 mg/dL — ABNORMAL HIGH (ref 70–99)
Potassium: 3.8 mmol/L (ref 3.5–5.1)
Sodium: 137 mmol/L (ref 135–145)

## 2023-11-15 LAB — CSF CELL COUNT WITH DIFFERENTIAL
RBC Count, CSF: 135 /mm3 — ABNORMAL HIGH
Tube #: 3
WBC, CSF: 1 /mm3 (ref 0–5)

## 2023-11-15 LAB — PROTEIN, CSF: Total  Protein, CSF: 12 mg/dL — ABNORMAL LOW (ref 15–45)

## 2023-11-15 LAB — GLUCOSE, CAPILLARY
Glucose-Capillary: 101 mg/dL — ABNORMAL HIGH (ref 70–99)
Glucose-Capillary: 91 mg/dL (ref 70–99)

## 2023-11-15 LAB — POCT PREGNANCY, URINE: Preg Test, Ur: NEGATIVE

## 2023-11-15 SURGERY — IR WITH ANESTHESIA
Anesthesia: General

## 2023-11-15 MED ORDER — ACETAMINOPHEN 325 MG PO TABS
325.0000 mg | ORAL_TABLET | ORAL | Status: DC | PRN
Start: 2023-11-15 — End: 2023-11-15

## 2023-11-15 MED ORDER — ACETAMINOPHEN 160 MG/5ML PO SOLN
325.0000 mg | ORAL | Status: DC | PRN
Start: 2023-11-15 — End: 2023-11-15

## 2023-11-15 MED ORDER — SUGAMMADEX SODIUM 200 MG/2ML IV SOLN
INTRAVENOUS | Status: DC | PRN
  Administered 2023-11-15: 400 mg via INTRAVENOUS

## 2023-11-15 MED ORDER — ACETAZOLAMIDE 250 MG PO TABS: 250.0000 mg | ORAL_TABLET | Freq: Two times a day (BID) | ORAL | 5 refills | Status: DC

## 2023-11-15 MED ORDER — FENTANYL CITRATE (PF) 100 MCG/2ML IJ SOLN
INTRAMUSCULAR | Status: AC
  Filled 2023-11-15: qty 2

## 2023-11-15 MED ORDER — ACETAMINOPHEN 10 MG/ML IV SOLN
INTRAVENOUS | Status: AC
  Filled 2023-11-15: qty 100

## 2023-11-15 MED ORDER — MIDAZOLAM HCL 2 MG/2ML IJ SOLN
INTRAMUSCULAR | Status: AC
  Filled 2023-11-15: qty 2

## 2023-11-15 MED ORDER — FENTANYL CITRATE (PF) 100 MCG/2ML IJ SOLN
25.0000 ug | INTRAMUSCULAR | Status: DC | PRN
  Administered 2023-11-15: 50 ug via INTRAVENOUS
  Administered 2023-11-15: 25 ug via INTRAVENOUS

## 2023-11-15 MED ORDER — ROCURONIUM BROMIDE 10 MG/ML (PF) SYRINGE
PREFILLED_SYRINGE | INTRAVENOUS | Status: DC | PRN
  Administered 2023-11-15: 50 mg via INTRAVENOUS

## 2023-11-15 MED ORDER — ONDANSETRON HCL 4 MG/2ML IJ SOLN
INTRAMUSCULAR | Status: DC | PRN
  Administered 2023-11-15: 4 mg via INTRAVENOUS

## 2023-11-15 MED ORDER — PROPOFOL 10 MG/ML IV BOLUS
INTRAVENOUS | Status: DC | PRN
  Administered 2023-11-15: 150 mg via INTRAVENOUS
  Administered 2023-11-15: 50 mg via INTRAVENOUS

## 2023-11-15 MED ORDER — ACETAMINOPHEN 10 MG/ML IV SOLN: 1000.0000 mg | Freq: Once | INTRAVENOUS | Status: DC | PRN

## 2023-11-15 MED ORDER — LIDOCAINE HCL 1 % IJ SOLN
INTRAMUSCULAR | Status: AC
  Filled 2023-11-15: qty 20

## 2023-11-15 MED ORDER — DROPERIDOL 2.5 MG/ML IJ SOLN: 0.6250 mg | Freq: Once | INTRAMUSCULAR | Status: DC | PRN

## 2023-11-15 MED ORDER — OXYCODONE HCL 5 MG PO TABS
ORAL_TABLET | ORAL | Status: AC
  Filled 2023-11-15: qty 1

## 2023-11-15 MED ORDER — CHLORHEXIDINE GLUCONATE 0.12 % MT SOLN
15.0000 mL | Freq: Once | OROMUCOSAL | Status: DC
  Filled 2023-11-15: qty 15

## 2023-11-15 MED ORDER — LIDOCAINE 2% (20 MG/ML) 5 ML SYRINGE
INTRAMUSCULAR | Status: DC | PRN
  Administered 2023-11-15: 60 mg via INTRAVENOUS

## 2023-11-15 MED ORDER — ACETAMINOPHEN 10 MG/ML IV SOLN
1000.0000 mg | Freq: Once | INTRAVENOUS | Status: AC
  Administered 2023-11-15: 1000 mg via INTRAVENOUS

## 2023-11-15 MED ORDER — SUCCINYLCHOLINE CHLORIDE 200 MG/10ML IV SOSY
PREFILLED_SYRINGE | INTRAVENOUS | Status: DC | PRN
  Administered 2023-11-15: 160 mg via INTRAVENOUS

## 2023-11-15 MED ORDER — MIDAZOLAM HCL 2 MG/2ML IJ SOLN
INTRAMUSCULAR | Status: DC | PRN
  Administered 2023-11-15: 2 mg via INTRAVENOUS

## 2023-11-15 MED ORDER — DEXAMETHASONE SODIUM PHOSPHATE 10 MG/ML IJ SOLN
INTRAMUSCULAR | Status: DC | PRN
  Administered 2023-11-15: 5 mg via INTRAVENOUS

## 2023-11-15 MED ORDER — ORAL CARE MOUTH RINSE: 15.0000 mL | Freq: Once | OROMUCOSAL | Status: DC

## 2023-11-15 MED ORDER — SODIUM CHLORIDE 0.9 % IV SOLN: INTRAVENOUS | Status: DC | PRN

## 2023-11-15 MED ORDER — FENTANYL CITRATE (PF) 250 MCG/5ML IJ SOLN
INTRAMUSCULAR | Status: DC | PRN
  Administered 2023-11-15 (×2): 50 ug via INTRAVENOUS

## 2023-11-15 MED ORDER — OXYCODONE HCL 5 MG/5ML PO SOLN: 5.0000 mg | Freq: Once | ORAL | Status: AC | PRN

## 2023-11-15 MED ORDER — LIDOCAINE HCL (PF) 1 % IJ SOLN
INTRAMUSCULAR | Status: AC
  Filled 2023-11-15: qty 30

## 2023-11-15 MED ORDER — ACETAMINOPHEN 500 MG PO TABS: 1000.0000 mg | ORAL_TABLET | Freq: Four times a day (QID) | ORAL | Status: DC | PRN

## 2023-11-15 MED ORDER — LACTATED RINGERS IV SOLN: INTRAVENOUS | Status: DC

## 2023-11-15 MED ORDER — OXYCODONE HCL 5 MG PO TABS
5.0000 mg | ORAL_TABLET | Freq: Once | ORAL | Status: AC | PRN
  Administered 2023-11-15: 5 mg via ORAL

## 2023-11-15 MED ORDER — CHLORHEXIDINE GLUCONATE 0.12 % MT SOLN
OROMUCOSAL | Status: AC
  Filled 2023-11-15: qty 15

## 2023-11-15 NOTE — Anesthesia Postprocedure Evaluation (Signed)
 Anesthesia Post Note  Patient: Jade Lopez  Procedure(s) Performed: Lumbar Puncture     Patient location during evaluation: PACU Anesthesia Type: General Level of consciousness: awake and alert Pain management: pain level controlled Vital Signs Assessment: post-procedure vital signs reviewed and stable Respiratory status: spontaneous breathing, nonlabored ventilation, respiratory function stable and patient connected to nasal cannula oxygen Cardiovascular status: blood pressure returned to baseline and stable Postop Assessment: no apparent nausea or vomiting Anesthetic complications: no  No notable events documented.  Last Vitals:  Vitals:   11/15/23 1600 11/15/23 1615  BP: (!) 131/96 123/89  Pulse: (!) 109 (!) 109  Resp: 18 16  Temp:    SpO2: 100% 99%    Last Pain:  Vitals:   11/15/23 1615  PainSc: 8                  Franky JONETTA Bald

## 2023-11-15 NOTE — Progress Notes (Signed)
 Diamox 250 mg bid and  continue topiramate 25 mg bid.  Can increase step by step to TPM 50 mg bid.

## 2023-11-15 NOTE — Anesthesia Procedure Notes (Signed)
 Procedure Name: Intubation Date/Time: 11/15/2023 2:15 PM  Performed by: Hedy Jarred, CRNAPre-anesthesia Checklist: Patient identified, Emergency Drugs available, Suction available and Patient being monitored Patient Re-evaluated:Patient Re-evaluated prior to induction Oxygen Delivery Method: Circle System Utilized Preoxygenation: Pre-oxygenation with 100% oxygen Induction Type: IV induction Ventilation: Two handed mask ventilation required and Mask ventilation without difficulty Laryngoscope Size: Mac and 3 Grade View: Grade I Tube type: Oral Tube size: 7.5 mm Number of attempts: 1 Airway Equipment and Method: Stylet and Oral airway Placement Confirmation: ETT inserted through vocal cords under direct vision, positive ETCO2 and breath sounds checked- equal and bilateral Secured at: 22 cm Tube secured with: Tape Dental Injury: Teeth and Oropharynx as per pre-operative assessment

## 2023-11-15 NOTE — Progress Notes (Signed)
 Patient states she was brought to the hospital via [facility] today. She states she has [facility] medical transportation but they scheduled an [employer] for drop off and pick up. Informed patient that she cannot be discharged with an [employer] driver. Called [employer] at [phone] and was connected to the transportation department. Spoke to a representative who said medical transportation can be available when patient is ready for discharge but it may take anywhere from 30 min to 2.5 hours for someone to arrive. Called PACU charge nurse and made her aware. Ok per PACU charge nurse. Dr. Karalee also made aware via secure chat.

## 2023-11-15 NOTE — Transfer of Care (Signed)
 Immediate Anesthesia Transfer of Care Note  Patient: Jade Lopez  Procedure(s) Performed: Lumbar Puncture  Patient Location: PACU  Anesthesia Type:General  Level of Consciousness: awake, alert , and oriented  Airway & Oxygen Therapy: Patient Spontanous Breathing and Patient connected to face mask oxygen  Post-op Assessment: Report given to RN and Post -op Vital signs reviewed and stable  Post vital signs: Reviewed and stable  Last Vitals:  Vitals Value Taken Time  BP 123/91 11/15/23 1530  Temp    Pulse 110 11/15/23 1530  Resp 17 11/15/23 1544  SpO2 100 % 11/15/23 1530  Vitals shown include unfiled device data.  Last Pain:  Vitals:   11/15/23 1525  PainSc: 5       Patients Stated Pain Goal: 3 (11/15/23 1525)  Complications: No notable events documented.

## 2023-11-15 NOTE — H&P (Signed)
 Chief Complaint: Headaches. Request is for LP for diagnostics.  Referring Physician(s): Dohmeier,Carmen  Supervising Physician: Philip Cornet  Patient Status: [facility] - Out-pt  History of Present Illness: Jade Lopez is a 28 y.o. female outpatient. History of DM, fibromyalgia, lupus,  headaches, empty sella turcica. Patient endorses a different type of headache since a work accident that occurred in August  of 2024. Team is requesting a lumbar puncture for diagnostics with general anesthesia.  Patient alert and sitting up  in bed,calm. Denies any fevers, headache, chest pain, SOB, cough, abdominal pain, nausea, vomiting or bleeding.   All labs and medications are within acceptable parameters. Allergies include contrast media. Reaction nausea and vomiting. See list for additional allergies  Return precautions and treatment recommendations and follow-up discussed with the patient  who is agreeable with the plan.     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 Authorizing Provider  Accu-Chek Softclix Lancets lancets daily. 12/15/22   [provider]  acetaminophen  (TYLENOL )  500 MG tablet Take 1,000 mg by mouth 2 (two) times daily.    [provider]  albuterol  (VENTOLIN  HFA) 108 (90 Base) MCG/ACT inhaler Inhale 1-2 puffs into the lungs every 6 (six) hours as needed for wheezing or shortness of breath. 05/10/23   Odell Celinda Balo, MD  BIOTIN PO Take 2 capsules by mouth daily. Hair, skin nails    [provider]  Blood Glucose Monitoring Suppl (MM EASY TOUCH GLUCOSE METER) w/Device KIT Please check your blood sugar daily, in the morning. 12/08/22   Franchot Novel, MD  Blood Glucose Monitoring Suppl DEVI 1 each by Does not apply route 3 (three) times daily. May dispense any manufacturer covered by patient's insurance. 05/10/23   Odell Celinda Balo, MD  budesonide -formoterol  (SYMBICORT ) 80-4.5 MCG/ACT inhaler Inhale 2 puffs into the lungs 2 (two) times daily as needed. Patient taking differently: Inhale 2 puffs into the lungs 2 (two) times daily. 05/19/23   Amoako, Prince, MD  busPIRone  (BUSPAR ) 10 MG tablet Take 3 tablets (30 mg total) by mouth daily. Patient not taking: Reported on 11/11/2023 11/25/22   Franchot Novel, MD  cyclobenzaprine  (FLEXERIL ) 10 MG tablet Take 1 tablet (10 mg total) by mouth 3 (three) times daily as needed for muscle spasms. Patient taking differently: Take 10 mg by mouth 3 (three) times daily. 10/31/23   Christopher Savannah, PA-C  Glucose Blood (BLOOD GLUCOSE TEST STRIPS) STRP 1 each by Does not apply route 3 (three) times daily. Use as directed to check blood sugar. May dispense any manufacturer covered by patient's insurance and fits patient's device. 05/10/23  Odell Celinda Balo, MD  Lancet Device MISC 1 each by Does not apply route 3 (three) times daily. May dispense any manufacturer covered by patient's insurance. 05/10/23   Odell Celinda Balo, MD  Lancets MISC 1 each by Does not apply route 3 (three) times daily. Use as directed to check blood sugar. May dispense any manufacturer covered by patient's insurance and fits patient's  device. 05/10/23   Odell Celinda Balo, MD  medroxyPROGESTERone  (DEPO-PROVERA ) 150 MG/ML injection Inject 150 mg into the muscle every 3 (three) months.    [provider]  MOUNJARO  7.5 MG/0.5ML Pen Inject into the skin. 09/30/23   [provider]  Multiple Vitamins-Minerals (ZINC PO) Take 1 tablet by mouth daily.    [provider]  naproxen  (NAPROSYN ) 500 MG tablet Take 1 tablet (500 mg total) by mouth 2 (two) times daily with a meal. 10/31/23   Christopher Savannah, PA-C  norethindrone  (AYGESTIN ) 5 MG tablet Take 10 mg by mouth at bedtime. 12/11/22   [provider]  ondansetron  (ZOFRAN -ODT) 8 MG disintegrating tablet Take 1 tablet (8 mg total) by mouth every 8 (eight) hours as needed for nausea or vomiting. 05/17/23   Mayer, Jodi R, NP  pregabalin  (LYRICA ) 50 MG capsule Take 1 capsule (50 mg total) by mouth 3 (three) times daily. Patient not taking: Reported on 11/11/2023 05/19/23   Amoako, Prince, MD  tinidazole  (TINDAMAX ) 500 MG tablet Take 2 tablets (1,000 mg total) by mouth daily with breakfast. Patient not taking: Reported on 11/11/2023 09/09/23   Christopher Savannah, PA-C  topiramate  (TOPAMAX ) 25 MG tablet Take 1 tablet (25 mg total) by mouth 2 (two) times daily. 11/08/23   Dohmeier, Dedra, MD  traZODone  (DESYREL ) 300 MG tablet Take 1 tablet (300 mg total) by mouth at bedtime. 09/22/23   Leonce Katz, DO  triamcinolone ointment (KENALOG) 0.1 % Apply 1 Application topically daily at 12 noon.    [provider]  Turmeric, Curcuma [place], (TURMERIC ROOT) POWD Take 15 mLs by mouth daily. Put in food    [provider]     Family History  Problem Relation Age of Onset   Diabetes Mother    Hypertension Mother    Migraines Mother    Diabetes Father    Hypertension Father    Sleep apnea Father     Social History   Socioeconomic History   Marital status: Single    Spouse name: Not on file   Number of children: Not on file   Years of education: Not on  file   Highest education level: Not on file  Occupational History   Not on file  Tobacco Use   Smoking status: Never    Passive exposure: Never   Smokeless tobacco: Never  Vaping Use   Vaping status: Never Used  Substance and Sexual Activity   Alcohol use: Yes    Comment: occasional   Drug use: No   Sexual activity: Yes    Birth control/protection: Injection, Pill  Other Topics Concern   Not on file  Social History Narrative   Pt lives alone    Pt not working    Social Drivers of Corporate Investment Banker Strain: Not on file  Food Insecurity: Medium Risk (08/04/2023)   Received from [facility]   Hunger Vital Sign    Worried About Running Out of Food in the Last Year: Sometimes true    Ran Out of Food in the Last Year: Sometimes true  Transportation Needs: Not  on file (08/04/2023)  Physical Activity: Not on file  Stress: Not on file  Social Connections: Socially Isolated (12/14/2022)   Social Connection and Isolation Panel [NHANES]    Frequency of Communication with Friends and Family: More than three times a week    Frequency of Social Gatherings with Friends and Family: More than three times a week    Attends Religious Services: Never    Database Administrator or Organizations: No    Attends Banker Meetings: Never    Marital Status: Never married     Review of Systems: A 12 point ROS discussed and pertinent positives are indicated in the HPI above.  All other systems are negative.  Review of Systems  Constitutional:  Negative for fatigue and fever.  HENT:  Negative for congestion.   Respiratory:  Negative for cough and shortness of breath.   Gastrointestinal:  Negative for abdominal pain, diarrhea, nausea and vomiting.  Neurological:  Positive for headaches.    Vital Signs: LMP  (LMP Unknown)  B/P 101/71, HR 117, temp 97.6, rr 18    Physical Exam Vitals and nursing note reviewed.  Constitutional:      Appearance: She is well-developed. She  is obese.  HENT:     Head: Normocephalic and atraumatic.  Eyes:     Conjunctiva/sclera: Conjunctivae normal.  Cardiovascular:     Rate and Rhythm: Regular rhythm. Tachycardia present.     Heart sounds: Normal heart sounds.  Pulmonary:     Effort: Pulmonary effort is normal.  Musculoskeletal:        General: Normal range of motion.     Cervical back: Normal range of motion.  Skin:    General: Skin is warm.  Neurological:     General: No focal deficit present.     Mental Status: She is alert and oriented to person, place, and time. Mental status is at baseline.  Psychiatric:        Mood and Affect: Mood normal.        Behavior: Behavior normal.        Thought Content: Thought content normal.        Judgment: Judgment normal.     Imaging: No results found.  Labs:  CBC: Recent Labs    05/08/23 0358 05/09/23 0728 08/05/23 2231 11/15/23 1245  WBC 10.2 14.4* 10.4 9.0  HGB 12.1 13.1 12.5 15.6*  HCT 39.1 43.1 40.7 50.4*  PLT 270 341 312 254    COAGS: No results for input(s): INR, APTT in the last 8760 hours.  BMP: Recent Labs    05/08/23 1501 05/09/23 0709 08/05/23 2231 11/15/23 1245  NA 135 133* 136 137  K 5.0 3.9 4.0 3.8  CL 105 103 103 104  CO2 20* 20* 26 22  GLUCOSE 208* 268* 164* 116*  BUN 9 10 9 7   CALCIUM  9.4 8.5* 9.0 9.5  CREATININE 0.74 0.69 0.76 0.78  GFRNONAA >60 >60 >60 >60    LIVER FUNCTION TESTS: Recent Labs    05/07/23 1631 05/08/23 0358 05/08/23 1500 05/08/23 1501 05/09/23 0709  BILITOT 0.5 0.4  --  0.3 0.4  AST 13* 11*  --  14* 11*  ALT 16 11  --  19 16  ALKPHOS 103 60  --  104 92  PROT 8.2* 4.9*  --  8.2* 7.1  ALBUMIN 3.7 2.2* 4.0 3.9 3.3*     Assessment and Plan:  28 y.o. female outpatient. History of DM, fibromyalgia, lupus,  headaches, empty  sella turcica. Patient endorses a different type of headache since a work accident that occurred in August  of 2024. Team is requesting a lumbar puncture for diagnostics with  general anesthesia.   PLAN: IR Image guided lumbar puncture with general anesthesia.   Risks and benefits discussed with the patient including bleeding, infection, damage to adjacent structures, bowel perforation/fistula connection, and sepsis.  All of the patient's questions were answered, patient is agreeable to proceed. Consent signed and in chart.   Thank you for this interesting consult.  I greatly enjoyed meeting Renalda Locklin and look forward to participating in their care.  A copy of this report was sent to the requesting provider on this date.  Electronically Signed: Delon JAYSON Beagle, NP 11/15/2023, 2:04 PM   I spent a total of  30 Minutes   in face to face in clinical consultation, greater than 50% of which was counseling/coordinating care for lumbar puncture.

## 2023-11-15 NOTE — Procedures (Addendum)
  PROCEDURE SUMMARY:   Successful image-guided lumbar puncture at level of L4-5.  Opening pressure 27.4 cm water.  Yielded 10 mL of clear, colorless  CSF.  No immediate complications.  EBL = trace. Patient tolerated well.    Specimen was sent for labs.   Please see imaging section of Epic for full dictation.   Post procedure orders placed.  - Bedrest with bathroom privileges for 2 hours  - Remain flat in the bed for rest of the day  - Tylenol  for headache, up to 4,000 mg a day

## 2023-11-15 NOTE — H&P (Deleted)
   The note originally documented on this encounter has been moved the the encounter in which it belongs.

## 2023-11-16 ENCOUNTER — Other Ambulatory Visit: Payer: Self-pay | Admitting: Neurology

## 2023-11-16 ENCOUNTER — Telehealth: Payer: Self-pay | Admitting: *Deleted

## 2023-11-16 ENCOUNTER — Encounter (HOSPITAL_COMMUNITY): Payer: Self-pay | Admitting: Interventional Radiology

## 2023-11-16 NOTE — Telephone Encounter (Signed)
 Called the patient back after reviewing her chart and discussing with Dr Chalice. The patient had already been started on topiramate  for her headaches (from her hx of migraines) she was taking 25 mg BID and had started that when it was sent in. She was on this at the time LP was completed. She has tolerated it so far with no side effects but states headache are still bad. We will hold off on starting the diamox  for now. She should continue topiramate  and increase 25 mg in am and 50 mg in pm. She can then increase to 50 mg BID after a week. Pt verbalized understanding.

## 2023-11-16 NOTE — Telephone Encounter (Signed)
-----   Message from McGrath Dohmeier sent at 11/15/2023  5:16 PM EST ----- 25 cm opening pressure  LP.  This is disproportional to BMI.  Start Diamox.

## 2023-11-16 NOTE — Telephone Encounter (Signed)
 Spoke to pt gave LP results Made pt aware of Dr Dohmeier recommendation and Start diamox  made pt aware of prescription already sent to pharmacy Pt wanted a note to say she can return to work . Made pt  aware Dr Chalice didn't place her out of work so she is not able to write a note for her to go back to work . Pt states she hit her head at work so she wants to know if she can go back to work . Made pt aware her hitting her head at work didn't cause IIH so MD not able to write note to return back to work Pt was was very persistent about note back to work .Did ask Augustin PEAK about note . Made pt aware no need for note to be written from Dr Chalice because she didn't take her out of work . I said this statement at least 3 times and she gave me push back about note to return to work Pt states will call back to make a sooner appointment to discuss note back to work with Dr.Dohmeier. Pt hung phone up . Pt did states lower was still sore . Pt had LP yesterday did make pt aware she can take tylenol  for for soreness . Made pt aware please give time for back to feel better since LP was 24 hours ago . Pt states ok

## 2023-11-17 ENCOUNTER — Telehealth: Payer: Self-pay | Admitting: Internal Medicine

## 2023-11-17 NOTE — Telephone Encounter (Signed)
 Patient called stating she is experiencing pain in her lower back, bilateral arms, and weakness in her hands.  Patient requested a return call.

## 2023-11-17 NOTE — Telephone Encounter (Signed)
 FYI - I called patient, flexeril and Tylenol is not helping, patient is coughing green mucus, patient advised to call PCP.

## 2023-11-18 ENCOUNTER — Ambulatory Visit
Admission: EM | Admit: 2023-11-18 | Discharge: 2023-11-18 | Disposition: A | Discharge status: Home / Self Care | Payer: Medicaid Other | Attending: Physician Assistant | Admitting: Physician Assistant

## 2023-11-18 DIAGNOSIS — M545 Low back pain, unspecified: Secondary | ICD-10-CM | POA: Diagnosis not present

## 2023-11-18 LAB — CSF CULTURE W GRAM STAIN: Gram Stain: NONE SEEN

## 2023-11-18 MED ORDER — METHYLPREDNISOLONE ACETATE 80 MG/ML IJ SUSP
80.0000 mg | Freq: Once | INTRAMUSCULAR | Status: AC
  Administered 2023-11-18: 80 mg via INTRAMUSCULAR

## 2023-11-18 MED ORDER — KETOROLAC TROMETHAMINE 30 MG/ML IJ SOLN: 30.0000 mg | Freq: Once | INTRAMUSCULAR | Status: DC

## 2023-11-18 MED ORDER — KETOROLAC TROMETHAMINE 60 MG/2ML IM SOLN
30.0000 mg | Freq: Once | INTRAMUSCULAR | Status: AC
  Administered 2023-11-18: 30 mg via INTRAMUSCULAR

## 2023-11-18 NOTE — ED Triage Notes (Signed)
 Pt states she had a lumbar puncture 3 days ago states she is having back pain and body aches ever since. States she called the doctor that did the procedure and the hospital and left messages and no one has returned her call.  States she has been taking tylenol  and motrin  at home with no relief.

## 2023-11-18 NOTE — Discharge Instructions (Signed)
 Drink beverages with caffeine today.  Continue current medications.  Follow up with your Neurologist and rheumatologist for recheck.

## 2023-11-18 NOTE — ED Provider Notes (Signed)
 [facility]    CSN: [id number] Arrival date & time: 11/18/23  1151      History   Chief Complaint Chief Complaint  Patient presents with   Generalized Body Aches    HPI Jade Lopez is a 28 y.o. female.   Complains of low back pain and a headache.  Patient had a lumbar puncture 2 days ago.  Patient reports that she also feels like she may be having a exacerbation of her lupus.  Patient reports she is not currently followed by a rheumatologist.  Patient had a lumbar puncture on 1 7 due to headaches and MRI findings of empty sella turnica.  Patient is currently on medication for migraines.  The history is provided by the patient. No language interpreter was used.    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/28/2023   Chronic post-traumatic headache, not intractable 10/28/2023   Hypersomnia with sleep apnea 10/28/2023   Visual changes 10/28/2023   Abnormal laboratory test result 07/29/2023   Traumatic brain injury (HCC) 07/14/2023   History of nausea and vomiting 05/19/2023   Fibromyalgia 04/11/2023   Abscess of right breast 04/03/2023   Abdominal wall pain 03/01/2023   Sleep related headaches 02/24/2023   OSA on CPAP 02/24/2023   Sleeps in sitting position due to orthopnea 02/24/2023   Menorrhagia 01/28/2023   Healthcare maintenance 01/28/2023   Chest pain 01/28/2023   Back pain 01/28/2023   Type 2 diabetes mellitus (HCC) 11/28/2022   History of systemic lupus erythematosus (HCC) 11/28/2022   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/28/2022    Endometriosis 11/28/2022   Anxiety 11/28/2022   Vitamin D  deficiency 09/13/2012   Asthma 08/22/2012    Past Surgical History:  Procedure Laterality Date   BREAST LUMPECTOMY Right 2024   CHOLECYSTECTOMY     DILATATION AND CURETTAGE/HYSTEROSCOPY WITH MINERVA     3 total   INCISION AND DRAINAGE ABSCESS  2021   buttocks   IR LUMBAR PUNCTURE  11/15/2023   RADIOLOGY WITH ANESTHESIA N/A 11/15/2023   Procedure: Lumbar Puncture;  Surgeon: Karalee Wilkie POUR, MD;  Location: [facility] OR;  Service: Radiology;  Laterality: N/A;   TONSILLECTOMY AND ADENOIDECTOMY      OB History     Gravida  0   Para  0   Term  0   Preterm  0   AB  0   Living  0      SAB  0   IAB  0   Ectopic  0   Multiple  0   Live Births  0            Home Medications    Prior to Admission medications   Medication Sig Start Date End Date Taking? Authorizing Provider  Accu-Chek Softclix Lancets lancets daily. 12/15/22   [provider]  acetaminophen  (TYLENOL ) 500 MG tablet Take 1,000 mg by mouth 2 (two) times daily.    [provider]  acetaZOLAMIDE  (DIAMOX ) 250 MG tablet Take 1 tablet (250 mg total) by mouth 2 (two) times daily. 11/15/23   Dohmeier, Dedra, MD  albuterol  (VENTOLIN  HFA) 108 (90 Base) MCG/ACT inhaler Inhale 1-2 puffs into the lungs every 6 (six) hours as needed for wheezing or shortness of breath. 05/10/23   Odell Celinda Balo, MD  BIOTIN PO Take 2 capsules by mouth daily. Hair, skin nails    [provider]  Blood Glucose Monitoring Suppl (MM EASY TOUCH GLUCOSE METER) w/Device KIT Please check your blood sugar daily, in the morning. 12/08/22   Franchot Novel, MD  Blood Glucose Monitoring Suppl DEVI 1 each by Does not apply route 3 (three) times daily. May dispense any manufacturer covered by patient's insurance. 05/10/23   Odell Celinda Balo, MD  budesonide -formoterol  (SYMBICORT ) 80-4.5 MCG/ACT inhaler Inhale 2 puffs into the lungs 2 (two) times daily as  needed. Patient taking differently: Inhale 2 puffs into the lungs 2 (two) times daily. 05/19/23   Amoako, Prince, MD  busPIRone  (BUSPAR ) 10 MG tablet Take 3 tablets (30 mg total) by mouth daily. Patient not taking: Reported on 11/11/2023 11/25/22   Franchot Novel, MD  cyclobenzaprine  (FLEXERIL ) 10 MG tablet Take 1 tablet (10 mg total) by mouth 3 (three) times daily as needed for muscle spasms. Patient taking differently: Take 10 mg by mouth 3 (three) times daily. 10/31/23   Christopher Savannah, PA-C  Glucose Blood (BLOOD GLUCOSE TEST STRIPS) STRP 1 each by Does not apply route 3 (three) times daily. Use as directed to check blood sugar. May dispense any manufacturer covered by patient's insurance and fits patient's device. 05/10/23   Odell Celinda Balo, MD  Lancet Device MISC 1 each by Does not apply route 3 (three) times daily. May dispense any manufacturer covered by patient's insurance. 05/10/23   Odell Celinda Balo, MD  Lancets MISC 1 each by Does not apply route 3 (three) times daily. Use as directed to check blood sugar. May dispense any manufacturer covered by patient's insurance and fits patient's device. 05/10/23   Odell Celinda Balo, MD  medroxyPROGESTERone  (DEPO-PROVERA ) 150 MG/ML injection Inject 150 mg into the muscle every 3 (three) months.    [provider]  MOUNJARO  7.5 MG/0.5ML Pen Inject into the skin. 09/30/23   [provider]  Multiple Vitamins-Minerals (ZINC PO) Take 1 tablet by mouth daily.    [provider]  naproxen  (NAPROSYN ) 500 MG tablet Take 1 tablet (500 mg total) by mouth 2 (two) times daily with a meal. 10/31/23   Christopher Savannah, PA-C  norethindrone  (AYGESTIN ) 5 MG tablet Take 10 mg by mouth at bedtime. 12/11/22   [provider]  ondansetron  (ZOFRAN -ODT) 8 MG disintegrating tablet Take 1 tablet (8 mg total) by mouth every 8 (eight) hours as needed for nausea or vomiting. 05/17/23   Mayer, Jodi R, NP  pregabalin  (LYRICA ) 50 MG capsule Take 1  capsule (50 mg total) by mouth 3 (three) times daily. Patient not taking: Reported on 11/11/2023 05/19/23   Amoako, Prince, MD  tinidazole  (TINDAMAX ) 500 MG tablet Take 2 tablets (1,000 mg total) by mouth daily with breakfast. Patient not taking: Reported on 11/11/2023 09/09/23   Christopher Savannah, PA-C  topiramate  (TOPAMAX ) 25 MG tablet Take 1 tablet (25 mg total) by mouth 2 (two) times daily. 11/08/23   Dohmeier, Dedra, MD  traZODone  (DESYREL ) 300 MG tablet Take 1 tablet (300 mg total) by mouth at bedtime. 09/22/23   Leonce Katz, DO  triamcinolone ointment (KENALOG) 0.1 % Apply  1 Application topically daily at 12 noon.    [provider]  Turmeric, Curcuma Longa, (TURMERIC ROOT) POWD Take 15 mLs by mouth daily. Put in food    [provider]    Family History Family History  Problem Relation Age of Onset   Diabetes Mother    Hypertension Mother    Migraines Mother    Diabetes Father    Hypertension Father    Sleep apnea Father     Social History Social History   Tobacco Use   Smoking status: Never    Passive exposure: Never   Smokeless tobacco: Never  Vaping Use   Vaping status: Never Used  Substance Use Topics   Alcohol use: Yes    Comment: occasional   Drug use: No     Allergies   Clindamycin/lincomycin, Gabapentin, Other, Pregabalin , Cephalexin , Iodinated contrast media, Meloxicam, and Clindamycin   Review of Systems Review of Systems  All other systems reviewed and are negative.    Physical Exam Triage Vital Signs ED Triage Vitals  Encounter Vitals Group     BP 11/18/23 1206 126/86     Systolic BP Percentile --      Diastolic BP Percentile --      Pulse Rate 11/18/23 1206 (!) 108     Resp 11/18/23 1206 15     Temp 11/18/23 1206 98.5 F (36.9 C)     Temp Source 11/18/23 1206 Oral     SpO2 11/18/23 1206 98 %     Weight --      Height --      Head Circumference --      Peak Flow --      Pain Score 11/18/23 1220 9     Pain Loc --       Pain Education --      Exclude from Growth Chart --    No data found.  Updated Vital Signs BP 126/86 (BP Location: Right Arm)   Pulse (!) 108   Temp 98.5 F (36.9 C) (Oral)   Resp 15   LMP  (LMP Unknown)   SpO2 98%   Visual Acuity Right Eye Distance:   Left Eye Distance:   Bilateral Distance:    Right Eye Near:   Left Eye Near:    Bilateral Near:     Physical Exam Vitals and nursing note reviewed.  Constitutional:      Appearance: She is well-developed.  HENT:     Head: Normocephalic.     Right Ear: Tympanic membrane normal.     Left Ear: Tympanic membrane normal.     Nose: Nose normal.     Mouth/Throat:     Mouth: Mucous membranes are moist.  Eyes:     Extraocular Movements: Extraocular movements intact.     Conjunctiva/sclera: Conjunctivae normal.     Pupils: Pupils are equal, round, and reactive to light.  Cardiovascular:     Rate and Rhythm: Normal rate.  Pulmonary:     Effort: Pulmonary effort is normal.  Abdominal:     General: There is no distension.  Musculoskeletal:        General: Normal range of motion.     Cervical back: Normal range of motion.  Skin:    General: Skin is warm.  Neurological:     General: No focal deficit present.     Mental Status: She is alert and oriented to person, place, and time.  Psychiatric:        Mood and  Affect: Mood normal.      UC Treatments / Results  Labs (all labs ordered are listed, but only abnormal results are displayed) Labs Reviewed - No data to display  EKG   Radiology No results found.  Procedures Procedures (including critical care time)  Medications Ordered in UC Medications  methylPREDNISolone  acetate (DEPO-MEDROL ) injection 80 mg (has no administration in time range)  ketorolac  (TORADOL ) 30 MG/ML injection 30 mg (has no administration in time range)    Initial Impression / Assessment and Plan / UC Course  I have reviewed the triage vital signs and the nursing notes.  Pertinent labs  & imaging results that were available during my care of the patient were reviewed by me and considered in my medical decision making (see chart for details).     That has a headache post lumbar puncture.  Patient also has bodyaches that she feels like are related to her lupus.  Patient is given injection of Toradol  and Solu-Medrol .  I advised her to drink caffeinated beverages.  She is advised to follow-up with her neurologist as well as her rheumatologist.  Patient is advised to go to the emergency department if symptoms worsen or change. Final Clinical Impressions(s) / UC Diagnoses   Final diagnoses:  Acute midline low back pain without sciatica   Discharge Instructions   None    ED Prescriptions   None    PDMP not reviewed this encounter. An After Visit Summary was printed and given to the patient.       Flint Sonny POUR, PA-C 11/18/23 1301

## 2023-11-19 ENCOUNTER — Telehealth: Payer: Self-pay | Admitting: Sports Medicine

## 2023-11-19 NOTE — Telephone Encounter (Signed)
 Pt would like this is be sent as an urgent referral. She had a lumbar puncture on Monday and is still having pain, does not seem to comfortable with the diagnosis she was given and still lots of symptoms.

## 2023-11-19 NOTE — Telephone Encounter (Signed)
 Pt has seen Dr. Vickey Huger at [facility] and would like to be referred to another neurologist for a second opinion on the same issue. OK to see [facility].

## 2023-11-22 ENCOUNTER — Telehealth: Payer: Self-pay | Admitting: Internal Medicine

## 2023-11-22 ENCOUNTER — Other Ambulatory Visit: Payer: Self-pay | Admitting: Sports Medicine

## 2023-11-22 DIAGNOSIS — R519 Headache, unspecified: Secondary | ICD-10-CM

## 2023-11-22 DIAGNOSIS — G8929 Other chronic pain: Secondary | ICD-10-CM

## 2023-11-22 DIAGNOSIS — E236 Other disorders of pituitary gland: Secondary | ICD-10-CM

## 2023-11-22 DIAGNOSIS — M329 Systemic lupus erythematosus, unspecified: Secondary | ICD-10-CM

## 2023-11-22 DIAGNOSIS — F411 Generalized anxiety disorder: Secondary | ICD-10-CM

## 2023-11-22 DIAGNOSIS — G4700 Insomnia, unspecified: Secondary | ICD-10-CM

## 2023-11-22 DIAGNOSIS — M0600 Rheumatoid arthritis without rheumatoid factor, unspecified site: Secondary | ICD-10-CM

## 2023-11-22 DIAGNOSIS — G44319 Acute post-traumatic headache, not intractable: Secondary | ICD-10-CM

## 2023-11-22 DIAGNOSIS — S060X0D Concussion without loss of consciousness, subsequent encounter: Secondary | ICD-10-CM

## 2023-11-22 MED ORDER — PREDNISONE 10 MG PO TABS: ORAL_TABLET | ORAL | 0 refills | Status: AC

## 2023-11-22 NOTE — Telephone Encounter (Signed)
 Neuro referral placed.

## 2023-11-22 NOTE — Telephone Encounter (Signed)
 Patient states she also needs a refill of flexeril. Please advise.

## 2023-11-22 NOTE — Telephone Encounter (Signed)
 Pt called and left as message on the answering machine about wanting to speak with someone about her flair up. Pt states she's in pain throughout her body. Pt did state that she went to urgent care and received a shot for her pain but the pain came back. Pt states her face is breaking out and its burning. Pt would like a call back about this at [phone]

## 2023-11-22 NOTE — Telephone Encounter (Signed)
 Contacted the patient. Patient states she feels she is having a flare. Patient states she went to urgent care on 11/19/2023 and got a shot for inflammation and pain. Patient states the shot helped but the pain is now back. Patient states the flexeril  and tylenol  she has been taking helped a little after the shot but not enough. Patient states she started taking naproxen  again. Patient states she is having pain in her arms, legs, and back. Patient states she is getting spasms. Patient states her face is still breaking out and burning. Please advise.

## 2023-11-22 NOTE — Progress Notes (Signed)
Neuro referral sent

## 2023-11-22 NOTE — Telephone Encounter (Signed)
 Patient would like the flexeril sent to [facility] on [address].

## 2023-11-22 NOTE — Telephone Encounter (Signed)
 I will send a prednisone  taper due to acute flare up of symptoms. She can also continue the flexeril  as needed.   Since getting side effects with hydroxychloroquine  she is not on anything right now. We should reschedule follow up to be soon to discuss if she needs to get back on anything longer term.

## 2023-11-22 NOTE — Telephone Encounter (Signed)
 Advised Dr. Dimple Casey patient would like the medication sent to [facility] on [address].

## 2023-11-22 NOTE — Telephone Encounter (Signed)
 Patient advised Dr. Jeannetta will send a prednisone  taper due to acute flare up of symptoms. She can also continue the flexeril  as needed.    Since getting side effects with hydroxychloroquine  she is not on anything right now. We should reschedule follow up to be soon to discuss if she needs to get back on anything longer term. Patient verbalized understanding. Patient transferred to the front to be scheduled as soon as possible.

## 2023-11-22 NOTE — Telephone Encounter (Signed)
 Please send medication to [facility] on [address].

## 2023-11-23 MED ORDER — CYCLOBENZAPRINE HCL 10 MG PO TABS: 10.0000 mg | ORAL_TABLET | Freq: Three times a day (TID) | ORAL | 1 refills | Status: DC | PRN

## 2023-11-23 NOTE — Telephone Encounter (Signed)
 Rx sent today to [facility]

## 2023-11-23 NOTE — Telephone Encounter (Signed)
 Patient advised Rx sent today to [facility]. Patient verbalized understanding.

## 2023-11-23 NOTE — Addendum Note (Signed)
 Addended by: Fuller Plan on: 11/23/2023 12:47 PM   Modules accepted: Orders

## 2023-11-24 LAB — OLIGOCLONAL BANDS, CSF + SERM

## 2023-11-24 NOTE — Telephone Encounter (Signed)
 Pt notified via My Chart

## 2023-11-25 ENCOUNTER — Telehealth: Payer: Self-pay | Admitting: Internal Medicine

## 2023-11-25 NOTE — Telephone Encounter (Signed)
Prednisone is probably causing her increased blood sugar. This is usually not harmful when taken for short durations, unless she is noticing any major side effect from sugar in 200s. But she should prioritize drinking plenty of water and limiting sugary foods.

## 2023-11-25 NOTE — Telephone Encounter (Signed)
Patient called stating this morning when she checked her sugar it was 250 and is worried the Prednisone medication that Dr. Dimple Casey prescribed to help with her flair has something to do with the spike in her sugar.  Patient requested a return call.

## 2023-11-26 NOTE — Telephone Encounter (Signed)
Patient advised Her last CT scan Dr. Dimple Casey can see from June had normal appearance of her spleen. Dr. Dimple Casey does not see any change in her recent red blood cells or white blood cells that would indicate a spleen problem either. If that has been the concern it might need to be rechecked at a time when symptoms are more active. Patient verbalized understanding.

## 2023-11-26 NOTE — Telephone Encounter (Signed)
Her last CT scan I see from June had normal appearance of her spleen. I don't see any change in her recent red blood cells or white blood cells that would indicate a spleen problem either. If that has been the concern it might need to be rechecked at a time when symptoms are more active.

## 2023-11-26 NOTE — Telephone Encounter (Signed)
Patient advised Prednisone is probably causing her increased blood sugar. This is usually not harmful when taken for short durations, unless she is noticing any major side effect from sugar in 200s. But she should prioritize drinking plenty of water and limiting sugary foods. Patient verbalized understanding.   Patient states she wanted to let Dr. Dimple Casey know her spleen has been enlarged for a while. Patient states she has had scans and no one knows why her spleen is enlarged. Patient states she is having pain in the left side under and below the last rib and believes it is from the spleen. Patient states the pain is not terrible right now but she knows it is getting worse.

## 2023-11-30 ENCOUNTER — Telehealth: Payer: Self-pay

## 2023-11-30 NOTE — Telephone Encounter (Signed)
She will need to get checked out face to face for this if symptoms are getting worse. I am not sure these are due to her arthritis if it did not improve at all with oral steroids starting at 40 mg daily dose we sent in previously. She called in multiple times this month, including about productive coughing with discolored mucus that might also indicate an infection or bronchitis to rule out.

## 2023-11-30 NOTE — Telephone Encounter (Signed)
Patient advised She will need to get checked out face to face for this if symptoms are getting worse. Dr. Dimple Casey is not sure these are due to her arthritis if it did not improve at all with oral steroids starting at 40 mg daily dose we sent in previously. She called in multiple times this month, including about productive coughing with discolored mucus that might also indicate an infection or bronchitis to rule out. Patient verbalized understanding and will see her PCP Friday.

## 2023-11-30 NOTE — Telephone Encounter (Signed)
Patient contacted the office and states her flare up will not go down. Patient states she is now light headed and has shortness of breathe. Patient states she has hand fluid around her lungs and heart before and believes there may be more fluid. Patient states she has pain in her left lung and a pulling sensation in her right breast. Patient states the flexeril has helped some of her pain but the prednisone has not helped. Patient states her primary care told her to contact us. Patient states she does have an appointment with her primary care on Friday. Please advise.

## 2023-12-01 ENCOUNTER — Encounter (HOSPITAL_COMMUNITY): Payer: Self-pay

## 2023-12-01 ENCOUNTER — Emergency Department (HOSPITAL_COMMUNITY)
Admission: EM | Admit: 2023-12-01 | Discharge: 2023-12-02 | Disposition: A | Discharge status: Home / Self Care | Payer: Medicaid Other | Attending: Emergency Medicine | Admitting: Emergency Medicine

## 2023-12-01 ENCOUNTER — Other Ambulatory Visit: Payer: Self-pay

## 2023-12-01 ENCOUNTER — Emergency Department (HOSPITAL_COMMUNITY): Payer: Medicaid Other

## 2023-12-01 DIAGNOSIS — E119 Type 2 diabetes mellitus without complications: Secondary | ICD-10-CM | POA: Insufficient documentation

## 2023-12-01 DIAGNOSIS — R0602 Shortness of breath: Secondary | ICD-10-CM | POA: Diagnosis not present

## 2023-12-01 DIAGNOSIS — R0789 Other chest pain: Secondary | ICD-10-CM | POA: Insufficient documentation

## 2023-12-01 DIAGNOSIS — R059 Cough, unspecified: Secondary | ICD-10-CM | POA: Diagnosis present

## 2023-12-01 DIAGNOSIS — R42 Dizziness and giddiness: Secondary | ICD-10-CM | POA: Diagnosis not present

## 2023-12-01 DIAGNOSIS — M791 Myalgia, unspecified site: Secondary | ICD-10-CM | POA: Insufficient documentation

## 2023-12-01 DIAGNOSIS — R52 Pain, unspecified: Secondary | ICD-10-CM

## 2023-12-01 LAB — BASIC METABOLIC PANEL
Anion gap: 13 (ref 5–15)
BUN: 13 mg/dL (ref 6–20)
CO2: 21 mmol/L — ABNORMAL LOW (ref 22–32)
Calcium: 9.5 mg/dL (ref 8.9–10.3)
Chloride: 104 mmol/L (ref 98–111)
Creatinine, Ser: 0.82 mg/dL (ref 0.44–1.00)
GFR, Estimated: >60 mL/min (ref >60)
Glucose, Bld: 204 mg/dL — ABNORMAL HIGH (ref 70–99)
Potassium: 4.3 mmol/L (ref 3.5–5.1)
Sodium: 138 mmol/L (ref 135–145)

## 2023-12-01 LAB — CBC
HCT: 43.6 % (ref 36.0–46.0)
Hemoglobin: 13.7 g/dL (ref 12.0–15.0)
MCH: 26.3 pg (ref 26.0–34.0)
MCHC: 31.4 g/dL (ref 30.0–36.0)
MCV: 83.8 fL (ref 80.0–100.0)
Platelets: 310 10*3/uL (ref 150–400)
RBC: 5.2 MIL/uL — ABNORMAL HIGH (ref 3.87–5.11)
RDW: 14.1 % (ref 11.5–15.5)
WBC: 15.5 10*3/uL — ABNORMAL HIGH (ref 4.0–10.5)
nRBC: 0 % (ref 0.0–0.2)

## 2023-12-01 LAB — TROPONIN I (HIGH SENSITIVITY)
Troponin I (High Sensitivity): 4 ng/L (ref <18)
Troponin I (High Sensitivity): 3 ng/L (ref <18)

## 2023-12-01 LAB — URINALYSIS, ROUTINE W REFLEX MICROSCOPIC
Bilirubin Urine: NEGATIVE
Glucose, UA: 50 mg/dL — AB
Hgb urine dipstick: NEGATIVE
Ketones, ur: NEGATIVE mg/dL
Leukocytes,Ua: NEGATIVE
Nitrite: NEGATIVE
Protein, ur: NEGATIVE mg/dL
Specific Gravity, Urine: 1.014 (ref 1.005–1.030)
pH: 7 (ref 5.0–8.0)

## 2023-12-01 LAB — HCG, SERUM, QUALITATIVE: Preg, Serum: NEGATIVE

## 2023-12-01 NOTE — ED Triage Notes (Signed)
Pt arrived POV for c/o of chest pain, sob, pulling sensation in left upper chest, coughing up green phlegm, yesterday reports "blood tinge" in sputum, intermittent lightheadedness, pt also reports "having an autoimmune flare-up, which is making my whole body hurt". Currently taking prednisone & OTC Tylenol. Pt concern for possible PNA. Had a lumbar puncture earlier this month for fluid build-up on spinal column, pt now concern she has fluid build-up around her heart and lungs now, no hx of CHF. A&O x4, NAD noted at current.

## 2023-12-02 ENCOUNTER — Emergency Department (HOSPITAL_COMMUNITY): Payer: Medicaid Other

## 2023-12-02 ENCOUNTER — Telehealth: Payer: Self-pay | Admitting: Internal Medicine

## 2023-12-02 MED ORDER — KETOROLAC TROMETHAMINE 15 MG/ML IJ SOLN: 15.0000 mg | Freq: Once | INTRAMUSCULAR | Status: DC

## 2023-12-02 MED ORDER — HYDROCODONE-ACETAMINOPHEN 5-325 MG PO TABS: 1.0000 | ORAL_TABLET | Freq: Four times a day (QID) | ORAL | 0 refills | Status: DC | PRN

## 2023-12-02 MED ORDER — KETOROLAC TROMETHAMINE 15 MG/ML IJ SOLN
15.0000 mg | Freq: Once | INTRAMUSCULAR | Status: AC
  Administered 2023-12-02: 15 mg via INTRAMUSCULAR
  Filled 2023-12-02: qty 1

## 2023-12-02 NOTE — ED Provider Notes (Signed)
Ag[facility] EMERGENCY DEPARTMENT AT [facility] Provider Note   CSN: [id number] Arrival date & time: 12/01/23  1810     History  Chief Complaint  Patient presents with   Multiple Complaints    Jade Lopez is a 28 y.o. female.  Patient with past medical history significant for type II DM, anxiety, history of SLE, fibromyalgia, TBI presents to the emergency department complaining of chest discomfort described as "tugging or pulling", shortness of breath, productive cough, intermittent lightheadedness ongoing for the past 2 weeks.  The patient states that earlier this month she had a lumbar puncture as part of a headache evaluation and since that time she is concerned that she may have fluid building up around her heart and lungs.  She denies history of CHF, denies dependent edema, orthopnea.  Patient denies abdominal pain, nausea, vomiting.  She also endorses widespread body aches thought to be due to to her autoimmune disorder.  She is on a course of prednisone and over-the-counter Tylenol.  HPI     Home Medications Prior to Admission medications   Medication Sig Start Date End Date Taking? Authorizing Provider  HYDROcodone-acetaminophen (NORCO/VICODIN) 5-325 MG tablet Take 1 tablet by mouth every 6 (six) hours as needed. 12/02/23  Yes Barrie Dunker B, PA-C  Accu-Chek Softclix Lancets lancets daily. 12/15/22   [provider]  acetaminophen (TYLENOL) 500 MG tablet Take 1,000 mg by mouth 2 (two) times daily.    [provider]  acetaZOLAMIDE (DIAMOX) 250 MG tablet Take 1 tablet (250 mg total) by mouth 2 (two) times daily. 11/15/23   Dohmeier, Porfirio Mylar, MD  albuterol (VENTOLIN HFA) 108 (90 Base) MCG/ACT inhaler Inhale 1-2 puffs into the lungs every 6 (six) hours as needed for wheezing or shortness of breath. 05/10/23   Marinda Elk, MD  BIOTIN PO Take 2 capsules by mouth daily. Hair, skin nails    [provider]  Blood Glucose Monitoring Suppl (MM  EASY TOUCH GLUCOSE METER) w/Device KIT Please check your blood sugar daily, in the morning. 12/08/22   Marolyn Haller, MD  Blood Glucose Monitoring Suppl DEVI 1 each by Does not apply route 3 (three) times daily. May dispense any manufacturer covered by patient's insurance. 05/10/23   Marinda Elk, MD  budesonide-formoterol (SYMBICORT) 80-4.5 MCG/ACT inhaler Inhale 2 puffs into the lungs 2 (two) times daily as needed. Patient taking differently: Inhale 2 puffs into the lungs 2 (two) times daily. 05/19/23   Kathleen Lime, MD  busPIRone (BUSPAR) 10 MG tablet Take 3 tablets (30 mg total) by mouth daily. Patient not taking: Reported on 11/11/2023 11/25/22   Marolyn Haller, MD  cyclobenzaprine (FLEXERIL) 10 MG tablet Take 1 tablet (10 mg total) by mouth 3 (three) times daily as needed for muscle spasms. 11/23/23   Rice, Jamesetta Orleans, MD  Glucose Blood (BLOOD GLUCOSE TEST STRIPS) STRP 1 each by Does not apply route 3 (three) times daily. Use as directed to check blood sugar. May dispense any manufacturer covered by patient's insurance and fits patient's device. 05/10/23   Marinda Elk, MD  Lancet Device MISC 1 each by Does not apply route 3 (three) times daily. May dispense any manufacturer covered by patient's insurance. 05/10/23   Marinda Elk, MD  Lancets MISC 1 each by Does not apply route 3 (three) times daily. Use as directed to check blood sugar. May dispense any manufacturer covered by patient's insurance and fits patient's device. 05/10/23   Marinda Elk, MD  medroxyPROGESTERone (  DEPO-PROVERA) 150 MG/ML injection Inject 150 mg into the muscle every 3 (three) months.    [provider]  MOUNJARO 7.5 MG/0.5ML Pen Inject into the skin. 09/30/23   [provider]  Multiple Vitamins-Minerals (ZINC PO) Take 1 tablet by mouth daily.    [provider]  naproxen (NAPROSYN) 500 MG tablet Take 1 tablet (500 mg total) by mouth 2 (two) times daily with a meal.  10/31/23   Wallis Bamberg, PA-C  norethindrone (AYGESTIN) 5 MG tablet Take 10 mg by mouth at bedtime. 12/11/22   [provider]  ondansetron (ZOFRAN-ODT) 8 MG disintegrating tablet Take 1 tablet (8 mg total) by mouth every 8 (eight) hours as needed for nausea or vomiting. 05/17/23   [name], NP  predniSONE (DELTASONE) 10 MG tablet Take 4 tablets (40 mg total) by mouth daily with breakfast for 3 days, THEN 3 tablets (30 mg total) daily with breakfast for 3 days, THEN 2 tablets (20 mg total) daily with breakfast for 3 days, THEN 1 tablet (10 mg total) daily with breakfast for 3 days. 11/22/23 12/04/23  Fuller Plan, MD  pregabalin (LYRICA) 50 MG capsule Take 1 capsule (50 mg total) by mouth 3 (three) times daily. Patient not taking: Reported on 11/11/2023 05/19/23   Kathleen Lime, MD  tinidazole [facility]) 500 MG tablet Take 2 tablets (1,000 mg total) by mouth daily with breakfast. Patient not taking: Reported on 11/11/2023 09/09/23   Wallis Bamberg, PA-C  topiramate (TOPAMAX) 25 MG tablet Take 1 tablet (25 mg total) by mouth 2 (two) times daily. 11/08/23   Dohmeier, Porfirio Mylar, MD  traZODone (DESYREL) 300 MG tablet Take 1 tablet (300 mg total) by mouth at bedtime. 09/22/23   Richardean Sale, DO  triamcinolone ointment (KENALOG) 0.1 % Apply 1 Application topically daily at 12 noon.    [provider]  Turmeric, Curcuma [place]a, (TURMERIC ROOT) POWD Take 15 mLs by mouth daily. Put in food    [provider]      Allergies    Clindamycin/lincomycin, Gabapentin, Other, Pregabalin, Cephalexin, Iodinated contrast media, Meloxicam, and Clindamycin    Review of Systems   Review of Systems  Physical Exam Updated Vital Signs BP (!) 131/95   Pulse (!) 103   Temp 97.8 F (36.6 C) (Oral)   Resp 17   Ht 5\' 4"  (1.626 m)   Wt (!) 180.5 kg   LMP  (LMP Unknown)   SpO2 100%   BMI 68.32 kg/m  Physical Exam Vitals and nursing note reviewed.  Constitutional:      General: She is not  in acute distress.    Appearance: She is well-developed. She is obese.  HENT:     Head: Normocephalic and atraumatic.  Eyes:     Conjunctiva/sclera: Conjunctivae normal.  Cardiovascular:     Rate and Rhythm: Normal rate and regular rhythm.     Heart sounds: No murmur heard. Pulmonary:     Effort: Pulmonary effort is normal. No respiratory distress.     Breath sounds: Normal breath sounds.  Abdominal:     Palpations: Abdomen is soft.     Tenderness: There is no abdominal tenderness.  Musculoskeletal:        General: No swelling.     Cervical back: Neck supple.  Skin:    General: Skin is warm and dry.     Capillary Refill: Capillary refill takes less than 2 seconds.  Neurological:     Mental Status: She is alert.  Psychiatric:  Mood and Affect: Mood normal.     ED Results / Procedures / Treatments   Labs (all labs ordered are listed, but only abnormal results are displayed) Labs Reviewed  BASIC METABOLIC PANEL - Abnormal; Notable for the following components:      Result Value   CO2 21 (*)    Glucose, Bld 204 (*)    All other components within normal limits  CBC - Abnormal; Notable for the following components:   WBC 15.5 (*)    RBC 5.20 (*)    All other components within normal limits  URINALYSIS, ROUTINE W REFLEX MICROSCOPIC - Abnormal; Notable for the following components:   Glucose, UA 50 (*)    All other components within normal limits  HCG, SERUM, QUALITATIVE  TROPONIN I (HIGH SENSITIVITY)  TROPONIN I (HIGH SENSITIVITY)    EKG EKG Interpretation Date/Time:  Wednesday December 01 2023 19:10:01 EST Ventricular Rate:  116 PR Interval:  126 QRS Duration:  76 QT Interval:  298 QTC Calculation: 414 R Axis:   30  Text Interpretation: Sinus tachycardia Minimal voltage criteria for LVH, may be normal variant ( R in aVL ) Borderline ECG Confirmed by Tilden Fossa [phone]) on 12/01/2023 11:21:06 PM  Radiology CT Chest Wo Contrast Result Date:  12/02/2023 CLINICAL DATA:  Respiratory illness, nondiagnostic xray Worsening cough EXAM: CT CHEST WITHOUT CONTRAST TECHNIQUE: Multidetector CT imaging of the chest was performed following the standard protocol without IV contrast. RADIATION DOSE REDUCTION: This exam was performed according to the departmental dose-optimization program which includes automated exposure control, adjustment of the mA and/or kV according to patient size and/or use of iterative reconstruction technique. COMPARISON:  10/06/2020 FINDINGS: Cardiovascular: No significant vascular findings. Normal heart size. No pericardial effusion. Mediastinum/Nodes: No enlarged mediastinal or axillary lymph nodes. Thyroid gland, trachea, and esophagus demonstrate no significant findings. Lungs/Pleura: Lungs are clear. No pleural effusion or pneumothorax. Upper Abdomen: No acute abnormality. Musculoskeletal: No chest wall mass or suspicious bone lesions identified. IMPRESSION: 1. Normal CT examination of the chest. No acute intrathoracic pathology identified. Electronically Signed   By: Helyn Numbers M.D.   On: 12/02/2023 01:44   DG Chest 2 View Result Date: 12/01/2023 CLINICAL DATA:  Chest pain, short of breath EXAM: CHEST - 2 VIEW COMPARISON:  05/07/2023 FINDINGS: The heart size and mediastinal contours are within normal limits. Both lungs are clear. The visualized skeletal structures are unremarkable. IMPRESSION: No active cardiopulmonary disease. Electronically Signed   By: Sharlet Salina M.D.   On: 12/01/2023 20:09    Procedures Procedures    Medications Ordered in ED Medications  ketorolac (TORADOL) 15 MG/ML injection 15 mg (15 mg Intramuscular Given 12/02/23 0122)    ED Course/ Medical Decision Making/ A&P                                 Medical Decision Making Amount and/or Complexity of Data Reviewed Labs: ordered. Radiology: ordered.  Risk Prescription drug management.   This patient presents to the ED for concern of  cough, body aches, shortness of breath, this involves an extensive number of treatment options, and is a complaint that carries with it a high risk of complications and morbidity.  The differential diagnosis includes pneumonia, viral illness, autoimmune flare, others   Co morbidities that complicate the patient evaluation  SLE, fibromyalgia   Additional history obtained:   External records from outside source obtained and reviewed including rheumatology notes  Lab Tests:  I Ordered, and personally interpreted labs.  The pertinent results include: Leukocytosis with white count of 15,500 (consistent with current steroid use), negative pregnancy test   Imaging Studies ordered:  I ordered imaging studies including chest x-ray and CT chest without contrast I independently visualized and interpreted imaging which showed no acute findings I agree with the radiologist interpretation   Cardiac Monitoring: / EKG:  The patient was maintained on a cardiac monitor.  I personally viewed and interpreted the cardiac monitored which showed an underlying rhythm of: Sinus tachycardia   Problem List / ED Course / Critical interventions / Medication management   I ordered medication including Toradol for body aches Reevaluation of the patient after these medicines showed that the patient improved I have reviewed the patients home medicines and have made adjustments as needed   Social Determinants of Health:  Patient has Medicaid for primary health insurance type   Test / Admission - Considered:  Chest CT unremarkable.  No sign of pneumonia.  Patient's symptoms been ongoing for 2 weeks and I see no indication for respiratory panel at this time.  Labs are grossly unremarkable.  Patient does have leukocytosis which is consistent with her current steroid use.  No sharp chest pain.  Patient states that her heart rate is at baseline at this time.  Chart review shows similar heart rates at  previous visits.  Presentation not consistent with PE at this time with body aches and productive cough.  Question potential virus versus autoimmune flare.  Plan to discharge home with recommendations for follow-up with rheumatology as rheumatology reportedly recommended she get evaluated for possible pneumonia.  Will prescribe short course of pain medication and discharged home with return precautions.         Final Clinical Impression(s) / ED Diagnoses Final diagnoses:  Cough, unspecified type  Generalized body aches    Rx / DC Orders ED Discharge Orders          Ordered    HYDROcodone-acetaminophen (NORCO/VICODIN) 5-325 MG tablet  Every 6 hours PRN        12/02/23 0319              Darrick Grinder, PA-C 12/02/23 Ned Card, MD 12/02/23 [phone]

## 2023-12-02 NOTE — Addendum Note (Signed)
Addended by: Evon Slack on: 12/02/2023 01:37 PM   Modules accepted: Orders

## 2023-12-02 NOTE — Discharge Instructions (Addendum)
Your workup today was reassuring.  Please follow-up with your primary team and rheumatologist for further management of your pain flare.  If you develop any life-threatening symptoms return to the emergency department.  I did prescribe a short course of pain medication.  Please take as directed.  Do not exceed 4000 mg of acetaminophen from all sources in a 24-hour period.

## 2023-12-02 NOTE — Telephone Encounter (Signed)
Contacted the patient and patient states she is still having body pains and the pulling sensation in her chest. Patient states she went to the hospital yesterday and was told that they did not think it was pneumonia or anything else. Patient states the doctors told her they think it is autoimmune related and to contact us. Patient states she was going to be admitted but they do not have a rheumatologist. Please advise.

## 2023-12-02 NOTE — Telephone Encounter (Signed)
Pt called and left a message stating she having pains in her chest and body. Pt states she went to the hospital and they stated this might be autoimmune. Pt requested to speak with someone about this as soon as possible.

## 2023-12-08 ENCOUNTER — Ambulatory Visit
Admission: EM | Admit: 2023-12-08 | Discharge: 2023-12-08 | Disposition: A | Discharge status: Home / Self Care | Payer: Medicaid Other | Attending: Family Medicine | Admitting: Family Medicine

## 2023-12-08 ENCOUNTER — Ambulatory Visit: Payer: Medicaid Other

## 2023-12-08 DIAGNOSIS — R051 Acute cough: Secondary | ICD-10-CM | POA: Diagnosis not present

## 2023-12-08 DIAGNOSIS — R062 Wheezing: Secondary | ICD-10-CM

## 2023-12-08 DIAGNOSIS — J4531 Mild persistent asthma with (acute) exacerbation: Secondary | ICD-10-CM

## 2023-12-08 MED ORDER — ALBUTEROL SULFATE (2.5 MG/3ML) 0.083% IN NEBU: 2.5000 mg | INHALATION_SOLUTION | Freq: Four times a day (QID) | RESPIRATORY_TRACT | 0 refills | Status: AC | PRN

## 2023-12-08 MED ORDER — PROMETHAZINE-DM 6.25-15 MG/5ML PO SYRP: 5.0000 mL | ORAL_SOLUTION | Freq: Four times a day (QID) | ORAL | 0 refills | Status: DC | PRN

## 2023-12-08 MED ORDER — PREDNISONE 20 MG PO TABS: 40.0000 mg | ORAL_TABLET | Freq: Every day | ORAL | 0 refills | Status: AC

## 2023-12-08 MED ORDER — IPRATROPIUM-ALBUTEROL 0.5-2.5 (3) MG/3ML IN SOLN
3.0000 mL | Freq: Once | RESPIRATORY_TRACT | Status: AC
  Administered 2023-12-08: 3 mL via RESPIRATORY_TRACT

## 2023-12-08 NOTE — ED Triage Notes (Signed)
Pt presents to UC for c/o body aches, wheezing, and productive cough worsened about 4 days ago. Pt was given abx 5 days ago for symptoms but they have not helped. Albuterol inhaler has not helped.

## 2023-12-08 NOTE — Discharge Instructions (Signed)
I have refilled your albuterol nebulizer solution.  Continue your inhalers as needed.  You may take Promethazine DM as needed for cough.  Please note this medication can make you drowsy.  Do not drink alcohol or drive while on this medication.  Start prednisone daily for 5 days.  Please follow-up with your PCP in 1 to 2 days for recheck.  Please go to the ER ASAP if you develop any worsening symptoms.  Hope you feel better soon!

## 2023-12-08 NOTE — ED Provider Notes (Signed)
[facility]    CSN: [id number] Arrival date & time: 12/08/23  1014      History   Chief Complaint Chief Complaint  Patient presents with   Wheezing    HPI Jade Lopez is a 28 y.o. female  presents for evaluation of URI symptoms for 3 weeks. Patient reports associated symptoms of cough, congestion, wheezing/shortness of breath. Denies N/V/D, fevers, sore throat, ear pain. Patient does have a hx of asthma.  Takes her Symbicort daily and uses albuterol as needed.  Reports no improvement with as needed albuterol.  Has home nebulizer but is out of her solution.  Patient is not an active smoker.   Reports no sick contacts.  Patient was seen in the emergency room on 1/22 for cough, chest pain, shortness of breath.   Pt has no other concerns at this time.  She had a negative chest x-ray and a negative CT chest.  White blood cells were slightly elevated at 15 she was thought to be related to her steroid use.  She was advised to follow-up with rheumatology.  She did see her PCP on 1/24 had a repeat CBC with normal white blood cells.  Sed rate was slightly up at 70.  She was started on amoxicillin twice daily for 7 days.  Patient reports no improvement on the antibiotic.  No other concerns at this time.  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   Chronic post-traumatic headache, not intractable 10/28/2023   Hypersomnia with sleep apnea 10/28/2023   Visual changes 10/28/2023   Abnormal laboratory test  result 07/29/2023   Traumatic brain injury (HCC) 07/14/2023   History of nausea and vomiting 05/19/2023   Fibromyalgia 04/11/2023   Abscess of right breast 04/03/2023   Abdominal wall pain 03/01/2023   Sleep related headaches 02/24/2023   OSA on CPAP 02/24/2023   Sleeps in sitting position due to orthopnea 02/24/2023   Menorrhagia 01/28/2023   Healthcare maintenance 01/28/2023   Chest pain 01/28/2023   Back pain 01/28/2023   Type 2 diabetes mellitus (HCC) 11/28/2022   History of systemic lupus erythematosus (HCC) 11/28/2022   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/28/2022   Endometriosis 11/28/2022   Anxiety 11/28/2022   Vitamin D deficiency 09/13/2012   Asthma 08/22/2012    Past Surgical History:  Procedure Laterality Date   BREAST LUMPECTOMY Right 2024   CHOLECYSTECTOMY     DILATATION AND CURETTAGE/HYSTEROSCOPY WITH MINERVA     3 total   INCISION AND DRAINAGE ABSCESS  2021   buttocks   IR LUMBAR PUNCTURE  11/15/2023   RADIOLOGY WITH ANESTHESIA N/A 11/15/2023   Procedure: Lumbar Puncture;  Surgeon: Sterling Big, MD;  Location: [facility] OR;  Service: Radiology;  Laterality: N/A;   TONSILLECTOMY AND ADENOIDECTOMY      OB History     Gravida  0   Para  0   Term  0   Preterm  0   AB  0   Living  0      SAB  0   IAB  0   Ectopic  0   Multiple  0   Live Births  0            Home Medications    Prior to Admission medications   Medication Sig Start Date End Date Taking? Authorizing Provider  albuterol (PROVENTIL) (2.5 MG/3ML) 0.083% nebulizer solution Take 3 mLs (2.5 mg total) by nebulization every 6 (six) hours as needed for wheezing or shortness of breath. 12/08/23  Yes [name], NP  amoxicillin (AMOXIL) 875 MG tablet Take by mouth. 12/03/23 12/10/23 Yes [provider]  predniSONE (DELTASONE) 20 MG tablet Take 2 tablets (40 mg total) by mouth daily with breakfast for 5 days. 12/08/23 12/13/23 Yes [name], NP  promethazine-dextromethorphan  (PROMETHAZINE-DM) 6.25-15 MG/5ML syrup Take 5 mLs by mouth 4 (four) times daily as needed for cough. 12/08/23  Yes [name], NP  Accu-Chek Softclix Lancets lancets daily. 12/15/22   [provider]  acetaminophen (TYLENOL) 500 MG tablet Take 1,000 mg by mouth 2 (two) times daily.    [provider]  acetaZOLAMIDE (DIAMOX) 250 MG tablet Take 1 tablet (250 mg total) by mouth 2 (two) times daily. 11/15/23   Dohmeier, Porfirio Mylar, MD  albuterol (VENTOLIN HFA) 108 (90 Base) MCG/ACT inhaler Inhale 1-2 puffs into the lungs every 6 (six) hours as needed for wheezing or shortness of breath. 05/10/23   Marinda Elk, MD  BIOTIN PO Take 2 capsules by mouth daily. Hair, skin nails    [provider]  Blood Glucose Monitoring Suppl (MM EASY TOUCH GLUCOSE METER) w/Device KIT Please check your blood sugar daily, in the morning. 12/08/22   Marolyn Haller, MD  Blood Glucose Monitoring Suppl DEVI 1 each by Does not apply route 3 (three) times daily. May dispense any manufacturer covered by patient's insurance. 05/10/23   Marinda Elk, MD  budesonide-formoterol (SYMBICORT) 80-4.5 MCG/ACT inhaler Inhale 2 puffs into the lungs 2 (two) times daily as needed. Patient taking differently: Inhale 2 puffs into the lungs 2 (two) times daily. 05/19/23   Kathleen Lime, MD  busPIRone (BUSPAR) 10 MG tablet Take 3 tablets (30 mg total) by mouth daily. Patient not taking: Reported on 11/11/2023 11/25/22   Marolyn Haller, MD  cyclobenzaprine (FLEXERIL) 10 MG tablet Take 1 tablet (10 mg total) by mouth 3 (three) times daily as needed for muscle spasms. 11/23/23   Rice, Jamesetta Orleans, MD  Glucose Blood (BLOOD GLUCOSE TEST STRIPS) STRP 1 each by Does not apply route 3 (three) times daily. Use as directed to check blood sugar. May dispense any manufacturer covered by patient's insurance and fits patient's device. 05/10/23   Marinda Elk, MD  HYDROcodone-acetaminophen (NORCO/VICODIN) 5-325 MG tablet  Take 1 tablet by mouth every 6 (six) hours as needed. 12/02/23   Darrick Grinder, PA-C  Lancet Device MISC 1 each by Does not apply route 3 (three) times daily. May dispense any manufacturer covered by patient's insurance. 05/10/23   Marinda Elk, MD  Lancets MISC 1 each by Does not apply route 3 (three) times daily. Use as directed to check blood sugar. May dispense any manufacturer covered by patient's insurance and fits patient's device. 05/10/23   Marinda Elk, MD  medroxyPROGESTERone (DEPO-PROVERA) 150 MG/ML injection Inject 150 mg into the muscle every 3 (three) months.  [provider]  MOUNJARO 7.5 MG/0.5ML Pen Inject into the skin. 09/30/23   [provider]  Multiple Vitamins-Minerals (ZINC PO) Take 1 tablet by mouth daily.    [provider]  naproxen (NAPROSYN) 500 MG tablet Take 1 tablet (500 mg total) by mouth 2 (two) times daily with a meal. 10/31/23   Wallis Bamberg, PA-C  norethindrone (AYGESTIN) 5 MG tablet Take 10 mg by mouth at bedtime. 12/11/22   [provider]  ondansetron (ZOFRAN-ODT) 8 MG disintegrating tablet Take 1 tablet (8 mg total) by mouth every 8 (eight) hours as needed for nausea or vomiting. 05/17/23   [name], NP  pregabalin (LYRICA) 50 MG capsule Take 1 capsule (50 mg total) by mouth 3 (three) times daily. Patient not taking: Reported on 11/11/2023 05/19/23   Kathleen Lime, MD  topiramate (TOPAMAX) 25 MG tablet Take 1 tablet (25 mg total) by mouth 2 (two) times daily. 11/08/23   Dohmeier, Porfirio Mylar, MD  traZODone (DESYREL) 300 MG tablet Take 1 tablet (300 mg total) by mouth at bedtime. 09/22/23   Richardean Sale, DO  triamcinolone ointment (KENALOG) 0.1 % Apply 1 Application topically daily at 12 noon.    [provider]  Turmeric, Curcuma [place]a, (TURMERIC ROOT) POWD Take 15 mLs by mouth daily. Put in food    [provider]    Family History Family History  Problem Relation Age of Onset    Diabetes Mother    Hypertension Mother    Migraines Mother    Diabetes Father    Hypertension Father    Sleep apnea Father     Social History Social History   Tobacco Use   Smoking status: Never    Passive exposure: Never   Smokeless tobacco: Never  Vaping Use   Vaping status: Never Used  Substance Use Topics   Alcohol use: Yes    Comment: occasional   Drug use: No     Allergies   Clindamycin/lincomycin, Gabapentin, Other, Pregabalin, Cephalexin, Iodinated contrast media, Meloxicam, and Clindamycin   Review of Systems Review of Systems  HENT:  Positive for congestion.   Respiratory:  Positive for cough and wheezing.      Physical Exam Triage Vital Signs ED Triage Vitals  Encounter Vitals Group     BP 12/08/23 1137 126/83     Systolic BP Percentile --      Diastolic BP Percentile --      Pulse Rate 12/08/23 1137 (!) 114     Resp 12/08/23 1137 20     Temp 12/08/23 1137 98 F (36.7 C)     Temp Source 12/08/23 1137 Oral     SpO2 12/08/23 1137 97 %     Weight --      Height --      Head Circumference --      Peak Flow --      Pain Score 12/08/23 1133 0     Pain Loc --      Pain Education --      Exclude from Growth Chart --    No data found.  Updated Vital Signs BP 126/83 (BP Location: Left Wrist)   Pulse (!) 114   Temp 98 F (36.7 C) (Oral)   Resp 20   LMP  (LMP Unknown)   SpO2 97%   Visual Acuity Right Eye Distance:   Left Eye Distance:   Bilateral Distance:    Right Eye Near:   Left Eye Near:    Bilateral  Near:     Physical Exam Vitals and nursing note reviewed.  Constitutional:      General: She is not in acute distress.    Appearance: She is well-developed. She is not ill-appearing.  HENT:     Head: Normocephalic and atraumatic.     Right Ear: Tympanic membrane and ear canal normal.     Left Ear: Tympanic membrane and ear canal normal.     Nose: Congestion present.     Mouth/Throat:     Mouth: Mucous membranes are moist.      Pharynx: Oropharynx is clear. Uvula midline. No posterior oropharyngeal erythema.     Tonsils: No tonsillar exudate or tonsillar abscesses.  Eyes:     Conjunctiva/sclera: Conjunctivae normal.     Pupils: Pupils are equal, round, and reactive to light.  Cardiovascular:     Rate and Rhythm: Regular rhythm. Tachycardia present.     Heart sounds: Normal heart sounds.     Comments: Tachycardia 114, appears baseline per patient history Pulmonary:     Effort: Pulmonary effort is normal.     Breath sounds: Wheezing present.     Comments: Very slight wheeze bilateral bases Musculoskeletal:     Cervical back: Normal range of motion and neck supple.  Lymphadenopathy:     Cervical: No cervical adenopathy.  Skin:    General: Skin is warm and dry.  Neurological:     General: No focal deficit present.     Mental Status: She is alert and oriented to person, place, and time.  Psychiatric:        Mood and Affect: Mood normal.        Behavior: Behavior normal.      UC Treatments / Results  Labs (all labs ordered are listed, but only abnormal results are displayed) Labs Reviewed - No data to display  EKG   Radiology No results found.  Procedures Procedures (including critical care time)  Medications Ordered in UC Medications  ipratropium-albuterol (DUONEB) 0.5-2.5 (3) MG/3ML nebulizer solution 3 mL (3 mLs Nebulization Given 12/08/23 1227)    Initial Impression / Assessment and Plan / UC Course  I have reviewed the triage vital signs and the nursing notes.  Pertinent labs & imaging results that were available during my care of the patient were reviewed by me and considered in my medical decision making (see chart for details).     Reviewed exam and symptoms with patient.  No red flags.  Patient had a negative CT angio chest as well as chest x-ray within the past several days, do not feel repeat chest x-ray is needed at this time.  Wheezing resolved after nebulizer but patient states  she still feels wheezy.  States when she was at the ER a few days ago they had wanted to admit her but for unclear reasons did not.  Asked patient if she felt like her symptoms were progressing to the point where she may need admission for her asthma management but she declines at this time.  I did refill her albuterol nebulizer and will start prednisone daily for 5 days.  Promethazine DM as needed for cough, side effect profile reviewed.  Advised that she see her PCP in 1 to 2 days for recheck.  Strict ER precautions reviewed and patient verbalized understanding. Final Clinical Impressions(s) / UC Diagnoses   Final diagnoses:  Acute cough  Wheezing  Mild persistent asthma with acute exacerbation     Discharge Instructions      I  have refilled your albuterol nebulizer solution.  Continue your inhalers as needed.  You may take Promethazine DM as needed for cough.  Please note this medication can make you drowsy.  Do not drink alcohol or drive while on this medication.  Start prednisone daily for 5 days.  Please follow-up with your PCP in 1 to 2 days for recheck.  Please go to the ER ASAP if you develop any worsening symptoms.  Hope you feel better soon!     ED Prescriptions     Medication Sig Dispense Auth. Provider   albuterol (PROVENTIL) (2.5 MG/3ML) 0.083% nebulizer solution Take 3 mLs (2.5 mg total) by nebulization every 6 (six) hours as needed for wheezing or shortness of breath. 75 mL [name], NP   predniSONE (DELTASONE) 20 MG tablet Take 2 tablets (40 mg total) by mouth daily with breakfast for 5 days. 10 tablet [name], NP   promethazine-dextromethorphan (PROMETHAZINE-DM) 6.25-15 MG/5ML syrup Take 5 mLs by mouth 4 (four) times daily as needed for cough. 118 mL [name], NP      PDMP not reviewed this encounter.   [name], NP 12/08/23 1259

## 2023-12-20 ENCOUNTER — Encounter: Payer: Medicaid Other | Admitting: Student

## 2023-12-30 NOTE — Progress Notes (Signed)
 Office Visit Note  Patient: Jade Lopez             Date of Birth: 05/24/1996           MRN: [id number]             PCP: Chyrel Masson Referring: Thomasene Ripple* Visit Date: 01/13/2024   Subjective:  Follow-up (Patient states she has random face swelling. Patient states she has random fevers. )    Discussed the use of AI scribe software for clinical note transcription with the patient, who gave verbal consent to proceed.  History of Present Illness   Jade Lopez is a 28 y.o. female here for follow up with chronic pain in multiple areas multifactorial with FMS, thoracic spine osteoarthitis, and history of lupus recently seronegative for inflammatory arthritis.   She experiences chronic joint pain primarily in her knees, shoulders, and back, with recent flare-ups increasing in frequency and severity. She has a history of a back fracture, which is currently stable. No new falls or injuries have occurred. No new skin rashes or changes, and no significant back pain beyond her usual discomfort.  She took hydroxychloroquine for several months without significant improvement. She is prescribed Lyrica at 50 mg three times a day, which she has been on for about a year. She also uses naproxen and Tylenol as needed for pain relief. Cyclobenzaprine is taken occasionally but is not sure about use along with lyrica.  She experiences facial swelling and occasional sores in her mouth, specifically on the inner side of her jaw, which are sometimes painful. She uses salt water rinses to manage these sores. Swelling is noted in her face but not more than usual in her legs.  She recently felt unwell with wheezing, for which she visited her primary care provider. X-rays were normal, but she continued to feel unwell. Prednisone was given both in the emergency room and by her primary care provider, but she did not experience significant relief. She eventually started to feel better over  time. No new respiratory infections.    Previous HPI 10/25/2023 Jade Lopez is a 28 y.o. female here for follow up with history of lupus. They have completed a course of antibiotics for the breast infection. They were started on hydroxychloroquine 400 mg daily at the last visit to manage the joint inflammation. They report a slight improvement in symptoms, particularly in the left leg, which is now pain-free. However, they also note an increase in shoulder pain. They have experienced some nausea but are unsure if it is related to the medication. They deny any new illnesses or rashes, but mention a small light spot on the left thigh.   The patient also reports persistent pain in both shoulders, which has recently increased. The pain radiates down the arm but does not cause numbness or affect grip strength. They also report occasional itching and burning in the ears.   Previous HPI 08/30/2023 Jade Lopez is a 28 y.o. female here for follow up and with history of lupus.  Workup at our initial visit was negative for any disease specific antibodies but did have moderately elevated sedimentation rate and CRP.  Still having a lot of body pains and stiffness not localized very well to specific joints.  Still seeing leg swelling worse in the evening.  Worsening of the right breast cysts not diagnosed with abscess for which it sounds like she is going to need some drainage or debridement.   Previous HPI 07/29/23 Jade Lopez is a 28 y.o. female here for evaluation and management with history of lupus.  She was not seeing any rheumatologist previously had been managed through primary care office with prolonged oral prednisone treatment.  From her history sounds like original diagnosis was based on joint inflammation and abnormal serology.  And more recently reviewed notes may have also experienced a course of intermittent fevers without clear underlying cause.  Also previous diagnosis of fibromyalgia  syndrome and takes Lyrica 50 mg 3 times daily.  Currently has joint pain in multiple areas mostly worse on her right side throughout upper extremity and in the right knee.  She previously fractured the right ankle does not have as much pain in the site as an others.  She gets swelling in multiple areas legs are worse by the evening time. Besides joint pain multiple areas she has some frequent or recurrent skin lesions sounds like cysts or small abscesses in the axilla groin and breast areas.  Saw dermatology evaluation at some point and found to have some type of dermatitis but was not diagnosed as hidradenitis. Has chronic eye and mouth dryness not associated with dental complications vision change or eye inflammation.  Reports some hair loss or thinning not associated with any scalp rash or specific distribution. She does have chronic fatigue, concentration difficulty, dizziness, headaches, chronic diarrhea.     12/2022 ANA neg dsDNA neg   Review of Systems  Constitutional:  Positive for fatigue.  HENT:  Positive for mouth dryness. Negative for mouth sores.   Eyes:  Positive for dryness.  Respiratory:  Negative for shortness of breath.   Cardiovascular:  Negative for chest pain and palpitations.  Gastrointestinal:  Negative for blood in stool, constipation and diarrhea.  Endocrine: Negative for increased urination.  Genitourinary:  Negative for involuntary urination.  Musculoskeletal:  Positive for joint pain, joint pain, joint swelling, myalgias, morning stiffness, muscle tenderness and myalgias. Negative for gait problem and muscle weakness.  Skin:  Positive for sensitivity to sunlight. Negative for color change, rash and hair loss.  Allergic/Immunologic: Positive for susceptible to infections.  Neurological:  Positive for dizziness and headaches.  Hematological:  Negative for swollen glands.  Psychiatric/Behavioral:  Positive for sleep disturbance. Negative for depressed mood. The  patient is nervous/anxious.     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   Chronic post-traumatic headache, not intractable 10/28/2023   Hypersomnia with sleep apnea 10/28/2023   Visual changes 10/28/2023   Abnormal laboratory test result 07/29/2023   Traumatic brain injury (HCC) 07/14/2023   History of nausea and vomiting 05/19/2023   Fibromyalgia 04/11/2023   Abscess of right breast 04/03/2023   Abdominal wall pain 03/01/2023   Sleep related headaches 02/24/2023   OSA on CPAP 02/24/2023   Sleeps in sitting position due to orthopnea 02/24/2023   Menorrhagia 01/28/2023   Healthcare maintenance 01/28/2023   Chest pain 01/28/2023   Back pain 01/28/2023   Type 2 diabetes mellitus (HCC) 11/28/2022   History of systemic lupus erythematosus (HCC) 11/28/2022   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/28/2022   Endometriosis 11/28/2022   Anxiety 11/28/2022   Vitamin D deficiency 09/13/2012   Asthma 08/22/2012    Past Medical History:  Diagnosis Date   Asthma    Autoimmune disease (HCC)    lupus   Blood clots in brain 2020   Class 3 obesity    Endometriosis  Endometritis    Fibromyalgia    Near syncope 05/09/2023   Sleep apnea    Sleep related headaches 02/24/2023   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   T2DM (type 2 diabetes mellitus) (HCC)    TOA (tubo-ovarian abscess)     Family History  Problem Relation Age of Onset   Diabetes Mother    Hypertension Mother    Migraines Mother    Diabetes Father    Hypertension Father    Sleep apnea Father    Past Surgical History:  Procedure Laterality Date   BREAST LUMPECTOMY Right 2024   CHOLECYSTECTOMY     DILATATION AND CURETTAGE/HYSTEROSCOPY WITH MINERVA     3 total   INCISION AND DRAINAGE ABSCESS  2021   buttocks   IR LUMBAR PUNCTURE  11/15/2023   RADIOLOGY WITH ANESTHESIA N/A 11/15/2023   Procedure: Lumbar Puncture;  Surgeon: Sterling Big, MD;   Location: [facility] OR;  Service: Radiology;  Laterality: N/A;   TONSILLECTOMY AND ADENOIDECTOMY     Social History   Social History Narrative   Pt lives alone    Pt not working    Immunization History  Administered Date(s) Administered   Influenza, Seasonal, Injecte, Preservative Fre 08/08/2008   PPD Test 10/07/2022   Td 07/17/2015     Objective: Vital Signs: BP 131/84 (BP Location: Left Arm, Patient Position: Sitting, Cuff Size: Normal)   Pulse (!) 106   Resp 16   Ht 5' 4.5" (1.638 m)   Wt (!) 420 lb (190.5 kg)   BMI 70.98 kg/m    Physical Exam Constitutional:      Appearance: She is obese.  HENT:     Mouth/Throat:     Mouth: Mucous membranes are moist.     Pharynx: Oropharynx is clear.  Eyes:     Conjunctiva/sclera: Conjunctivae normal.  Cardiovascular:     Rate and Rhythm: Normal rate and regular rhythm.  Pulmonary:     Effort: Pulmonary effort is normal.     Breath sounds: Normal breath sounds.  Musculoskeletal:     Right lower leg: No edema.     Left lower leg: No edema.  Lymphadenopathy:     Cervical: No cervical adenopathy.  Skin:    General: Skin is warm and dry.  Neurological:     Mental Status: She is alert.  Psychiatric:        Mood and Affect: Mood normal.      Musculoskeletal Exam:  Shoulders full ROM some pain with movement but intact, no focal tenderness Elbows full ROM no tenderness or swelling Wrists full ROM no tenderness or swelling Fingers full ROM no tenderness or swelling Mid back and low back tenderness to pressure, worst midline and right side Knees full ROM no tenderness or swelling, right knee crepitus   Investigation: No additional findings.  Imaging: No results found.   Recent Labs: Lab Results  Component Value Date   WBC 15.5 (H) 12/01/2023   HGB 13.7 12/01/2023   PLT 310 12/01/2023   NA 138 12/01/2023   K 4.3 12/01/2023   CL 104 12/01/2023   CO2 21 (L) 12/01/2023   GLUCOSE 204 (H) 12/01/2023   BUN 13 12/01/2023    CREATININE 0.82 12/01/2023   BILITOT 0.4 05/09/2023   ALKPHOS 92 05/09/2023   AST 11 (L) 05/09/2023   ALT 16 05/09/2023   PROT 7.1 05/09/2023   ALBUMIN 3.3 (L) 05/09/2023   CALCIUM 9.5 12/01/2023    Speciality Comments: No specialty comments available.  Procedures:  No procedures performed Allergies: Clindamycin/lincomycin, Gabapentin, Other, Pregabalin, Cephalexin, Iodinated contrast media, Meloxicam, and Clindamycin   Assessment / Plan:     Visit Diagnoses: Seronegative rheumatoid arthritis (HCC) - Plan: Rheumatoid factor, Sedimentation rate, C-reactive protein Autoimmune disease history which could be contributory and persistent elevated sed rate but no peripheral synovitis or mucocutaneous involvement noted. Facial swelling and oral sores possibly related to underlying condition but nothing is present.  Previous sed rate was even higher in January but may have had respiratory illness at that time also had issues with the abscess.  No appreciable response to hydroxychloroquine so would have to consider more aggressive DMARD versus symptomatic management. - Recheck inflammatory markers and rheumatoid factor serology -Cannot confirm clinically significant disease activity plan to continue symptomatic treatment chronic musculoskeletal pain and fibromyalgia  High risk medication use - Plan: CBC with Differential/Platelet, COMPLETE METABOLIC PANEL WITH GFR Checking CBC and CMP considering trial of alternate medications or for monitoring on long-term use of naproxen with recent abnormal labs. History without strong contraindication for MT X/SSZ/AZA.  Fibromyalgia - Plan: cyclobenzaprine (FLEXERIL) 10 MG tablet Chronic joint pain in knees, shoulders, and back with recent flare-ups. Lyrica may benefit with dose titration versus addition of other medications.  - Will communicate with internal medicine provider, consider dose adjustment Lyrica is currently quite low for long term FMS dosing -  Recommend cyclobenzaprine at night for back pain is okay to combine with other medicines  Respiratory symptoms Respiratory symptoms improved without intervention. Previous x-rays normal. - Monitor respiratory symptoms and consider further evaluation if symptoms worsen.     Orders: Orders Placed This Encounter  Procedures   Rheumatoid factor   Sedimentation rate   C-reactive protein   CBC with Differential/Platelet   COMPLETE METABOLIC PANEL WITH GFR   Meds ordered this encounter  Medications   cyclobenzaprine (FLEXERIL) 10 MG tablet    Sig: Take 1 tablet (10 mg total) by mouth at bedtime.    Dispense:  34 tablet    Refill:  0     Follow-Up Instructions: Return in about 3 months (around 04/14/2024) for ?RA/FMS f/u 3mos.   Fuller Plan, MD  Note - This record has been created using [employer].  Chart creation errors have been sought, but may not always  have been located. Such creation errors do not reflect on  the standard of medical care.

## 2024-01-01 ENCOUNTER — Emergency Department (HOSPITAL_COMMUNITY)
Admission: EM | Admit: 2024-01-01 | Discharge: 2024-01-01 | Disposition: A | Discharge status: Home / Self Care | Payer: Medicaid Other | Attending: Emergency Medicine | Admitting: Emergency Medicine

## 2024-01-01 ENCOUNTER — Encounter (HOSPITAL_COMMUNITY): Payer: Self-pay | Admitting: Emergency Medicine

## 2024-01-01 ENCOUNTER — Other Ambulatory Visit: Payer: Self-pay

## 2024-01-01 DIAGNOSIS — G43809 Other migraine, not intractable, without status migrainosus: Secondary | ICD-10-CM | POA: Diagnosis present

## 2024-01-01 MED ORDER — KETOROLAC TROMETHAMINE 15 MG/ML IJ SOLN
15.0000 mg | Freq: Once | INTRAMUSCULAR | Status: AC
Start: 2024-01-01 — End: 2024-01-01
  Administered 2024-01-01: 15 mg via INTRAVENOUS
  Filled 2024-01-01: qty 1

## 2024-01-01 MED ORDER — SODIUM CHLORIDE 0.9 % IV BOLUS
1000.0000 mL | Freq: Once | INTRAVENOUS | Status: AC
  Administered 2024-01-01: 1000 mL via INTRAVENOUS

## 2024-01-01 MED ORDER — PROCHLORPERAZINE EDISYLATE 10 MG/2ML IJ SOLN
10.0000 mg | Freq: Once | INTRAMUSCULAR | Status: AC
  Administered 2024-01-01: 10 mg via INTRAVENOUS
  Filled 2024-01-01: qty 2

## 2024-01-01 MED ORDER — DIPHENHYDRAMINE HCL 50 MG/ML IJ SOLN
25.0000 mg | Freq: Once | INTRAMUSCULAR | Status: AC
  Administered 2024-01-01: 25 mg via INTRAVENOUS
  Filled 2024-01-01: qty 1

## 2024-01-01 NOTE — Discharge Instructions (Signed)
 I would like for you to follow-up with your primary care doctor in 1 week to ensure we have good follow-up.  You can also follow-up with your neurologist if you have one.  We need to get you back on medication regimen for your migraines.

## 2024-01-01 NOTE — ED Provider Notes (Signed)
 [place] EMERGENCY DEPARTMENT AT [facility] Provider Note   CSN: [id number] Arrival date & time: 01/01/24  1250     History Chief Complaint  Patient presents with   Migraine    Jade Lopez is a 28 y.o. female patient with history of migraines who presents the emergency department with a headache that started yesterday.  She states this is very typical for her migraines.  She describes associated dizziness and photophobia.  She also describes nausea.  She states these are all typical symptoms for her migraines.  There is nothing new about this migraine in comparison to her previous ones.  She denies any focal weakness or numbness. She does not have any abortive medications to take at home.    Migraine       Home Medications Prior to Admission medications   Medication Sig Start Date End Date Taking? Authorizing Provider  Accu-Chek Softclix Lancets lancets daily. 12/15/22   [provider]  acetaminophen (TYLENOL) 500 MG tablet Take 1,000 mg by mouth 2 (two) times daily.    [provider]  acetaZOLAMIDE (DIAMOX) 250 MG tablet Take 1 tablet (250 mg total) by mouth 2 (two) times daily. 11/15/23   Dohmeier, Porfirio Mylar, MD  albuterol (PROVENTIL) (2.5 MG/3ML) 0.083% nebulizer solution Take 3 mLs (2.5 mg total) by nebulization every 6 (six) hours as needed for wheezing or shortness of breath. 12/08/23   [name], NP  albuterol (VENTOLIN HFA) 108 (90 Base) MCG/ACT inhaler Inhale 1-2 puffs into the lungs every 6 (six) hours as needed for wheezing or shortness of breath. 05/10/23   Marinda Elk, MD  BIOTIN PO Take 2 capsules by mouth daily. Hair, skin nails    [provider]  Blood Glucose Monitoring Suppl (MM EASY TOUCH GLUCOSE METER) w/Device KIT Please check your blood sugar daily, in the morning. 12/08/22   Marolyn Haller, MD  Blood Glucose Monitoring Suppl DEVI 1 each by Does not apply route 3 (three) times daily. May dispense any  manufacturer covered by patient's insurance. 05/10/23   Marinda Elk, MD  budesonide-formoterol (SYMBICORT) 80-4.5 MCG/ACT inhaler Inhale 2 puffs into the lungs 2 (two) times daily as needed. Patient taking differently: Inhale 2 puffs into the lungs 2 (two) times daily. 05/19/23   Kathleen Lime, MD  busPIRone (BUSPAR) 10 MG tablet Take 3 tablets (30 mg total) by mouth daily. Patient not taking: Reported on 11/11/2023 11/25/22   Marolyn Haller, MD  cyclobenzaprine (FLEXERIL) 10 MG tablet Take 1 tablet (10 mg total) by mouth 3 (three) times daily as needed for muscle spasms. 11/23/23   Rice, Jamesetta Orleans, MD  Glucose Blood (BLOOD GLUCOSE TEST STRIPS) STRP 1 each by Does not apply route 3 (three) times daily. Use as directed to check blood sugar. May dispense any manufacturer covered by patient's insurance and fits patient's device. 05/10/23   Marinda Elk, MD  HYDROcodone-acetaminophen (NORCO/VICODIN) 5-325 MG tablet Take 1 tablet by mouth every 6 (six) hours as needed. 12/02/23   Darrick Grinder, PA-C  Lancet Device MISC 1 each by Does not apply route 3 (three) times daily. May dispense any manufacturer covered by patient's insurance. 05/10/23   Marinda Elk, MD  Lancets MISC 1 each by Does not apply route 3 (three) times daily. Use as directed to check blood sugar. May dispense any manufacturer covered by patient's insurance and fits patient's device. 05/10/23   Marinda Elk, MD  medroxyPROGESTERone (DEPO-PROVERA) 150 MG/ML injection Inject 150 mg  into the muscle every 3 (three) months.    [provider]  MOUNJARO 7.5 MG/0.5ML Pen Inject into the skin. 09/30/23   [provider]  Multiple Vitamins-Minerals (ZINC PO) Take 1 tablet by mouth daily.    [provider]  naproxen (NAPROSYN) 500 MG tablet Take 1 tablet (500 mg total) by mouth 2 (two) times daily with a meal. 10/31/23   Wallis Bamberg, PA-C  norethindrone (AYGESTIN) 5 MG tablet Take 10 mg by  mouth at bedtime. 12/11/22   [provider]  ondansetron (ZOFRAN-ODT) 8 MG disintegrating tablet Take 1 tablet (8 mg total) by mouth every 8 (eight) hours as needed for nausea or vomiting. 05/17/23   [name], NP  pregabalin (LYRICA) 50 MG capsule Take 1 capsule (50 mg total) by mouth 3 (three) times daily. Patient not taking: Reported on 11/11/2023 05/19/23   Kathleen Lime, MD  promethazine-dextromethorphan (PROMETHAZINE-DM) 6.25-15 MG/5ML syrup Take 5 mLs by mouth 4 (four) times daily as needed for cough. 12/08/23   [name], NP  topiramate (TOPAMAX) 25 MG tablet Take 1 tablet (25 mg total) by mouth 2 (two) times daily. 11/08/23   Dohmeier, Porfirio Mylar, MD  traZODone (DESYREL) 300 MG tablet Take 1 tablet (300 mg total) by mouth at bedtime. 09/22/23   Richardean Sale, DO  triamcinolone ointment (KENALOG) 0.1 % Apply 1 Application topically daily at 12 noon.    [provider]  Turmeric, Curcuma [place], (TURMERIC ROOT) POWD Take 15 mLs by mouth daily. Put in food    [provider]      Allergies    Clindamycin/lincomycin, Gabapentin, Other, Pregabalin, Cephalexin, Iodinated contrast media, Meloxicam, and Clindamycin    Review of Systems   Review of Systems  All other systems reviewed and are negative.   Physical Exam Updated Vital Signs BP (!) 144/106 (BP Location: Right Arm)   Pulse (!) 116   Temp 98.3 F (36.8 C) (Oral)   Resp 18   Ht 5\' 4"  (1.626 m)   Wt (!) 181 kg   SpO2 99%   BMI 68.49 kg/m  Physical Exam Vitals and nursing note reviewed.  Constitutional:      Appearance: Normal appearance.  HENT:     Head: Normocephalic and atraumatic.  Eyes:     General:        Right eye: No discharge.        Left eye: No discharge.     Conjunctiva/sclera: Conjunctivae normal.  Pulmonary:     Effort: Pulmonary effort is normal.  Skin:    General: Skin is warm and dry.     Findings: No rash.  Neurological:     General: No focal deficit present.      Mental Status: She is alert.     Comments: Cranial nerves II to XII are intact.  5/5 strength to the upper and lower extremities.  Normal sensation to the upper and lower extremities.  No dysmetria with finger-nose.  There is no obvious nystagmus.  Speech is normal.  Obeys all commands appropriately.  Psychiatric:        Mood and Affect: Mood normal.        Behavior: Behavior normal.     ED Results / Procedures / Treatments   Labs (all labs ordered are listed, but only abnormal results are displayed) Labs Reviewed - No data to display  EKG None  Radiology No results found.  Procedures Procedures    Medications Ordered in ED Medications  sodium  chloride 0.9 % bolus 1,000 mL (1,000 mLs Intravenous New Bag/Given 01/01/24 1524)  diphenhydrAMINE (BENADRYL) injection 25 mg (25 mg Intravenous Given 01/01/24 1535)  ketorolac (TORADOL) 15 MG/ML injection 15 mg (15 mg Intravenous Given 01/01/24 1535)  prochlorperazine (COMPAZINE) injection 10 mg (10 mg Intravenous Given 01/01/24 1535)    ED Course/ Medical Decision Making/ A&P Clinical Course as of 01/01/24 1714  Sat Jan 01, 2024  1711 On repeat evaluation, patient is feeling much better after migraine cocktail.  She is ready to go home.  I think this is reasonable.  This seems to be typical for her migraines.  I will have her follow-up with her primary care doctor and/or her neurologist. Repeat neuro exam is normal.  [CF]    Clinical Course User Index [CF] Teressa Lower, PA-C   {   Click here for ABCD2, HEART and other calculators  Medical Decision Making Julie Nay is a 28 y.o. female patient who presents to the emergency department today for further evaluation of headache.  This seems to be a typical migraine for the patient.  She ran out of her migraine medications at home. Do not feel that imaging is warranted at this time.  Will plan to give her migraine cocktail and plan to reassess.  Patient is feeling better.  Will  continue to treat conservatively in the outpatient setting.  She is safe for discharge.  Strict return precautions were discussed.  Risk Prescription drug management.    Final Clinical Impression(s) / ED Diagnoses Final diagnoses:  Other migraine without status migrainosus, not intractable    Rx / DC Orders ED Discharge Orders     None         Teressa Lower, [place] 01/01/24 1714    Ernie Avena, MD 01/01/24 [id number]

## 2024-01-01 NOTE — ED Triage Notes (Signed)
 Pt to ER via EMS from home with c/o migraine since yesterday.  Pt reports headache is similar to her normal headaches.  States she ran out of her migraine medications.  Pt reports nausea, denies vomiting.

## 2024-01-06 NOTE — Progress Notes (Signed)
 Reviewed medication administration documentation

## 2024-01-13 ENCOUNTER — Encounter: Payer: Self-pay | Admitting: Internal Medicine

## 2024-01-13 ENCOUNTER — Ambulatory Visit: Payer: Medicaid Other | Attending: Internal Medicine | Admitting: Internal Medicine

## 2024-01-13 VITALS — BP 131/84 | HR 106 | Resp 16 | Ht 64.5 in | Wt >= 6400 oz

## 2024-01-13 DIAGNOSIS — N611 Abscess of the breast and nipple: Secondary | ICD-10-CM

## 2024-01-13 DIAGNOSIS — M0600 Rheumatoid arthritis without rheumatoid factor, unspecified site: Secondary | ICD-10-CM

## 2024-01-13 DIAGNOSIS — Z79899 Other long term (current) drug therapy: Secondary | ICD-10-CM | POA: Diagnosis not present

## 2024-01-13 DIAGNOSIS — M797 Fibromyalgia: Secondary | ICD-10-CM

## 2024-01-13 DIAGNOSIS — E559 Vitamin D deficiency, unspecified: Secondary | ICD-10-CM

## 2024-01-13 DIAGNOSIS — M329 Systemic lupus erythematosus, unspecified: Secondary | ICD-10-CM

## 2024-01-13 MED ORDER — CYCLOBENZAPRINE HCL 10 MG PO TABS: 10.0000 mg | ORAL_TABLET | Freq: Every day | ORAL | 0 refills | Status: DC

## 2024-01-13 NOTE — Patient Instructions (Signed)
 I recommend checking out the [place] [facility] [place] patient-centered guide for fibromyalgia and chronic pain management: [email or web address]

## 2024-01-14 LAB — COMPLETE METABOLIC PANEL WITH GFR
AG Ratio: 1.2 (calc) (ref 1.0–2.5)
ALT: 14 U/L (ref 6–29)
AST: 9 U/L — ABNORMAL LOW (ref 10–30)
Albumin: 3.5 g/dL — ABNORMAL LOW (ref 3.6–5.1)
Alkaline phosphatase (APISO): 104 U/L (ref 31–125)
BUN: 12 mg/dL (ref 7–25)
CO2: 27 mmol/L (ref 20–32)
Calcium: 9 mg/dL (ref 8.6–10.2)
Chloride: 104 mmol/L (ref 98–110)
Creat: 0.68 mg/dL (ref 0.50–0.96)
Globulin: 3 g/dL (ref 1.9–3.7)
Glucose, Bld: 166 mg/dL — ABNORMAL HIGH (ref 65–99)
Potassium: 4.4 mmol/L (ref 3.5–5.3)
Sodium: 139 mmol/L (ref 135–146)
Total Bilirubin: 0.3 mg/dL (ref 0.2–1.2)
Total Protein: 6.5 g/dL (ref 6.1–8.1)
eGFR: 122 mL/min/{1.73_m2} (ref >=60)

## 2024-01-14 LAB — CBC WITH DIFFERENTIAL/PLATELET
Absolute Lymphocytes: 2980 {cells}/uL (ref 850–3900)
Absolute Monocytes: 526 {cells}/uL (ref 200–950)
Basophils Absolute: 28 {cells}/uL (ref 0–200)
Basophils Relative: 0.3 %
Eosinophils Absolute: 432 {cells}/uL (ref 15–500)
Eosinophils Relative: 4.6 %
HCT: 39.6 % (ref 35.0–45.0)
Hemoglobin: 12.6 g/dL (ref 11.7–15.5)
MCH: 26.5 pg — ABNORMAL LOW (ref 27.0–33.0)
MCHC: 31.8 g/dL — ABNORMAL LOW (ref 32.0–36.0)
MCV: 83.4 fL (ref 80.0–100.0)
MPV: 10 fL (ref 7.5–12.5)
Monocytes Relative: 5.6 %
Neutro Abs: 5433 {cells}/uL (ref 1500–7800)
Neutrophils Relative %: 57.8 %
Platelets: 270 10*3/uL (ref 140–400)
RBC: 4.75 10*6/uL (ref 3.80–5.10)
RDW: 13.5 % (ref 11.0–15.0)
Total Lymphocyte: 31.7 %
WBC: 9.4 10*3/uL (ref 3.8–10.8)

## 2024-01-14 LAB — SEDIMENTATION RATE: Sed Rate: 65 mm/h — ABNORMAL HIGH (ref 0–20)

## 2024-01-14 LAB — RHEUMATOID FACTOR: Rheumatoid fact SerPl-aCnc: <10 [IU]/mL (ref <14)

## 2024-01-14 LAB — C-REACTIVE PROTEIN: CRP: 36.6 mg/L — ABNORMAL HIGH (ref <8.0)

## 2024-01-20 ENCOUNTER — Ambulatory Visit
Admission: EM | Admit: 2024-01-20 | Discharge: 2024-01-20 | Disposition: A | Discharge status: Home / Self Care | Attending: Emergency Medicine | Admitting: Emergency Medicine

## 2024-01-20 DIAGNOSIS — M546 Pain in thoracic spine: Secondary | ICD-10-CM | POA: Diagnosis not present

## 2024-01-20 DIAGNOSIS — G8929 Other chronic pain: Secondary | ICD-10-CM

## 2024-01-20 MED ORDER — KETOROLAC TROMETHAMINE 30 MG/ML IJ SOLN
60.0000 mg | Freq: Once | INTRAMUSCULAR | Status: AC
  Administered 2024-01-20: 60 mg via INTRAMUSCULAR

## 2024-01-20 NOTE — ED Provider Notes (Signed)
 [facility]    CSN: [id number] Arrival date & time: 01/20/24  1336      History   Chief Complaint Chief Complaint  Patient presents with   Back Pain    HPI Jade Lopez is a 28 y.o. female.  1 week history of middle back pain. Worse with movement. Started after going to the gym several times over the week. She reports history of spinal fracture several years ago. Also has chronic pain and fibromyalgia followed by rheumatology. Not currently having numbness of extremities but has reported in the past  She has tried naproxen and tylenol. 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/28/2023   Chronic post-traumatic headache, not intractable 10/28/2023   Hypersomnia with sleep apnea 10/28/2023   Visual changes 10/28/2023   Abnormal laboratory test result 07/29/2023   Traumatic brain injury (HCC) 07/14/2023   History of nausea and vomiting 05/19/2023   Fibromyalgia 04/11/2023   Abscess of right breast 04/03/2023   Abdominal wall pain 03/01/2023   Sleep related headaches 02/24/2023   OSA on CPAP 02/24/2023   Sleeps in sitting position due to orthopnea 02/24/2023   Menorrhagia 01/28/2023   Healthcare maintenance 01/28/2023   Chest pain 01/28/2023   Back pain 01/28/2023   Type 2 diabetes mellitus (HCC) 11/28/2022   History of systemic lupus erythematosus (HCC) 11/28/2022   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/28/2022   Endometriosis 11/28/2022    Anxiety 11/28/2022   Vitamin D deficiency 09/13/2012   Asthma 08/22/2012    Past Surgical History:  Procedure Laterality Date   BREAST LUMPECTOMY Right 2024   CHOLECYSTECTOMY     DILATATION AND CURETTAGE/HYSTEROSCOPY WITH MINERVA     3 total   INCISION AND DRAINAGE ABSCESS  2021   buttocks   IR LUMBAR PUNCTURE  11/15/2023   RADIOLOGY WITH ANESTHESIA N/A 11/15/2023   Procedure: Lumbar Puncture;  Surgeon: Sterling Big, MD;  Location: [facility] OR;  Service: Radiology;  Laterality: N/A;   TONSILLECTOMY AND ADENOIDECTOMY      OB History     Gravida  0   Para  0   Term  0   Preterm  0   AB  0   Living  0      SAB  0   IAB  0   Ectopic  0   Multiple  0   Live Births  0            Home Medications    Prior to Admission medications   Medication Sig Start Date End Date Taking? Authorizing Provider  Accu-Chek Softclix Lancets lancets daily. 12/15/22   [provider]  acetaminophen (TYLENOL) 500 MG tablet Take 1,000 mg by mouth 2 (two) times daily.    [provider]  acetaZOLAMIDE (DIAMOX) 250 MG tablet Take 1 tablet (250 mg total)  by mouth 2 (two) times daily. Patient not taking: Reported on 01/13/2024 11/15/23   Dohmeier, Porfirio Mylar, MD  albuterol (PROVENTIL) (2.5 MG/3ML) 0.083% nebulizer solution Take 3 mLs (2.5 mg total) by nebulization every 6 (six) hours as needed for wheezing or shortness of breath. 12/08/23   [name], NP  albuterol (VENTOLIN HFA) 108 (90 Base) MCG/ACT inhaler Inhale 1-2 puffs into the lungs every 6 (six) hours as needed for wheezing or shortness of breath. 05/10/23   Marinda Elk, MD  BIOTIN PO Take 2 capsules by mouth daily. Hair, skin nails Patient not taking: Reported on 01/13/2024    [provider]  Blood Glucose Monitoring Suppl (MM EASY TOUCH GLUCOSE METER) w/Device KIT Please check your blood sugar daily, in the morning. 12/08/22   Marolyn Haller, MD  Blood Glucose Monitoring Suppl DEVI 1 each by Does not  apply route 3 (three) times daily. May dispense any manufacturer covered by patient's insurance. 05/10/23   Marinda Elk, MD  budesonide-formoterol (SYMBICORT) 80-4.5 MCG/ACT inhaler Inhale 2 puffs into the lungs 2 (two) times daily as needed. Patient taking differently: Inhale 2 puffs into the lungs 2 (two) times daily. 05/19/23   Kathleen Lime, MD  busPIRone (BUSPAR) 10 MG tablet Take 3 tablets (30 mg total) by mouth daily. Patient not taking: Reported on 11/11/2023 11/25/22   Marolyn Haller, MD  cyclobenzaprine (FLEXERIL) 10 MG tablet Take 1 tablet (10 mg total) by mouth at bedtime. 01/13/24   Rice, Jamesetta Orleans, MD  Glucose Blood (BLOOD GLUCOSE TEST STRIPS) STRP 1 each by Does not apply route 3 (three) times daily. Use as directed to check blood sugar. May dispense any manufacturer covered by patient's insurance and fits patient's device. 05/10/23   Marinda Elk, MD  HYDROcodone-acetaminophen (NORCO/VICODIN) 5-325 MG tablet Take 1 tablet by mouth every 6 (six) hours as needed. Patient not taking: Reported on 01/13/2024 12/02/23   Pamala Duffel  Lancet Device MISC 1 each by Does not apply route 3 (three) times daily. May dispense any manufacturer covered by patient's insurance. 05/10/23   Marinda Elk, MD  Lancets MISC 1 each by Does not apply route 3 (three) times daily. Use as directed to check blood sugar. May dispense any manufacturer covered by patient's insurance and fits patient's device. 05/10/23   Marinda Elk, MD  medroxyPROGESTERone (DEPO-PROVERA) 150 MG/ML injection Inject 150 mg into the muscle every 3 (three) months.    [provider]  MOUNJARO 7.5 MG/0.5ML Pen Inject into the skin. 09/30/23   [provider]  Multiple Vitamins-Minerals (ZINC PO) Take 1 tablet by mouth daily. Patient not taking: Reported on 01/13/2024    [provider]  naproxen (NAPROSYN) 500 MG tablet Take 1 tablet (500 mg total) by mouth 2 (two) times daily  with a meal. 10/31/23   Wallis Bamberg, PA-C  norethindrone (AYGESTIN) 5 MG tablet Take 10 mg by mouth at bedtime. 12/11/22   [provider]  ondansetron (ZOFRAN-ODT) 8 MG disintegrating tablet Take 1 tablet (8 mg total) by mouth every 8 (eight) hours as needed for nausea or vomiting. 05/17/23   [name], NP  pregabalin (LYRICA) 50 MG capsule Take 1 capsule (50 mg total) by mouth 3 (three) times daily. 05/19/23   Kathleen Lime, MD  promethazine-dextromethorphan (PROMETHAZINE-DM) 6.25-15 MG/5ML syrup Take 5 mLs by mouth 4 (four) times daily as needed for cough. Patient not taking: Reported on 01/13/2024 12/08/23   [name], NP  topiramate (  TOPAMAX) 25 MG tablet Take 1 tablet (25 mg total) by mouth 2 (two) times daily. Patient not taking: Reported on 01/13/2024 11/08/23   Dohmeier, Porfirio Mylar, MD  traZODone (DESYREL) 300 MG tablet Take 1 tablet (300 mg total) by mouth at bedtime. Patient not taking: Reported on 01/13/2024 09/22/23   Richardean Sale, DO  triamcinolone ointment (KENALOG) 0.1 % Apply 1 Application topically daily at 12 noon.    [provider]  Turmeric, Curcuma [place]a, (TURMERIC ROOT) POWD Take 15 mLs by mouth daily. Put in food Patient not taking: Reported on 01/13/2024    [provider]    Family History Family History  Problem Relation Age of Onset   Diabetes Mother    Hypertension Mother    Migraines Mother    Diabetes Father    Hypertension Father    Sleep apnea Father     Social History Social History   Tobacco Use   Smoking status: Never    Passive exposure: Never   Smokeless tobacco: Never  Vaping Use   Vaping status: Never Used  Substance Use Topics   Alcohol use: Yes    Comment: occasional   Drug use: No     Allergies   Clindamycin/lincomycin, Gabapentin, Other, Pregabalin, Cephalexin, Iodinated contrast media, Meloxicam, and Clindamycin   Review of Systems Review of Systems  Musculoskeletal:  Positive for back pain.   Per  HPI  Physical Exam Triage Vital Signs ED Triage Vitals  Encounter Vitals Group     BP 01/20/24 1425 135/88     Systolic BP Percentile --      Diastolic BP Percentile --      Pulse Rate 01/20/24 1425 96     Resp 01/20/24 1425 16     Temp 01/20/24 1425 98.7 F (37.1 C)     Temp Source 01/20/24 1425 Oral     SpO2 01/20/24 1425 98 %     Weight --      Height --      Head Circumference --      Peak Flow --      Pain Score 01/20/24 1426 8     Pain Loc --      Pain Education --      Exclude from Growth Chart --    No data found.  Updated Vital Signs BP 135/88 (BP Location: Left Arm)   Pulse 96   Temp 98.7 F (37.1 C) (Oral)   Resp 16   SpO2 98%    Physical Exam Vitals and nursing note reviewed.  Constitutional:      General: She is not in acute distress. HENT:     Mouth/Throat:     Mouth: Mucous membranes are moist.     Pharynx: Oropharynx is clear.  Eyes:     Extraocular Movements: Extraocular movements intact.     Conjunctiva/sclera: Conjunctivae normal.     Pupils: Pupils are equal, round, and reactive to light.  Cardiovascular:     Rate and Rhythm: Normal rate and regular rhythm.     Heart sounds: Normal heart sounds.  Pulmonary:     Effort: Pulmonary effort is normal.     Breath sounds: Normal breath sounds.  Musculoskeletal:        General: Normal range of motion.     Cervical back: Normal range of motion. No rigidity or tenderness.     Comments: Midline thoracic back pain. Habitus limits exam  Skin:    General: Skin is warm and dry.  Neurological:  General: No focal deficit present.     Mental Status: She is alert and oriented to person, place, and time.     Cranial Nerves: Cranial nerves 2-12 are intact. No cranial nerve deficit.     Sensory: Sensation is intact.     Motor: Motor function is intact. No weakness.     Coordination: Coordination is intact.     Gait: Gait is intact.     Deep Tendon Reflexes: Reflexes are normal and symmetric.      Comments: Strength 5/5. Sensation intact throughout      UC Treatments / Results  Labs (all labs ordered are listed, but only abnormal results are displayed) Labs Reviewed - No data to display  EKG   Radiology No results found.  Procedures Procedures (including critical care time)  Medications Ordered in UC Medications  ketorolac (TORADOL) 30 MG/ML injection 60 mg (60 mg Intramuscular Given 01/20/24 1459)    Initial Impression / Assessment and Plan / UC Course  I have reviewed the triage vital signs and the nursing notes.  Pertinent labs & imaging results that were available during my care of the patient were reviewed by me and considered in my medical decision making (see chart for details).  Well appearing, alert, stable vitals. Neurologically intact. Chronic back pain, acute flare this week from strenuous activity at the gym. Offered IM toradol, patient accepts. Will need to follow with pain or spine specialist; provided with clinic info. Also contact rheum to discuss. ED precautions for severe or worsening. Patient agrees to plan A note for school is provided   Final Clinical Impressions(s) / UC Diagnoses   Final diagnoses:  Acute midline thoracic back pain  Other chronic pain     Discharge Instructions      The Toradol injection given today should start to work in about 30 minutes. Please do not use any NSAIDs (ibuprofen/Advil, naproxen/Aleve, etc) for the next 12 hours. You can safely use tylenol.   Please contact your rheumatologist for follow up, and a spine/pain specialist.  Please go to the emergency department if symptoms worsen or become severe.    ED Prescriptions   None    PDMP not reviewed this encounter.   Marlow Baars, [place] 01/20/24 1733

## 2024-01-20 NOTE — Discharge Instructions (Addendum)
 The Toradol injection given today should start to work in about 30 minutes. Please do not use any NSAIDs (ibuprofen/Advil, naproxen/Aleve, etc) for the next 12 hours. You can safely use tylenol.   Please contact your rheumatologist for follow up, and a spine/pain specialist.  Please go to the emergency department if symptoms worsen or become severe.

## 2024-01-20 NOTE — ED Triage Notes (Signed)
 Pt states mid to lower back pain states she has a old back injury. States she  has been working out. States she has been taking lyrica and flexeril at home with no relief.

## 2024-01-21 ENCOUNTER — Telehealth: Payer: Self-pay | Admitting: *Deleted

## 2024-01-21 NOTE — Telephone Encounter (Signed)
 Patient is having a flare, patient went to urgent care 01/20/2024, pain is worse today, thoracic spine, throbbing, shooting pain, swelling, [facility], [employer].

## 2024-01-24 ENCOUNTER — Emergency Department (HOSPITAL_COMMUNITY)
Admission: EM | Admit: 2024-01-24 | Discharge: 2024-01-24 | Disposition: A | Discharge status: Home / Self Care | Attending: Student | Admitting: Student

## 2024-01-24 ENCOUNTER — Emergency Department (HOSPITAL_COMMUNITY)

## 2024-01-24 ENCOUNTER — Encounter (HOSPITAL_COMMUNITY): Payer: Self-pay

## 2024-01-24 DIAGNOSIS — M549 Dorsalgia, unspecified: Secondary | ICD-10-CM | POA: Diagnosis present

## 2024-01-24 DIAGNOSIS — Z794 Long term (current) use of insulin: Secondary | ICD-10-CM | POA: Insufficient documentation

## 2024-01-24 DIAGNOSIS — S39012A Strain of muscle, fascia and tendon of lower back, initial encounter: Secondary | ICD-10-CM

## 2024-01-24 DIAGNOSIS — J45909 Unspecified asthma, uncomplicated: Secondary | ICD-10-CM | POA: Insufficient documentation

## 2024-01-24 DIAGNOSIS — E119 Type 2 diabetes mellitus without complications: Secondary | ICD-10-CM | POA: Insufficient documentation

## 2024-01-24 LAB — PREGNANCY, URINE: Preg Test, Ur: NEGATIVE

## 2024-01-24 MED ORDER — NAPROXEN 500 MG PO TABS: 500.0000 mg | ORAL_TABLET | Freq: Two times a day (BID) | ORAL | 0 refills | Status: AC

## 2024-01-24 MED ORDER — LIDOCAINE 5 % EX PTCH
1.0000 | MEDICATED_PATCH | CUTANEOUS | Status: DC
  Administered 2024-01-24: 1 via TRANSDERMAL
  Filled 2024-01-24: qty 1

## 2024-01-24 MED ORDER — ACETAMINOPHEN 500 MG PO TABS: 1000.0000 mg | ORAL_TABLET | Freq: Three times a day (TID) | ORAL | 0 refills | Status: AC

## 2024-01-24 MED ORDER — MORPHINE SULFATE (PF) 4 MG/ML IV SOLN
6.0000 mg | Freq: Once | INTRAVENOUS | Status: DC
  Filled 2024-01-24: qty 2

## 2024-01-24 MED ORDER — OXYCODONE-ACETAMINOPHEN 5-325 MG PO TABS
1.0000 | ORAL_TABLET | Freq: Once | ORAL | Status: AC
  Administered 2024-01-24: 1 via ORAL
  Filled 2024-01-24: qty 1

## 2024-01-24 MED ORDER — KETOROLAC TROMETHAMINE 15 MG/ML IJ SOLN
15.0000 mg | Freq: Once | INTRAMUSCULAR | Status: AC
  Administered 2024-01-24: 15 mg via INTRAMUSCULAR
  Filled 2024-01-24: qty 1

## 2024-01-24 MED ORDER — MORPHINE SULFATE (PF) 4 MG/ML IV SOLN
6.0000 mg | Freq: Once | INTRAVENOUS | Status: AC
  Administered 2024-01-24: 6 mg via INTRAMUSCULAR

## 2024-01-24 MED ORDER — OXYCODONE HCL 5 MG PO TABS: 5.0000 mg | ORAL_TABLET | ORAL | 0 refills | Status: DC | PRN

## 2024-01-24 NOTE — ED Triage Notes (Addendum)
 Pt c/o increasing mid back pain and intermittent weakness in BLEs x1 week.  Pt reports having an old spinal injury and RA.  Sts she started working out again a couple weeks ago.  Pt reports she has been taking Lyrica and flexeril w/o relief.

## 2024-01-24 NOTE — ED Provider Notes (Signed)
 [place] [facility] AT [facility] Provider Note  CSN: [id number] Arrival date & time: 01/24/24 1355  Chief Complaint(s) Back Pain  HPI Jade Lopez is a 28 y.o. female with PMH lupus, endometriosis, T2DM who presents emergency room for evaluation of back pain.  She states that on 01/20/2024 she went to start working out again and was doing push press squats when she felt significant pain in her back.  She states that she has a previous injury to the back.  Was seen in urgent care on 01/20/2024 and was given ibuprofen and antihistamine muscle relaxants.  She has been taking these without relief.  Denies loss of bowel or bladder, numbness, tingling, weakness of the lower extremities or any other red flag signs of back pain.  No additional trauma to the back.  Pain primarily in the low T-spine today.  Denies chest pain, shortness of breath, abdominal pain, nausea, vomiting or other systemic symptoms.   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/28/2023   Chronic post-traumatic headache, not intractable 10/28/2023   Hypersomnia with sleep apnea 10/28/2023   Visual changes 10/28/2023   Abnormal laboratory test result 07/29/2023   Traumatic brain injury (HCC) 07/14/2023   History of nausea and vomiting 05/19/2023   Fibromyalgia 04/11/2023   Abscess of right breast 04/03/2023   Abdominal wall pain 03/01/2023   Sleep related headaches 02/24/2023   OSA on CPAP 02/24/2023   Sleeps in sitting position due to orthopnea  02/24/2023   Menorrhagia 01/28/2023   Healthcare maintenance 01/28/2023   Chest pain 01/28/2023   Back pain 01/28/2023   Type 2 diabetes mellitus (HCC) 11/28/2022   History of systemic lupus erythematosus (HCC) 11/28/2022   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/28/2022   Endometriosis 11/28/2022   Anxiety 11/28/2022   Vitamin D deficiency 09/13/2012   Asthma 08/22/2012   Home Medication(s) Prior to Admission medications   Medication Sig Start Date End Date Taking? Authorizing Provider  Accu-Chek Softclix Lancets lancets daily. 12/15/22   [provider]  acetaminophen (TYLENOL) 500 MG tablet Take 1,000 mg by mouth 2 (two) times daily.    [provider]  acetaZOLAMIDE (DIAMOX) 250 MG tablet Take 1 tablet (250 mg total) by mouth 2 (two) times daily. Patient not taking: Reported on 01/13/2024 11/15/23   Dohmeier, Porfirio Mylar, MD  albuterol (PROVENTIL) (2.5 MG/3ML) 0.083% nebulizer solution Take 3 mLs (2.5 mg total) by nebulization every 6 (six) hours as needed for wheezing or shortness of breath. 12/08/23   [name], NP  albuterol (VENTOLIN HFA) 108 (90 Base) MCG/ACT inhaler Inhale 1-2 puffs into the lungs every 6 (six) hours as needed for wheezing or shortness of breath. 05/10/23   Marinda Elk, MD  BIOTIN PO Take 2 capsules by mouth daily. Hair, skin nails Patient not taking: Reported on 01/13/2024    [provider]  Blood Glucose Monitoring Suppl (MM EASY TOUCH GLUCOSE METER) w/Device KIT  Please check your blood sugar daily, in the morning. 12/08/22   Marolyn Haller, MD  Blood Glucose Monitoring Suppl DEVI 1 each by Does not apply route 3 (three) times daily. May dispense any manufacturer covered by patient's insurance. 05/10/23   Marinda Elk, MD  budesonide-formoterol (SYMBICORT) 80-4.5 MCG/ACT inhaler Inhale 2 puffs into the lungs 2 (two) times daily as needed. Patient taking differently: Inhale 2 puffs into the lungs 2 (two) times daily. 05/19/23   Kathleen Lime, MD   busPIRone (BUSPAR) 10 MG tablet Take 3 tablets (30 mg total) by mouth daily. Patient not taking: Reported on 11/11/2023 11/25/22   Marolyn Haller, MD  cyclobenzaprine (FLEXERIL) 10 MG tablet Take 1 tablet (10 mg total) by mouth at bedtime. 01/13/24   Rice, Jamesetta Orleans, MD  Glucose Blood (BLOOD GLUCOSE TEST STRIPS) STRP 1 each by Does not apply route 3 (three) times daily. Use as directed to check blood sugar. May dispense any manufacturer covered by patient's insurance and fits patient's device. 05/10/23   Marinda Elk, MD  HYDROcodone-acetaminophen (NORCO/VICODIN) 5-325 MG tablet Take 1 tablet by mouth every 6 (six) hours as needed. Patient not taking: Reported on 01/13/2024 12/02/23   Pamala Duffel  Lancet Device MISC 1 each by Does not apply route 3 (three) times daily. May dispense any manufacturer covered by patient's insurance. 05/10/23   Marinda Elk, MD  Lancets MISC 1 each by Does not apply route 3 (three) times daily. Use as directed to check blood sugar. May dispense any manufacturer covered by patient's insurance and fits patient's device. 05/10/23   Marinda Elk, MD  medroxyPROGESTERone (DEPO-PROVERA) 150 MG/ML injection Inject 150 mg into the muscle every 3 (three) months.    [provider]  MOUNJARO 7.5 MG/0.5ML Pen Inject into the skin. 09/30/23   [provider]  Multiple Vitamins-Minerals (ZINC PO) Take 1 tablet by mouth daily. Patient not taking: Reported on 01/13/2024    [provider]  naproxen (NAPROSYN) 500 MG tablet Take 1 tablet (500 mg total) by mouth 2 (two) times daily with a meal. 10/31/23   Wallis Bamberg, PA-C  norethindrone (AYGESTIN) 5 MG tablet Take 10 mg by mouth at bedtime. 12/11/22   [provider]  ondansetron (ZOFRAN-ODT) 8 MG disintegrating tablet Take 1 tablet (8 mg total) by mouth every 8 (eight) hours as needed for nausea or vomiting. 05/17/23   [name], NP  pregabalin (LYRICA) 50 MG capsule Take  1 capsule (50 mg total) by mouth 3 (three) times daily. 05/19/23   Kathleen Lime, MD  promethazine-dextromethorphan (PROMETHAZINE-DM) 6.25-15 MG/5ML syrup Take 5 mLs by mouth 4 (four) times daily as needed for cough. Patient not taking: Reported on 01/13/2024 12/08/23   [name], NP  topiramate (TOPAMAX) 25 MG tablet Take 1 tablet (25 mg total) by mouth 2 (two) times daily. Patient not taking: Reported on 01/13/2024 11/08/23   Dohmeier, Porfirio Mylar, MD  traZODone (DESYREL) 300 MG tablet Take 1 tablet (300 mg total) by mouth at bedtime. Patient not taking: Reported on 01/13/2024 09/22/23   Richardean Sale, DO  triamcinolone ointment (KENALOG) 0.1 % Apply 1 Application topically daily at 12 noon.    [provider]  Turmeric, Curcuma [place]a, (TURMERIC ROOT) POWD Take 15 mLs by mouth daily. Put in food Patient not taking: Reported on 01/13/2024    [provider]  Past Surgical History Past Surgical History:  Procedure Laterality Date   BREAST LUMPECTOMY Right 2024   CHOLECYSTECTOMY     DILATATION AND CURETTAGE/HYSTEROSCOPY WITH MINERVA     3 total   INCISION AND DRAINAGE ABSCESS  2021   buttocks   IR LUMBAR PUNCTURE  11/15/2023   RADIOLOGY WITH ANESTHESIA N/A 11/15/2023   Procedure: Lumbar Puncture;  Surgeon: Sterling Big, MD;  Location: [facility] OR;  Service: Radiology;  Laterality: N/A;   TONSILLECTOMY AND ADENOIDECTOMY     Family History Family History  Problem Relation Age of Onset   Diabetes Mother    Hypertension Mother    Migraines Mother    Diabetes Father    Hypertension Father    Sleep apnea Father     Social History Social History   Tobacco Use   Smoking status: Never    Passive exposure: Never   Smokeless tobacco: Never  Vaping Use   Vaping status: Never Used  Substance Use Topics   Alcohol use: Yes    Comment: occasional    Drug use: No   Allergies Clindamycin/lincomycin, Gabapentin, Other, Pregabalin, Cephalexin, Iodinated contrast media, Meloxicam, and Clindamycin  Review of Systems Review of Systems  Musculoskeletal:  Positive for back pain.    Physical Exam Vital Signs  I have reviewed the triage vital signs BP (!) 146/99 (BP Location: Right Arm)   Pulse (!) 108   Temp 98.3 F (36.8 C)   Resp 18   Ht 5' 4.5" (1.638 m)   Wt (!) 190.5 kg   SpO2 100%   BMI 70.98 kg/m   Physical Exam Vitals and nursing note reviewed.  Constitutional:      General: She is not in acute distress.    Appearance: She is well-developed.  HENT:     Head: Normocephalic and atraumatic.  Eyes:     Conjunctiva/sclera: Conjunctivae normal.  Cardiovascular:     Rate and Rhythm: Normal rate and regular rhythm.     Heart sounds: No murmur heard. Pulmonary:     Effort: Pulmonary effort is normal. No respiratory distress.     Breath sounds: Normal breath sounds.  Abdominal:     Palpations: Abdomen is soft.     Tenderness: There is no abdominal tenderness.  Musculoskeletal:        General: Tenderness present. No swelling.     Cervical back: Neck supple.  Skin:    General: Skin is warm and dry.     Capillary Refill: Capillary refill takes less than 2 seconds.  Neurological:     Mental Status: She is alert.  Psychiatric:        Mood and Affect: Mood normal.     ED Results and Treatments Labs (all labs ordered are listed, but only abnormal results are displayed) Labs Reviewed  PREGNANCY, URINE  Radiology DG Thoracic Spine 2 View Result Date: 01/24/2024 CLINICAL DATA:  Back pain EXAM: THORACIC SPINE 2 VIEWS COMPARISON:  CT 05/07/2023 FINDINGS: Slight convex leftward scoliosis centered in the midthoracic spine. No acute fracture or focal bone lesion. No malalignment. Slight wedged appearance of  the T11 vertebral body is stable since prior CT. IMPRESSION: No acute bony abnormality. Electronically Signed   By: Charlett Nose M.D.   On: 01/24/2024 17:04    Pertinent labs & imaging results that were available during my care of the patient were reviewed by me and considered in my medical decision making (see MDM for details).  Medications Ordered in ED Medications  ketorolac (TORADOL) 15 MG/ML injection 15 mg (has no administration in time range)  lidocaine (LIDODERM) 5 % 1 patch (has no administration in time range)  oxyCODONE-acetaminophen (PERCOCET/ROXICET) 5-325 MG per tablet 1 tablet (1 tablet Oral Given 01/24/24 1504)                                                                                                                                     Procedures Procedures  (including critical care time)  Medical Decision Making / ED Course   This patient presents to the ED for concern of back pain, this involves an extensive number of treatment options, and is a complaint that carries with it a high risk of complications and morbidity.  The differential diagnosis includes muscular strain/spasm, lumbago, disc herniation, spinal fracture, cauda equina, epidural abscess or hematoma, psoas abscess, pyelonephritis  MDM: Patient seen emergency room for evaluation of back pain.  Physical exam with pinpoint tenderness in the low T-spine but is otherwise unremarkable.  No CVA tenderness and low suspicion for kidney pathology today.  Neurologic exam is unremarkable with no focal motor or sensory deficits.  X-ray without fracture.  Patient given oral pain medication and Toradol as well as a lidocaine patch.  Minimal improvement with these interventions and she was given an injection of morphine.  On reevaluation her symptoms have significantly proved and she is able to ambulate without difficulty.  We had an extensive discussion about pain control and patient will start a regimen of Tylenol,  Naprosyn, Lidoderm and oxycodone for breakthrough pain only.  Given pinpoint tenderness in the back with pain that started immediately after working out, suspect thoracolumbar sprain at this time.  Very low suspicion for RP hematoma or more sinister pathology in the abdomen with referred pain today.  At this time she does not meet inpatient criteria for admission and will be discharged outpatient follow-up.   Additional history obtained:  -External records from outside source obtained and reviewed including: Chart review including previous notes, labs, imaging, consultation notes   Lab Tests: -I ordered, reviewed, and interpreted labs.   The pertinent results include:   Labs Reviewed  PREGNANCY, URINE     Imaging Studies ordered: I ordered imaging studies including thoracic x-rays I independently  visualized and interpreted imaging. I agree with the radiologist interpretation   Medicines ordered and prescription drug management: Meds ordered this encounter  Medications   oxyCODONE-acetaminophen (PERCOCET/ROXICET) 5-325 MG per tablet 1 tablet    Refill:  0   ketorolac (TORADOL) 15 MG/ML injection 15 mg   lidocaine (LIDODERM) 5 % 1 patch    -I have reviewed the patients home medicines and have made adjustments as needed  Critical interventions none  Social Determinants of Health:  Factors impacting patients care include: none   Reevaluation: After the interventions noted above, I reevaluated the patient and found that they have :improved  Co morbidities that complicate the patient evaluation  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   T2DM (type 2 diabetes mellitus) (HCC)    TOA (tubo-ovarian abscess)        Dispostion: I considered admission for this patient, but at this time she does not meet inpatient criteria for admission and will be discharged with outpatient follow-up     Final Clinical Impression(s) / ED Diagnoses Final diagnoses:  None     @PCDICTATION @    Glendora Score, MD 01/25/24 1203

## 2024-01-24 NOTE — ED Provider Triage Note (Signed)
 Emergency Medicine Provider Triage Evaluation Note  Jade Lopez , a 28 y.o. female  was evaluated in triage.  Pt complains of back pain, weakness in legs. Recently worked out. Hx of T11 fracture. Denies groin numbess, loss of control of bladder.  Review of Systems  Positive: Back pain, tingling Negative: Groin numbness  Physical Exam  BP (!) 146/99 (BP Location: Right Arm)   Pulse (!) 108   Temp 98.3 F (36.8 C)   Resp 18   Ht 5' 4.5" (1.638 m)   Wt (!) 190.5 kg   SpO2 100%   BMI 70.98 kg/m  Gen:   Awake, no distress   Resp:  Normal effort  MSK:   Moves extremities without difficulty  Other:    Medical Decision Making  Medically screening exam initiated at 2:53 PM.  Appropriate orders placed.  Jade Lopez was informed that the remainder of the evaluation will be completed by another provider, this initial triage assessment does not replace that evaluation, and the importance of remaining in the ED until their evaluation is complete.  Workup initiated in triage    Olene Floss, [place] 01/24/24 1454

## 2024-01-25 ENCOUNTER — Telehealth (HOSPITAL_COMMUNITY): Payer: Self-pay | Admitting: Student

## 2024-01-25 MED ORDER — LIDOCAINE 5 % EX PTCH: 1.0000 | MEDICATED_PATCH | CUTANEOUS | 0 refills | Status: DC

## 2024-01-25 NOTE — Telephone Encounter (Signed)
 Note created to send lidocaine patch to pharmacy

## 2024-01-27 ENCOUNTER — Encounter: Payer: Self-pay | Admitting: Physician Assistant

## 2024-01-27 ENCOUNTER — Ambulatory Visit: Payer: BC Managed Care – PPO | Admitting: Neurology

## 2024-02-01 ENCOUNTER — Encounter: Payer: Self-pay | Admitting: Internal Medicine

## 2024-02-01 NOTE — Telephone Encounter (Signed)
 Contacted the patient and advised patient we have Virgilio Belling, PA-C listed as her PCP. Patient states internal medicine had contacted her on Dr. Gregary Cromer behalf. After reviewing the most recent office visit, appears Dr. Dimple Casey wanted to contact internal medicine to adjust the patient's Lyrica. Patient states Virgilio Belling, PA-C now prescribes her Lyrica. Advised the patient I would let Dr. Dimple Casey know.   Patient also inquires about her lab work from our office. Please advise.

## 2024-02-17 ENCOUNTER — Encounter: Admitting: Physical Medicine & Rehabilitation

## 2024-02-17 ENCOUNTER — Encounter: Payer: Self-pay | Admitting: Internal Medicine

## 2024-02-21 ENCOUNTER — Encounter: Payer: Self-pay | Admitting: Neurology

## 2024-02-21 ENCOUNTER — Ambulatory Visit (INDEPENDENT_AMBULATORY_CARE_PROVIDER_SITE_OTHER): Payer: Medicaid Other | Admitting: Neurology

## 2024-02-21 VITALS — BP 133/95 | HR 101 | Ht 64.0 in | Wt >= 6400 oz

## 2024-02-21 DIAGNOSIS — G4730 Sleep apnea, unspecified: Secondary | ICD-10-CM

## 2024-02-21 DIAGNOSIS — R0601 Orthopnea: Secondary | ICD-10-CM | POA: Diagnosis not present

## 2024-02-21 DIAGNOSIS — H539 Unspecified visual disturbance: Secondary | ICD-10-CM

## 2024-02-21 DIAGNOSIS — E662 Morbid (severe) obesity with alveolar hypoventilation: Secondary | ICD-10-CM

## 2024-02-21 DIAGNOSIS — R519 Headache, unspecified: Secondary | ICD-10-CM

## 2024-02-21 DIAGNOSIS — E669 Obesity, unspecified: Secondary | ICD-10-CM

## 2024-02-21 DIAGNOSIS — G4710 Hypersomnia, unspecified: Secondary | ICD-10-CM | POA: Diagnosis not present

## 2024-02-21 DIAGNOSIS — E236 Other disorders of pituitary gland: Secondary | ICD-10-CM

## 2024-02-21 NOTE — Progress Notes (Signed)
 Provider:  Melvyn Novas, MD  Primary Care Physician:  Virgilio Belling, PA-C [address] [place] [address]     Referring Provider: Manuela Neptune, Md [address] [place],  [place] [address]          Chief Complaint according to patient   Patient presents with:                HISTORY OF PRESENT ILLNESS:  Jade Lopez is a 28 y.o. female patient who is here for revisit 02/21/2024 for MRI images with   empty sella, OHV,  OSA on CPAP and headaches.  .  Chief concern according to patient :  " I need to know which  medication for pseudotumor can help my headaches without  interfering with hormonal Birth control. I use depo provera shot plus pills"  I have no periods at all"    "   We discussed that 50 mg bid is not a dose that is likely to reduce the efficacy of Hormonal Births Control.  We will restart.   I will refill nortrypiline.    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  Her current device is set at 15 cm of water I will like to start an auto titration device between 7 and  20 cm of water pressure , 3 cm EPR, humidifier and interface of her choosing.   she is doing well on CPAP, 100 % compliance.  Residual AHI  2.3/h and no HEADACHES when sleeping with CPAP on. 95% pressure was only 7 cm water lower than expected.  Epworth score at 7 points.     Jade Lopez is a 28 y.o. female patient who is here for revisit 10/28/2023 for  HEADACHES.Marland Kitchen posttraumatic , 07-05-2023 date of injury. Chief concern according to patient :  " I have had headaches ever since I hit my head at work, shooting pain , through the crown, outside inwards.   Sudden pain,severe, "pain will go away after 3 minutes but lasts longer when I am stressed ", After 12 noon most often, while active - or when stressed. " These are not my usual migraines.  Frequent: 5-6 times a day.  Nausea sometimes, has underlying photophobia anyway, computer screen can be a trigger, Phonophobia.      She was seen here in April for a SLEEP consultation,and has undergone a HST, received a new CPAP machine.  No data here for new CPAP.      Last encounter was a Sleep consultation : 02/24/2023 from PCP.  Chief concern according to patient :  " I was diagnosed at age 15 with OSA, used CPAP ever since."  I have the pleasure of seeing Jade Lopez 02/24/23 an AA-28 year-old female with a possible sleep disorder.  The patient had the first sleep study in the year 2010 in [place], [place],  with a result of OSA , a follow up study was done in [place]. Current settings ramping from 8-15 cm water. Her machine is 28 years OLD !!!!   Sleep relevant medical history: Sleepiness, fatigue, Morbid obesity, no Nocturia, had a Tonsillectomy, no cervical spine surgery. She reports that she chokes often after drink or food intake. History of GERD.     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 She lives in a household with sister and brother - in Social worker. One cat.  The patient currently works in daytime/ but used to work in shifts( Chief Technology Officer,) until 2022   Tobacco use; none .  ETOH use ; yes, 2/ month,  Caffeine intake in form of Coffee( 4/ day ) Soda( /) Tea ( /) or energy drinks Exercise in form of swimming.       Sleep habits are as follows: The patient's dinner time is between 8 PM. The patient goes to bed at 10 PM and continues to sleep for 5 hours, wakes and goes to sleep again for a total 7-8  .  The preferred sleep position is dictated by orthopnoea , with the support of 10 pillows.  Dreams are reportedly rare/ infrequent. The patient wakes up with an alarm. Multiple alarms at 6, 7.30 and 7.45,  7. 30-   AM is the usual rise time.She reports not feeling refreshed or restored in AM, with symptoms such as dry mouth, morning headaches, and residual fatigue. She has often migraines in AM, often so severe that it leads to ED visits.  Naps are taken infrequently, they are less refreshing than nocturnal sleep.       Review of Systems: Out of a complete 14 system review, the patient complains of only the following symptoms, and all other reviewed systems are negative.:   .epworth score 7 / 24 points.    Social History   Socioeconomic History   Marital status: Single    Spouse name: Not on file   Number of children: Not on file   Years of education: Not on file   Highest education level: Not on file  Occupational History   Not on file  Tobacco Use   Smoking status: Never    Passive exposure: Never   Smokeless tobacco: Never  Vaping Use   Vaping status: Never Used  Substance and Sexual Activity   Alcohol use: Yes    Comment: occasional   Drug use: No   Sexual activity: Yes    Birth control/protection: Injection, Pill  Other Topics Concern   Not on file  Social History Narrative   Pt lives alone    Pt not working    Social Drivers of Corporate investment banker Strain: Not on file  Food Insecurity: Low Risk  (12/03/2023)   Received from [facility]   Hunger Vital Sign    Worried About Running Out of Food in the Last Year: Never true    Ran Out of Food in the Last Year: Never true  Transportation Needs: No Transportation Needs (12/03/2023)   Received from [facility]    In the past 12 months, has lack of reliable transportation kept you from  medical appointments, meetings, work or from getting things needed for daily living? : No  Physical  Activity: Not on file  Stress: Not on file  Social Connections: Socially Isolated (12/14/2022)   Social Connection and Isolation Panel [NHANES]    Frequency of Communication with Friends and Family: More than three times a week    Frequency of Social Gatherings with Friends and Family: More than three times a week    Attends Religious Services: Never    Database administrator or Organizations: No    Attends Engineer, structural: Never    Marital Status: Never married    Family History  Problem Relation Age of Onset   Diabetes Mother    Hypertension Mother    Migraines Mother    Diabetes Father    Hypertension Father    Sleep apnea Father     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   T2DM (type 2 diabetes mellitus) (HCC)    TOA (tubo-ovarian abscess)     Past Surgical History:  Procedure Laterality Date   BREAST LUMPECTOMY Right 2024   CHOLECYSTECTOMY     DILATATION AND CURETTAGE/HYSTEROSCOPY WITH MINERVA     3 total   INCISION AND DRAINAGE ABSCESS  2021   buttocks   IR LUMBAR PUNCTURE  11/15/2023   RADIOLOGY WITH ANESTHESIA N/A 11/15/2023   Procedure: Lumbar Puncture;  Surgeon: Roxie Cord, MD;  Location: [facility] OR;  Service: Radiology;  Laterality: N/A;   TONSILLECTOMY AND ADENOIDECTOMY       Current Outpatient Medications on File Prior to Visit  Medication Sig Dispense Refill   Accu-Chek Softclix Lancets lancets daily.     acetaminophen (TYLENOL) 500 MG tablet Take 2 tablets (1,000 mg total) by mouth every 8 (eight) hours. 180 tablet 0   albuterol (PROVENTIL) (2.5 MG/3ML) 0.083% nebulizer solution Take 3 mLs (2.5 mg total) by nebulization every 6 (six) hours as needed for wheezing or shortness of breath. 75 mL  0   albuterol (VENTOLIN HFA) 108 (90 Base) MCG/ACT inhaler Inhale 1-2 puffs into the lungs every 6 (six) hours as needed for wheezing or shortness of breath. 1 each 1   BIOTIN PO Take 2 capsules by mouth daily. Hair, skin nails     Blood Glucose Monitoring Suppl (MM EASY TOUCH GLUCOSE METER) w/Device KIT Please check your blood sugar daily, in the morning. 1 kit 0   Blood Glucose Monitoring Suppl DEVI 1 each by Does not apply route 3 (three) times daily. May dispense any manufacturer covered by patient's insurance. 1 each 0   budesonide-formoterol (SYMBICORT) 80-4.5 MCG/ACT inhaler Inhale 2 puffs into the lungs 2 (two) times daily as needed. (Patient taking differently: Inhale 2 puffs into the lungs 2 (two) times daily.) 1 each 12   busPIRone (BUSPAR) 10 MG tablet Take 3 tablets (30 mg total) by mouth daily. 30 tablet 5   cyclobenzaprine (FLEXERIL) 10 MG tablet Take 1 tablet (10 mg total) by mouth at bedtime. 34 tablet 0   Glucose Blood (BLOOD GLUCOSE TEST STRIPS) STRP 1 each by Does not apply route 3 (three) times daily. Use as directed to check blood sugar.  May dispense any manufacturer covered by patient's insurance and fits patient's device. 100 strip 0   Lancet Device MISC 1 each by Does not apply route 3 (three) times daily. May dispense any manufacturer covered by patient's insurance. 1 each 0   Lancets MISC 1 each by Does not apply route 3 (three) times daily. Use as directed to check blood sugar. May dispense any manufacturer covered by patient's insurance and fits patient's device. 100 each 0   lidocaine (LIDODERM) 5 % Place 1 patch onto the skin daily. Remove & Discard patch within 12 hours or as directed by MD 30 patch 0   medroxyPROGESTERone (DEPO-PROVERA) 150 MG/ML injection Inject 150 mg into the muscle every 3 (three) months.     MOUNJARO 7.5 MG/0.5ML Pen Inject into the skin.     Multiple Vitamins-Minerals (ZINC PO) Take 1 tablet by mouth daily.     naproxen (NAPROSYN) 500 MG tablet  Take 1 tablet (500 mg total) by mouth 2 (two) times daily with a meal. 30 tablet 0   norethindrone (AYGESTIN) 5 MG tablet Take 10 mg by mouth at bedtime.     ondansetron (ZOFRAN-ODT) 8 MG disintegrating tablet Take 1 tablet (8 mg total) by mouth every 8 (eight) hours as needed for nausea or vomiting. 20 tablet 0   oxyCODONE (ROXICODONE) 5 MG immediate release tablet Take 1 tablet (5 mg total) by mouth every 4 (four) hours as needed for severe pain (pain score 7-10). 10 tablet 0   pregabalin (LYRICA) 50 MG capsule Take 1 capsule (50 mg total) by mouth 3 (three) times daily. 90 capsule 3   tirzepatide (MOUNJARO) 7.5 MG/0.5ML Pen Inject 7.5 mg into the skin once a week.     traZODone (DESYREL) 300 MG tablet Take 1 tablet (300 mg total) by mouth at bedtime. 30 tablet 0   triamcinolone ointment (KENALOG) 0.1 % Apply 1 Application topically daily at 12 noon.     Turmeric, Curcuma Longa, (TURMERIC ROOT) POWD Take 15 mLs by mouth daily. Put in food     acetaZOLAMIDE (DIAMOX) 250 MG tablet Take 1 tablet (250 mg total) by mouth 2 (two) times daily. (Patient not taking: Reported on 02/21/2024) 60 tablet 5   HYDROcodone-acetaminophen (NORCO/VICODIN) 5-325 MG tablet Take 1 tablet by mouth every 6 (six) hours as needed. (Patient not taking: Reported on 02/21/2024) 8 tablet 0   promethazine-dextromethorphan (PROMETHAZINE-DM) 6.25-15 MG/5ML syrup Take 5 mLs by mouth 4 (four) times daily as needed for cough. (Patient not taking: Reported on 02/21/2024) 118 mL 0   topiramate (TOPAMAX) 25 MG tablet Take 1 tablet (25 mg total) by mouth 2 (two) times daily. (Patient not taking: Reported on 01/13/2024) 60 tablet 3   No current facility-administered medications on file prior to visit.    Allergies  Allergen Reactions   Clindamycin/Lincomycin Other (See Comments), Hives, Palpitations, Rash and Swelling    Other Reaction(s): Racing heart beat   Gabapentin Hives, Nausea And Vomiting, Palpitations, Rash and Shortness Of Breath     Other Reaction(s): Unknown   Other Anaphylaxis    Allergic to Rynatan cough syrup.   Pregabalin Rash, Hives and Shortness Of Breath    Allergic to the generic  Patient tolerates BRAND Lyrica only -- cannot take Pregabalin   Cephalexin Diarrhea and Hives   Iodinated Contrast Media Nausea And Vomiting    CT and MRI   Meloxicam    Clindamycin Rash     DIAGNOSTIC DATA (LABS, IMAGING, TESTING) - I reviewed patient records,  labs, notes, testing and imaging myself where available.  Lab Results  Component Value Date   WBC 9.4 01/13/2024   HGB 12.6 01/13/2024   HCT 39.6 01/13/2024   MCV 83.4 01/13/2024   PLT 270 01/13/2024      Component Value Date/Time   NA 139 01/13/2024 0821   NA 141 11/25/2022 1149   K 4.4 01/13/2024 0821   CL 104 01/13/2024 0821   CO2 27 01/13/2024 0821   GLUCOSE 166 (H) 01/13/2024 0821   BUN 12 01/13/2024 0821   BUN 7 11/25/2022 1149   CREATININE 0.68 01/13/2024 0821   CALCIUM 9.0 01/13/2024 0821   PROT 6.5 01/13/2024 0821   PROT 6.6 11/25/2022 1149   ALBUMIN 3.3 (L) 05/09/2023 0709   ALBUMIN 3.7 (L) 11/25/2022 1149   AST 9 (L) 01/13/2024 0821   ALT 14 01/13/2024 0821   ALKPHOS 92 05/09/2023 0709   BILITOT 0.3 01/13/2024 0821   BILITOT <0.2 11/25/2022 1149   GFRNONAA >60 12/01/2023 1934   Lab Results  Component Value Date   CHOL 166 11/30/2022   HDL 51 11/30/2022   LDLCALC 96 11/30/2022   TRIG 103 11/30/2022   CHOLHDL 3.3 11/30/2022   Lab Results  Component Value Date   HGBA1C 7.7 (H) 05/09/2023   Lab Results  Component Value Date   VITAMINB12 483 04/09/2023   Lab Results  Component Value Date   TSH 2.820 11/30/2022    PHYSICAL EXAM:  Today's Vitals   02/21/24 1623  BP: (!) 133/95  Pulse: (!) 101  Weight: (!) 425 lb (192.8 kg)  Height: 5\' 4"  (1.626 m)   Body mass index is 72.95 kg/m.   Wt Readings from Last 3 Encounters:  02/21/24 (!) 425 lb (192.8 kg)  01/24/24 (!) 420 lb (190.5 kg)  01/13/24 (!) 420 lb (190.5 kg)      Ht Readings from Last 3 Encounters:  02/21/24 5\' 4"  (1.626 m)  01/24/24 5' 4.5" (1.638 m)  01/13/24 5' 4.5" (1.638 m)      General: The patient is awake, alert and appears not in acute distress. The patient is well groomed.  Mallampati 3,  neck circumference:18 inches . Nasal airflow  patent.  Retrognathia is not seen.  Dental status: wore braces. Cardiovascular:  Regular rate and cardiac rhythm by pulse,  without distended neck veins. Respiratory: Lungs are clear to auscultation.  Skin:  With evidence of ankle edema, dry rash in central face.  Trunk: obese   NEUROLOGIC EXAM: The patient is awake and alert, oriented to place and time.   Memory subjective described as intact.  Attention span & concentration ability appears normal.  Speech is fluent, without  dysarthria, dysphonia or aphasia.  Mood and affect are appropriate.   Cranial nerves: no loss of smell or taste reported  Pupils are equal and briskly reactive to light. Funduscopic exam def..  Extraocular movements in vertical and horizontal planes were intact and without nystagmus. No Diplopia. Visual fields by finger perimetry are intact. Hearing was intact to soft voice and finger rubbing.    Facial sensation intact to fine touch.  Facial motor strength is symmetric and tongue and uvula move midline.  Neck ROM : rotation, tilt and flexion extension were normal for age and shoulder shrug was symmetrical.    Motor exam:  Symmetric bulk, tone and ROM.   Normal tone without cog wheeling, symmetric grip strength .   Gait and station: Patient could rise unassisted from a seated position, walked without  assistive device.  Stance is of wider base .  Toe and heel walk were deferred.    Romberg - positive ,  swaying front to back. Reports feeling dizzy and seeing "all white " Deep tendon reflexes: in the  upper and lower extremities are symmetric and intact.     ASSESSMENT AND PLAN 28 y.o. year old female  here  with:  Empty sella, Idiopathic intracranial hypertension.  Obesity hypoventilation , OSA.  Migraine     1)  restarting topiramate 50 mg bid, starting first with 25 mg bid  and if headches remain poorly controlled will go to 50 mg.    2) OSA  OHV/ Continue using CPAP as it has reduced headaches.  I am glad to see her high compliance.   3)  Metabolic testing for super obesity- can we get her into a lab ay [facility]>    I plan to follow up either personally or through our NP within 6 months.   I would like to thank Hermenia Loose and Tomas de Castro, [place], [place] [address] [place],   [address] for allowing me to meet with and to take care of this pleasant patient.     After spending a total time of  35  minutes face to face and additional time for physical and neurologic examination, review of laboratory studies,  personal review of imaging studies, reports and results of other testing and review of referral information / records as far as provided in visit,   Electronically signed by: Neomia Banner, MD 02/21/2024 4:47 PM  [facility] Board certified by The [employer] of Sleep Medicine and Diplomate of the [employer] of Sleep Medicine. Board certified In Neurology through the ABPN, Fellow of the [employer] of Neurology.

## 2024-02-21 NOTE — Patient Instructions (Signed)
 ASSESSMENT AND PLAN 28 y.o. year old female  here with:     1)  restarting topiramate 50 mg bid, starting first with 25 mg bid  and if headches remain poorly controlled will go to 50 mg.     2) OSA  OHV/ Continue using CPAP as it has reduced headaches.  I am glad to see her high compliance.    3)  Metabolic testing for super obesity- can we get her into a lab ay [facility]>      I plan to follow up either personally or through our NP within 6 months.    I would like to thank Hermenia Loose and [place], [place] [address] [place],  [place] [address] for allowing me to meet with and to take care of this pleasant patient.

## 2024-02-23 ENCOUNTER — Telehealth: Payer: Self-pay | Admitting: Neurology

## 2024-02-23 NOTE — Telephone Encounter (Signed)
 Have been working on urgent referral for the patient. Contacted the [facility] through email. Received a call from Gaffer, [employer], [employer], [email or web address]. She's asking if you could reach out to her for information on what study or program for the patient and referral. She said will be glad to help or point in the right direction.   I did not relay any information about the patient.

## 2024-02-23 NOTE — Telephone Encounter (Signed)
 Attempting to get contact with [facility] for fax information to fax referral for weight management.

## 2024-02-23 NOTE — Telephone Encounter (Signed)
 Spoke with phone staff, Audiological scientist at [facility]. She said, Janyce Mercy over the metabolic lab does not have a direct line, but can email her to call you. Gave her office contact information for Janyce Mercy to call to discuss enrolling patient in the Metabolic Lab for Medical Weight Management.

## 2024-02-28 ENCOUNTER — Telehealth: Payer: Self-pay | Admitting: Neurology

## 2024-02-28 MED ORDER — SUMATRIPTAN SUCCINATE 100 MG PO TABS: 100.0000 mg | ORAL_TABLET | Freq: Once | ORAL | 2 refills | Status: DC | PRN

## 2024-02-28 NOTE — Telephone Encounter (Signed)
 Patient having a migraine, took Tylenol , but is not working. Discussed at last office visit Dr. Albertina Hugger was sending a prescription for Sumatriptan  to the pharmacy. Send to  [facility] [phone]

## 2024-02-28 NOTE — Telephone Encounter (Signed)
Medication sent in for the patient.

## 2024-02-28 NOTE — Telephone Encounter (Signed)
 Can you reach out to [employer], [facility] by email to [email or web address]. She asking for more information for the referral.

## 2024-02-29 NOTE — Telephone Encounter (Signed)
 Email sent to the email that was provided and cc'd our referral coordinator.

## 2024-03-31 NOTE — Progress Notes (Deleted)
 Office Visit Note  Patient: Jade Lopez             Date of Birth: [birth year]           MRN: [id number]             PCP: Hermenia Loose Referring: Hermenia Loose Visit Date: 04/14/2024   Subjective:  No chief complaint on file.   History of Present Illness: Elinor Kleine is a 28 y.o. female here for follow up with chronic pain in multiple areas multifactorial with FMS, thoracic spine osteoarthitis, and history of lupus recently seronegative for inflammatory arthritis.    Previous HPI 01/13/2024 Thresa Dozier is a 28 y.o. female here for follow up with chronic pain in multiple areas multifactorial with FMS, thoracic spine osteoarthitis, and history of lupus recently seronegative for inflammatory arthritis.    She experiences chronic joint pain primarily in her knees, shoulders, and back, with recent flare-ups increasing in frequency and severity. She has a history of a back fracture, which is currently stable. No new falls or injuries have occurred. No new skin rashes or changes, and no significant back pain beyond her usual discomfort.   She took hydroxychloroquine  for several months without significant improvement. She is prescribed Lyrica  at 50 mg three times a day, which she has been on for about a year. She also uses naproxen  and Tylenol  as needed for pain relief. Cyclobenzaprine  is taken occasionally but is not sure about use along with lyrica .   She experiences facial swelling and occasional sores in her mouth, specifically on the inner side of her jaw, which are sometimes painful. She uses salt water rinses to manage these sores. Swelling is noted in her face but not more than usual in her legs.   She recently felt unwell with wheezing, for which she visited her primary care provider. X-rays were normal, but she continued to feel unwell. Prednisone  was given both in the emergency room and by her primary care provider, but she did not experience significant  relief. She eventually started to feel better over time. No new respiratory infections.      Previous HPI 10/25/2023 Myriah Boggus is a 28 y.o. female here for follow up with history of lupus. They have completed a course of antibiotics for the breast infection. They were started on hydroxychloroquine  400 mg daily at the last visit to manage the joint inflammation. They report a slight improvement in symptoms, particularly in the left leg, which is now pain-free. However, they also note an increase in shoulder pain. They have experienced some nausea but are unsure if it is related to the medication. They deny any new illnesses or rashes, but mention a small light spot on the left thigh.   The patient also reports persistent pain in both shoulders, which has recently increased. The pain radiates down the arm but does not cause numbness or affect grip strength. They also report occasional itching and burning in the ears.   Previous HPI 08/30/2023 Zarria Towell is a 28 y.o. female here for follow up and with history of lupus.  Workup at our initial visit was negative for any disease specific antibodies but did have moderately elevated sedimentation rate and CRP.  Still having a lot of body pains and stiffness not localized very well to specific joints.  Still seeing leg swelling worse in the evening.  Worsening of the right breast cysts not diagnosed with abscess for which it sounds like she is going  to need some drainage or debridement.   Previous HPI 07/29/23 Felina Tello is a 28 y.o. female here for evaluation and management with history of lupus.  She was not seeing any rheumatologist previously had been managed through primary care office with prolonged oral prednisone  treatment.  From her history sounds like original diagnosis was based on joint inflammation and abnormal serology.  And more recently reviewed notes may have also experienced a course of intermittent fevers without clear underlying  cause.  Also previous diagnosis of fibromyalgia syndrome and takes Lyrica  50 mg 3 times daily.  Currently has joint pain in multiple areas mostly worse on her right side throughout upper extremity and in the right knee.  She previously fractured the right ankle does not have as much pain in the site as an others.  She gets swelling in multiple areas legs are worse by the evening time. Besides joint pain multiple areas she has some frequent or recurrent skin lesions sounds like cysts or small abscesses in the axilla groin and breast areas.  Saw dermatology evaluation at some point and found to have some type of dermatitis but was not diagnosed as hidradenitis. Has chronic eye and mouth dryness not associated with dental complications vision change or eye inflammation.  Reports some hair loss or thinning not associated with any scalp rash or specific distribution. She does have chronic fatigue, concentration difficulty, dizziness, headaches, chronic diarrhea.     12/2022 ANA neg dsDNA neg   No Rheumatology ROS completed.   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   Chronic post-traumatic headache, not intractable 10/28/2023   Hypersomnia with sleep apnea 10/28/2023   Visual changes 10/28/2023   Abnormal laboratory test result 07/29/2023   Traumatic brain injury (HCC) 07/14/2023   History of nausea and vomiting 05/19/2023   Fibromyalgia 04/11/2023   Abscess of right breast 04/03/2023   Abdominal wall pain 03/01/2023   Sleep related headaches 02/24/2023   OSA on CPAP 02/24/2023   Sleeps in sitting position due to orthopnea 02/24/2023   Menorrhagia 01/28/2023   Healthcare maintenance 01/28/2023   Chest pain 01/28/2023   Back pain 01/28/2023   Type 2 diabetes mellitus (HCC) 11/28/2022   History of systemic lupus erythematosus (HCC) 11/28/2022   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/28/2022   Endometriosis 11/28/2022    Anxiety 11/28/2022   Vitamin D  deficiency 09/13/2012   Asthma 08/22/2012    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   T2DM (type 2 diabetes mellitus) (HCC)    TOA (tubo-ovarian abscess)     Family History  Problem Relation Age of Onset   Diabetes Mother    Hypertension Mother    Migraines Mother    Diabetes Father    Hypertension Father    Sleep apnea Father    Past Surgical History:  Procedure Laterality Date   BREAST LUMPECTOMY Right 2024   CHOLECYSTECTOMY     DILATATION AND CURETTAGE/HYSTEROSCOPY WITH MINERVA     3 total   INCISION AND DRAINAGE ABSCESS  2021   buttocks   IR LUMBAR PUNCTURE  11/15/2023   RADIOLOGY WITH ANESTHESIA N/A 11/15/2023   Procedure: Lumbar Puncture;  Surgeon: Roxie Cord, MD;  Location: [facility] OR;  Service: Radiology;  Laterality: N/A;   TONSILLECTOMY AND ADENOIDECTOMY     Social History   Social History Narrative   Pt lives alone    Pt not working    Immunization History  Administered Date(s) Administered   Influenza, Seasonal, Injecte, Preservative Fre 08/08/2008   PPD Test 10/07/2022   Td 07/17/2015     Objective: Vital Signs: There were no vitals taken for this visit.   Physical Exam   Musculoskeletal Exam: ***  CDAI Exam: CDAI Score: -- Patient Global: --; Provider Global: -- Swollen: --; Tender: -- Joint Exam 04/14/2024   No joint exam has been documented for this visit   There is currently no information documented on the homunculus. Go to the Rheumatology activity and complete the homunculus joint exam.  Investigation: No additional findings.  Imaging: No results found.  Recent Labs: Lab Results  Component Value Date   WBC 9.4 01/13/2024   HGB 12.6  01/13/2024   PLT 270 01/13/2024   NA 139 01/13/2024   K 4.4 01/13/2024   CL 104 01/13/2024   CO2 27 01/13/2024   GLUCOSE 166 (H) 01/13/2024   BUN 12 01/13/2024   CREATININE 0.68 01/13/2024   BILITOT 0.3 01/13/2024   ALKPHOS 92 05/09/2023   AST 9 (L) 01/13/2024   ALT 14 01/13/2024   PROT 6.5 01/13/2024   ALBUMIN 3.3 (L) 05/09/2023   CALCIUM  9.0 01/13/2024    Speciality Comments: No specialty comments available.  Procedures:  No procedures performed Allergies: Clindamycin/lincomycin, Gabapentin, Other, Pregabalin , Cephalexin , Iodinated contrast media, Meloxicam, and Clindamycin   Assessment / Plan:     Visit Diagnoses: No diagnosis found.  ***  Orders: No orders of the defined types were placed in this encounter.  No orders of the defined types were placed in this encounter.    Follow-Up Instructions: No follow-ups on file.   Glena Landau, RT  Note - This record has been created using [employer].  Chart creation errors have been sought, but may not always  have been located. Such creation errors do not reflect on  the standard of medical care.

## 2024-04-08 ENCOUNTER — Ambulatory Visit
Admission: EM | Admit: 2024-04-08 | Discharge: 2024-04-08 | Disposition: A | Discharge status: Home / Self Care | Attending: Family Medicine | Admitting: Family Medicine

## 2024-04-08 DIAGNOSIS — L02212 Cutaneous abscess of back [any part, except buttock]: Secondary | ICD-10-CM | POA: Diagnosis present

## 2024-04-08 MED ORDER — SULFAMETHOXAZOLE-TRIMETHOPRIM 800-160 MG PO TABS: 1.0000 | ORAL_TABLET | Freq: Two times a day (BID) | ORAL | 0 refills | Status: AC

## 2024-04-08 NOTE — Discharge Instructions (Addendum)
 Keep area clean and dry.  Start Bactrim  twice daily for 7 days.  The clinic will contact you with the wound culture done today if positive.  Please follow-up with your PCP if your symptoms do not improve.  Please go to the ER for any worsening symptoms.  Hope you feel better soon!

## 2024-04-08 NOTE — ED Provider Notes (Signed)
 [facility]    CSN: [id number] Arrival date & time: 04/08/24  1523      History   Chief Complaint Chief Complaint  Patient presents with   Abscess    HPI Jade Lopez is a 28 y.o. female presents for abscess.  Patient reports 1 week of an abscess to her left mid back.  States it started to drain a couple of days ago.  Endorses subjective fevers.  Does have a history of abscess in the past as well as MRSA.  No OTC treatments have been used.  No other concerns at this time.   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   Chronic post-traumatic headache, not intractable 10/28/2023   Hypersomnia with sleep apnea 10/28/2023   Visual changes 10/28/2023   Abnormal laboratory test result 07/29/2023   Traumatic brain injury (HCC) 07/14/2023   History of nausea and vomiting 05/19/2023   Fibromyalgia 04/11/2023   Abscess of right breast 04/03/2023   Abdominal wall pain 03/01/2023   Sleep related headaches 02/24/2023   OSA on CPAP 02/24/2023   Sleeps in sitting position due to orthopnea 02/24/2023   Menorrhagia 01/28/2023   Healthcare maintenance 01/28/2023   Chest pain 01/28/2023   Back pain 01/28/2023   Type 2 diabetes mellitus (HCC) 11/28/2022   History of systemic lupus erythematosus (HCC) 11/28/2022   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/28/2022   Endometriosis 11/28/2022   Anxiety 11/28/2022   Vitamin D  deficiency 09/13/2012   Asthma 08/22/2012    Past Surgical History:  Procedure Laterality Date   BREAST LUMPECTOMY Right  2024   CHOLECYSTECTOMY     DILATATION AND CURETTAGE/HYSTEROSCOPY WITH MINERVA     3 total   INCISION AND DRAINAGE ABSCESS  2021   buttocks   IR LUMBAR PUNCTURE  11/15/2023   RADIOLOGY WITH ANESTHESIA N/A 11/15/2023   Procedure: Lumbar Puncture;  Surgeon: Roxie Cord, MD;  Location: [facility] OR;  Service: Radiology;  Laterality: N/A;   TONSILLECTOMY AND ADENOIDECTOMY      OB History     Gravida  0   Para  0   Term  0   Preterm  0   AB  0   Living  0      SAB  0   IAB  0   Ectopic  0   Multiple  0   Live Births  0            Home Medications    Prior to Admission medications   Medication Sig Start Date End Date Taking? Authorizing Provider  Accu-Chek Softclix Lancets lancets daily. 12/15/22  Yes [provider]  albuterol  (PROVENTIL ) (2.5 MG/3ML) 0.083% nebulizer solution Take 3 mLs (2.5 mg total) by nebulization every 6 (six) hours as needed for wheezing or shortness of breath. 12/08/23  Yes Alleen Arbour, NP  albuterol  (VENTOLIN  HFA) 108 (90 Base) MCG/ACT inhaler Inhale 1-2 puffs into the  lungs every 6 (six) hours as needed for wheezing or shortness of breath. 05/10/23  Yes Macdonald Savoy, MD  BIOTIN PO Take 2 capsules by mouth daily. Hair, skin nails   Yes [provider]  Blood Glucose Monitoring Suppl (MM EASY TOUCH GLUCOSE METER) w/Device KIT Please check your blood sugar daily, in the morning. 12/08/22  Yes Joette Mustard, MD  Blood Glucose Monitoring Suppl DEVI 1 each by Does not apply route 3 (three) times daily. May dispense any manufacturer covered by patient's insurance. 05/10/23  Yes Macdonald Savoy, MD  budesonide -formoterol  (SYMBICORT ) 80-4.5 MCG/ACT inhaler Inhale 2 puffs into the lungs 2 (two) times daily as needed. Patient taking differently: Inhale 2 puffs into the lungs 2 (two) times daily. 05/19/23  Yes Amoako, Prince, MD  busPIRone  (BUSPAR ) 10 MG tablet Take 3 tablets (30 mg total) by mouth daily. 11/25/22  Yes Joette Mustard, MD  Glucose Blood (BLOOD GLUCOSE TEST STRIPS) STRP 1 each by Does not apply route 3 (three) times daily. Use as directed to check blood sugar. May dispense any manufacturer covered by patient's insurance and fits patient's device. 05/10/23  Yes Macdonald Savoy, MD  HYDROcodone -acetaminophen  (NORCO/VICODIN) 5-325 MG tablet Take 1 tablet by mouth every 6 (six) hours as needed. 12/02/23  Yes Elisa Guest, PA-C  Lancet Device MISC 1 each by Does not apply route 3 (three) times daily. May dispense any manufacturer covered by patient's insurance. 05/10/23  Yes Macdonald Savoy, MD  Lancets MISC 1 each by Does not apply route 3 (three) times daily. Use as directed to check blood sugar. May dispense any manufacturer covered by patient's insurance and fits patient's device. 05/10/23  Yes Macdonald Savoy, MD  lidocaine  (LIDODERM ) 5 % Place 1 patch onto the skin daily. Remove & Discard patch within 12 hours or as directed by MD 01/25/24  Yes Kommor, Madison, MD  medroxyPROGESTERone  (DEPO-PROVERA ) 150 MG/ML injection Inject 150 mg into the muscle every 3 (three) months.   Yes [provider]  MOUNJARO  7.5 MG/0.5ML Pen Inject into the skin. 09/30/23  Yes [provider]  Multiple Vitamins-Minerals (ZINC PO) Take 1 tablet by mouth daily.   Yes [provider]  naproxen  (NAPROSYN ) 500 MG tablet Take 1 tablet (500 mg total) by mouth 2 (two) times daily with a meal. 10/31/23  Yes Adolph Hoop, PA-C  norethindrone  (AYGESTIN ) 5 MG tablet Take 10 mg by mouth at bedtime. 12/11/22  Yes [provider]  ondansetron  (ZOFRAN -ODT) 8 MG disintegrating tablet Take 1 tablet (8 mg total) by mouth every 8 (eight) hours as needed for nausea or vomiting. 05/17/23  Yes Chonte Ricke, Jodi R, NP  oxyCODONE  (ROXICODONE ) 5 MG immediate release tablet Take 1 tablet (5 mg total) by mouth every 4 (four) hours as needed for severe pain (pain score 7-10). 01/24/24  Yes Kommor, Madison, MD   oxyCODONE -acetaminophen  (PERCOCET) 10-325 MG tablet Take 1 tablet by mouth. 03/31/24  Yes [provider]  pregabalin  (LYRICA ) 50 MG capsule Take 1 capsule (50 mg total) by mouth 3 (three) times daily. 05/19/23  Yes Amoako, Prince, MD  promethazine -dextromethorphan (PROMETHAZINE -DM) 6.25-15 MG/5ML syrup Take 5 mLs by mouth 4 (four) times daily as needed for cough. 12/08/23  Yes Tymesha Ditmore, Jodi R, NP  sulfamethoxazole -trimethoprim  (BACTRIM  DS) 800-160 MG tablet Take 1 tablet by mouth 2 (two) times daily for 7 days. 04/08/24 04/15/24 Yes Tregan Read, Jodi R, NP  SUMAtriptan  (IMITREX ) 100 MG tablet Take 1 tablet (100 mg total) by mouth once  as needed for up to 1 dose for migraine (Take 1 tablet at onset. may repeat an additional tablet in 2 hours if needed (Do not exceed more than 2 tablet in 24 hrs)). 02/28/24  Yes Dohmeier, Raoul Byes, MD  tirzepatide  (MOUNJARO ) 7.5 MG/0.5ML Pen Inject 7.5 mg into the skin once a week. 01/31/24  Yes [provider]  tiZANidine (ZANAFLEX) 4 MG tablet Take 1 tablet by mouth. 03/18/24  Yes [provider]  topiramate  (TOPAMAX ) 25 MG tablet Take 1 tablet (25 mg total) by mouth 2 (two) times daily. 11/08/23  Yes Dohmeier, Raoul Byes, MD  traZODone  (DESYREL ) 300 MG tablet Take 1 tablet (300 mg total) by mouth at bedtime. 09/22/23  Yes Ulysees Gander, DO  triamcinolone ointment (KENALOG) 0.1 % Apply 1 Application topically daily at 12 noon.   Yes [provider]  Turmeric, Curcuma [place]a, (TURMERIC ROOT) POWD Take 15 mLs by mouth daily. Put in food   Yes [provider]  cyclobenzaprine  (FLEXERIL ) 10 MG tablet Take 1 tablet (10 mg total) by mouth at bedtime. 01/13/24   Rice, Haig Levan, MD    Family History Family History  Problem Relation Age of Onset   Diabetes Mother    Hypertension Mother    Migraines Mother    Diabetes Father    Hypertension Father    Sleep apnea Father     Social History Social History   Tobacco Use   Smoking status:  Never    Passive exposure: Never   Smokeless tobacco: Never  Vaping Use   Vaping status: Never Used  Substance Use Topics   Alcohol use: Yes    Comment: occasional   Drug use: No     Allergies   Clindamycin/lincomycin, Gabapentin, Other, Pregabalin , Cephalexin , Iodinated contrast media, Meloxicam, and Clindamycin   Review of Systems Review of Systems  Skin:  Positive for wound.     Physical Exam Triage Vital Signs ED Triage Vitals  Encounter Vitals Group     BP 04/08/24 1533 134/83     Systolic BP Percentile --      Diastolic BP Percentile --      Pulse Rate 04/08/24 1533 (!) 104     Resp 04/08/24 1533 17     Temp 04/08/24 1533 98.8 F (37.1 C)     Temp Source 04/08/24 1533 Oral     SpO2 04/08/24 1533 97 %     Weight 04/08/24 1531 (!) 420 lb (190.5 kg)     Height 04/08/24 1531 5' 4.5" (1.638 m)     Head Circumference --      Peak Flow --      Pain Score 04/08/24 1531 0     Pain Loc --      Pain Education --      Exclude from Growth Chart --    No data found.  Updated Vital Signs BP 134/83 (BP Location: Right Arm)   Pulse (!) 104   Temp 98.8 F (37.1 C) (Oral)   Resp 17   Ht 5' 4.5" (1.638 m)   Wt (!) 420 lb (190.5 kg)   SpO2 97%   BMI 70.98 kg/m   Visual Acuity Right Eye Distance:   Left Eye Distance:   Bilateral Distance:    Right Eye Near:   Left Eye Near:    Bilateral Near:     Physical Exam Vitals and nursing note reviewed.  Constitutional:      Appearance: Normal appearance.  HENT:     Head:  Normocephalic and atraumatic.  Eyes:     Pupils: Pupils are equal, round, and reactive to light.  Cardiovascular:     Rate and Rhythm: Tachycardia present.     Comments: Heart rate 104, likely secondary to deconditioning Pulmonary:     Effort: Pulmonary effort is normal.  Skin:    General: Skin is warm and dry.          Comments: There is a nonfluctuant mildly indurated abscess to the left mid back between skin folds.  It is actively  draining clear/yellow liquid but no purulent drainage.  No erythema or warmth.  Neurological:     General: No focal deficit present.     Mental Status: She is alert and oriented to person, place, and time.  Psychiatric:        Mood and Affect: Mood normal.        Behavior: Behavior normal.      UC Treatments / Results  Labs (all labs ordered are listed, but only abnormal results are displayed) Labs Reviewed  AEROBIC CULTURE W GRAM STAIN (SUPERFICIAL SPECIMEN)    EKG   Radiology No results found.  Procedures Procedures (including critical care time)  Medications Ordered in UC Medications - No data to display  Initial Impression / Assessment and Plan / UC Course  I have reviewed the triage vital signs and the nursing notes.  Pertinent labs & imaging results that were available during my care of the patient were reviewed by me and considered in my medical decision making (see chart for details).     Reviewed exam and symptoms with patient.  No red flags.  She requested wound culture which was obtained and will contact for any positive results.  Abscess is already draining, advised to keep clean and dry and will start Bactrim .  Advised PCP follow-up if symptoms do not improve.  ER precautions reviewed. Final Clinical Impressions(s) / UC Diagnoses   Final diagnoses:  Abscess of back     Discharge Instructions      Keep area clean and dry.  Start Bactrim  twice daily for 7 days.  The clinic will contact you with the wound culture done today if positive.  Please follow-up with your PCP if your symptoms do not improve.  Please go to the ER for any worsening symptoms.  Hope you feel better soon!  ED Prescriptions     Medication Sig Dispense Auth. Provider   sulfamethoxazole -trimethoprim  (BACTRIM  DS) 800-160 MG tablet Take 1 tablet by mouth 2 (two) times daily for 7 days. 14 tablet Dallis Darden, Jodi R, NP      PDMP not reviewed this encounter.   Alleen Arbour, NP 04/08/24  [phone]

## 2024-04-08 NOTE — ED Triage Notes (Signed)
 Pt states that she has a abscess on her back x1 week

## 2024-04-12 ENCOUNTER — Ambulatory Visit: Payer: Self-pay | Admitting: Nurse Practitioner

## 2024-04-13 LAB — AEROBIC CULTURE W GRAM STAIN (SUPERFICIAL SPECIMEN)

## 2024-04-14 ENCOUNTER — Ambulatory Visit: Admitting: Internal Medicine

## 2024-04-14 DIAGNOSIS — M0600 Rheumatoid arthritis without rheumatoid factor, unspecified site: Secondary | ICD-10-CM

## 2024-04-14 DIAGNOSIS — Z79899 Other long term (current) drug therapy: Secondary | ICD-10-CM

## 2024-04-14 DIAGNOSIS — M797 Fibromyalgia: Secondary | ICD-10-CM

## 2024-04-24 ENCOUNTER — Ambulatory Visit: Payer: Medicaid Other | Admitting: Internal Medicine

## 2024-04-27 ENCOUNTER — Encounter: Payer: Self-pay | Admitting: Internal Medicine

## 2024-04-27 ENCOUNTER — Ambulatory Visit: Attending: Internal Medicine | Admitting: Internal Medicine

## 2024-04-27 VITALS — BP 119/72 | HR 96 | Resp 16 | Ht 64.5 in | Wt >= 6400 oz

## 2024-04-27 DIAGNOSIS — M0600 Rheumatoid arthritis without rheumatoid factor, unspecified site: Secondary | ICD-10-CM

## 2024-04-27 DIAGNOSIS — M797 Fibromyalgia: Secondary | ICD-10-CM | POA: Diagnosis not present

## 2024-04-27 DIAGNOSIS — L309 Dermatitis, unspecified: Secondary | ICD-10-CM

## 2024-04-27 MED ORDER — PREGABALIN 50 MG PO CAPS: 50.0000 mg | ORAL_CAPSULE | Freq: Three times a day (TID) | ORAL | 2 refills | Status: DC

## 2024-04-27 MED ORDER — DOXYCYCLINE HYCLATE 100 MG PO CAPS: 100.0000 mg | ORAL_CAPSULE | Freq: Two times a day (BID) | ORAL | 2 refills | Status: DC

## 2024-04-27 NOTE — Progress Notes (Signed)
 Office Visit Note  Patient: Jade Lopez             Date of Birth: May 25, 1996           MRN: [id number]             PCP: Forest Idol, NP Referring: Virl Grimes, PA-C Visit Date: 04/27/2024   Subjective:  Follow-up (Patient states she was advised to have her lyrica , she can only take brand name, taken over by her rheumatology or pain management and pain management is taking to long. )   Discussed the use of AI scribe software for clinical note transcription with the patient, who gave verbal consent to proceed.  History of Present Illness   Jade Lopez is a 28 y.o. female here for follow up with chronic pain in multiple areas multifactorial with FMS, thoracic spine osteoarthitis, and history of lupus recently seronegative for inflammatory arthritis. She presents with recurrent skin rashes and asthma who presents with weakness and body aches.  She has been experiencing weakness and body aches for the past two to three days, which she attributes to a flare-up of her condition rather than an acute illness. No recent history of upper respiratory or viral illnesses.  She has a history of recurrent skin rashes, primarily affecting the center of her face, causing burning sensations described as 'like somebody lit my face on fire.' She has been documenting these rashes with photographs and has been taking antibiotics for skin abscesses. The rashes also appear elsewhere but are less severe. Currently, there are no new rashes under her arms or on her abdomen.  She experiences swelling in her feet and hands, which she describes as typical for her condition. No color changes in her hands or fingers.  Her asthma symptoms have been worsening, potentially related to mold in her apartment. She has consulted an allergist, who suggested allergies might be a factor, although she does not typically have issues with pollen. Further testing for mold exposure is pending.       Previous  HPI 01/13/2024 Jade Lopez is a 28 y.o. female here for follow up with chronic pain in multiple areas multifactorial with FMS, thoracic spine osteoarthitis, and history of lupus recently seronegative for inflammatory arthritis.    She experiences chronic joint pain primarily in her knees, shoulders, and back, with recent flare-ups increasing in frequency and severity. She has a history of a back fracture, which is currently stable. No new falls or injuries have occurred. No new skin rashes or changes, and no significant back pain beyond her usual discomfort.   She took hydroxychloroquine  for several months without significant improvement. She is prescribed Lyrica  at 50 mg three times a day, which she has been on for about a year. She also uses naproxen  and Tylenol  as needed for pain relief. Cyclobenzaprine  is taken occasionally but is not sure about use along with lyrica .   She experiences facial swelling and occasional sores in her mouth, specifically on the inner side of her jaw, which are sometimes painful. She uses salt water rinses to manage these sores. Swelling is noted in her face but not more than usual in her legs.   She recently felt unwell with wheezing, for which she visited her primary care provider. X-rays were normal, but she continued to feel unwell. Prednisone  was given both in the emergency room and by her primary care provider, but she did not experience significant relief. She eventually started to feel better over time. No  new respiratory infections.      Previous HPI 10/25/2023 Jade Lopez is a 28 y.o. female here for follow up with history of lupus. They have completed a course of antibiotics for the breast infection. They were started on hydroxychloroquine  400 mg daily at the last visit to manage the joint inflammation. They report a slight improvement in symptoms, particularly in the left leg, which is now pain-free. However, they also note an increase in shoulder pain.  They have experienced some nausea but are unsure if it is related to the medication. They deny any new illnesses or rashes, but mention a small light spot on the left thigh.   The patient also reports persistent pain in both shoulders, which has recently increased. The pain radiates down the arm but does not cause numbness or affect grip strength. They also report occasional itching and burning in the ears.   Previous HPI 08/30/2023 Jade Lopez is a 28 y.o. female here for follow up and with history of lupus.  Workup at our initial visit was negative for any disease specific antibodies but did have moderately elevated sedimentation rate and CRP.  Still having a lot of body pains and stiffness not localized very well to specific joints.  Still seeing leg swelling worse in the evening.  Worsening of the right breast cysts not diagnosed with abscess for which it sounds like she is going to need some drainage or debridement.   Previous HPI 07/29/23 Jade Lopez is a 28 y.o. female here for evaluation and management with history of lupus.  She was not seeing any rheumatologist previously had been managed through primary care office with prolonged oral prednisone  treatment.  From her history sounds like original diagnosis was based on joint inflammation and abnormal serology.  And more recently reviewed notes may have also experienced a course of intermittent fevers without clear underlying cause.  Also previous diagnosis of fibromyalgia syndrome and takes Lyrica  50 mg 3 times daily.  Currently has joint pain in multiple areas mostly worse on her right side throughout upper extremity and in the right knee.  She previously fractured the right ankle does not have as much pain in the site as an others.  She gets swelling in multiple areas legs are worse by the evening time. Besides joint pain multiple areas she has some frequent or recurrent skin lesions sounds like cysts or small abscesses in the axilla groin  and breast areas.  Saw dermatology evaluation at some point and found to have some type of dermatitis but was not diagnosed as hidradenitis. Has chronic eye and mouth dryness not associated with dental complications vision change or eye inflammation.  Reports some hair loss or thinning not associated with any scalp rash or specific distribution. She does have chronic fatigue, concentration difficulty, dizziness, headaches, chronic diarrhea.     12/2022 ANA neg dsDNA neg   Review of Systems  Constitutional:  Positive for fatigue.  HENT:  Positive for mouth dryness. Negative for mouth sores.   Eyes:  Positive for dryness.  Respiratory:  Positive for shortness of breath.   Cardiovascular:  Positive for chest pain. Negative for palpitations.  Gastrointestinal:  Positive for diarrhea. Negative for blood in stool and constipation.  Endocrine: Negative for increased urination.  Genitourinary:  Negative for involuntary urination.  Musculoskeletal:  Positive for joint pain, joint pain, joint swelling, myalgias, muscle weakness, morning stiffness, muscle tenderness and myalgias. Negative for gait problem.  Skin:  Positive for sensitivity to sunlight. Negative for  color change, rash and hair loss.  Allergic/Immunologic: Positive for susceptible to infections.  Neurological:  Positive for dizziness and headaches.  Hematological:  Positive for swollen glands.  Psychiatric/Behavioral:  Positive for depressed mood and sleep disturbance. The patient is nervous/anxious.     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   Chronic post-traumatic headache, not intractable 10/28/2023   Hypersomnia with sleep apnea 10/28/2023   Visual changes 10/28/2023   Abnormal laboratory test result 07/29/2023   Traumatic brain injury (HCC) 07/14/2023   History of nausea and vomiting 05/19/2023   Fibromyalgia 04/11/2023    Abscess of right breast 04/03/2023   Abdominal wall pain 03/01/2023   Sleep related headaches 02/24/2023   OSA on CPAP 02/24/2023   Sleeps in sitting position due to orthopnea 02/24/2023   Menorrhagia 01/28/2023   Healthcare maintenance 01/28/2023   Chest pain 01/28/2023   Back pain 01/28/2023   Type 2 diabetes mellitus (HCC) 11/28/2022   History of systemic lupus erythematosus (HCC) 11/28/2022   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/28/2022   Endometriosis 11/28/2022   Anxiety 11/28/2022   Vitamin D  deficiency 09/13/2012   Asthma 08/22/2012    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   T2DM (type 2 diabetes mellitus) (HCC)    TOA (tubo-ovarian abscess)     Family History  Problem Relation Age of Onset   Diabetes Mother    Hypertension Mother    Migraines Mother    Diabetes Father    Hypertension Father    Sleep apnea Father    Past Surgical History:  Procedure Laterality Date   BREAST LUMPECTOMY Right 2024   CHOLECYSTECTOMY     DILATATION AND CURETTAGE/HYSTEROSCOPY WITH MINERVA     3 total   INCISION AND DRAINAGE ABSCESS  2021   buttocks   IR LUMBAR PUNCTURE  11/15/2023   RADIOLOGY WITH ANESTHESIA N/A 11/15/2023   Procedure: Lumbar Puncture;  Surgeon: Roxie Cord, MD;  Location: [facility] OR;  Service: Radiology;  Laterality: N/A;   TONSILLECTOMY AND ADENOIDECTOMY     Social History   Social History Narrative   Pt lives alone    Pt not working    Immunization History  Administered Date(s) Administered   Influenza, Seasonal, Injecte, Preservative Fre 08/08/2008   PPD Test 10/07/2022   Td 07/17/2015     Objective: Vital Signs: BP 119/72 (BP Location: Right Arm, Patient Position: Sitting, Cuff Size: Normal)   Pulse 96   Resp 16    Ht 5' 4.5 (1.638 m)   Wt (!) 415 lb (188.2 kg)   BMI 70.13 kg/m    Physical Exam Constitutional:      Appearance: She is obese.   Eyes:     Conjunctiva/sclera: Conjunctivae normal.    Cardiovascular:     Rate and Rhythm: Normal rate and regular rhythm.  Pulmonary:     Effort: Pulmonary effort is normal.     Breath sounds: Normal breath sounds.  Lymphadenopathy:     Cervical: No cervical adenopathy.   Skin:    General: Skin is warm and dry.  Findings: Rash present.     Comments: Patches of scaly skin along medial border of eyebrows, on cheeks, no erythema   Neurological:     Mental Status: She is alert.   Psychiatric:        Mood and Affect: Mood normal.      Musculoskeletal Exam:  Shoulders full ROM some pain with movement but intact, no focal tenderness Elbows full ROM no tenderness or swelling Wrists full ROM no tenderness or swelling Fingers full ROM no tenderness or swelling Mid back and low back tenderness to pressure, worst midline and right side Knees full ROM no tenderness or swelling, right knee patellofemoral crepitus  Investigation: No additional findings.  Imaging: No results found.  Recent Labs: Lab Results  Component Value Date   WBC 9.4 01/13/2024   HGB 12.6 01/13/2024   PLT 270 01/13/2024   NA 139 01/13/2024   K 4.4 01/13/2024   CL 104 01/13/2024   CO2 27 01/13/2024   GLUCOSE 166 (H) 01/13/2024   BUN 12 01/13/2024   CREATININE 0.68 01/13/2024   BILITOT 0.3 01/13/2024   ALKPHOS 92 05/09/2023   AST 9 (L) 01/13/2024   ALT 14 01/13/2024   PROT 6.5 01/13/2024   ALBUMIN 3.3 (L) 05/09/2023   CALCIUM  9.0 01/13/2024    Speciality Comments: No specialty comments available.  Procedures:  No procedures performed Allergies: Clindamycin/lincomycin, Gabapentin, Other, Pregabalin , Cephalexin , Iodinated contrast media, Meloxicam, and Clindamycin   Assessment / Plan:     Visit Diagnoses: Seronegative rheumatoid arthritis (HCC) - Plan:  doxycycline (VIBRAMYCIN) 100 MG capsule, Sedimentation rate, C-reactive protein, C3 and C4 Chronic swelling likely due to an inflammatory condition. High inflammation levels without specific lupus or rheumatoid arthritis markers. Working Dx seronegative RA or UCTD given substantial serum inflammatory markers and intermittent skin inflammation. - Prescribed doxycycline for anti-inflammatory effects. Cautioned about photosensitivity; advised SPF 50 or 70 sunscreen use. - Recheck inflammatory markers to assess symptom flare-up.  Dermatitis - Plan: doxycycline (VIBRAMYCIN) 100 MG capsule Ongoing rashes, varying severity. Skin abscesses improved with oral abtx. Apparently not suspected for HS on prior derm eval, seborrheic dermatitis.  Fibromyalgia - Plan: pregabalin  (LYRICA ) 50 MG capsule Continuing on Lyrica  50 mg TID Reports needing brand with drug reaction to generic formulation previously.  Skin abscess Skin abscesses treated with antibiotics. Avoiding immunosuppressive medications due to infection risk.      Orders: Orders Placed This Encounter  Procedures   Sedimentation rate   C-reactive protein   C3 and C4   Meds ordered this encounter  Medications   doxycycline (VIBRAMYCIN) 100 MG capsule    Sig: Take 1 capsule (100 mg total) by mouth 2 (two) times daily.    Dispense:  60 capsule    Refill:  2   pregabalin  (LYRICA ) 50 MG capsule    Sig: Take 1 capsule (50 mg total) by mouth 3 (three) times daily.    Dispense:  90 capsule    Refill:  2    Needs brand-specific Lyrica      Follow-Up Instructions: Return in about 10 weeks (around 07/06/2024) for RA/?SLE/FMS on LYR/doxy f/u 2-70mos.   Matt Song, MD  Note - This record has been created using [employer].  Chart creation errors have been sought, but may not always  have been located. Such creation errors do not reflect on  the standard of medical care.

## 2024-04-28 LAB — C3 AND C4
C3 Complement: 225 mg/dL — ABNORMAL HIGH (ref 83–193)
C4 Complement: 32 mg/dL (ref 15–57)

## 2024-04-28 LAB — SEDIMENTATION RATE: Sed Rate: 62 mm/h — ABNORMAL HIGH (ref 0–20)

## 2024-04-28 LAB — C-REACTIVE PROTEIN: CRP: 26.9 mg/L — ABNORMAL HIGH (ref <8.0)

## 2024-05-21 ENCOUNTER — Emergency Department (HOSPITAL_COMMUNITY)
Admission: EM | Admit: 2024-05-21 | Discharge: 2024-05-21 | Disposition: A | Discharge status: Home / Self Care | Attending: Emergency Medicine | Admitting: Emergency Medicine

## 2024-05-21 ENCOUNTER — Encounter (HOSPITAL_COMMUNITY): Payer: Self-pay | Admitting: Emergency Medicine

## 2024-05-21 ENCOUNTER — Other Ambulatory Visit: Payer: Self-pay

## 2024-05-21 ENCOUNTER — Emergency Department (HOSPITAL_COMMUNITY)

## 2024-05-21 DIAGNOSIS — R040 Epistaxis: Secondary | ICD-10-CM | POA: Insufficient documentation

## 2024-05-21 DIAGNOSIS — E119 Type 2 diabetes mellitus without complications: Secondary | ICD-10-CM | POA: Insufficient documentation

## 2024-05-21 DIAGNOSIS — K920 Hematemesis: Secondary | ICD-10-CM | POA: Insufficient documentation

## 2024-05-21 DIAGNOSIS — R0602 Shortness of breath: Secondary | ICD-10-CM | POA: Diagnosis present

## 2024-05-21 DIAGNOSIS — J45909 Unspecified asthma, uncomplicated: Secondary | ICD-10-CM | POA: Diagnosis not present

## 2024-05-21 LAB — COMPREHENSIVE METABOLIC PANEL WITH GFR
ALT: 17 U/L (ref 0–44)
AST: 14 U/L — ABNORMAL LOW (ref 15–41)
Albumin: 3.6 g/dL (ref 3.5–5.0)
Alkaline Phosphatase: 98 U/L (ref 38–126)
Anion gap: 11 (ref 5–15)
BUN: 8 mg/dL (ref 6–20)
CO2: 24 mmol/L (ref 22–32)
Calcium: 9.2 mg/dL (ref 8.9–10.3)
Chloride: 103 mmol/L (ref 98–111)
Creatinine, Ser: 1.04 mg/dL — ABNORMAL HIGH (ref 0.44–1.00)
GFR, Estimated: >60 mL/min (ref >60)
Glucose, Bld: 173 mg/dL — ABNORMAL HIGH (ref 70–99)
Potassium: 3.8 mmol/L (ref 3.5–5.1)
Sodium: 138 mmol/L (ref 135–145)
Total Bilirubin: 0.7 mg/dL (ref 0.0–1.2)
Total Protein: 7.7 g/dL (ref 6.5–8.1)

## 2024-05-21 LAB — CBC
HCT: 43.4 % (ref 36.0–46.0)
Hemoglobin: 13.4 g/dL (ref 12.0–15.0)
MCH: 26.3 pg (ref 26.0–34.0)
MCHC: 30.9 g/dL (ref 30.0–36.0)
MCV: 85.1 fL (ref 80.0–100.0)
Platelets: 285 K/uL (ref 150–400)
RBC: 5.1 MIL/uL (ref 3.87–5.11)
RDW: 14 % (ref 11.5–15.5)
WBC: 11 K/uL — ABNORMAL HIGH (ref 4.0–10.5)
nRBC: 0 % (ref 0.0–0.2)

## 2024-05-21 LAB — LIPASE, BLOOD: Lipase: 34 U/L (ref 11–51)

## 2024-05-21 LAB — TROPONIN I (HIGH SENSITIVITY): Troponin I (High Sensitivity): 2 ng/L (ref <18)

## 2024-05-21 LAB — HCG, SERUM, QUALITATIVE: Preg, Serum: NEGATIVE

## 2024-05-21 MED ORDER — ONDANSETRON 4 MG PO TBDP
4.0000 mg | ORAL_TABLET | Freq: Once | ORAL | Status: AC
  Administered 2024-05-21: 4 mg via ORAL
  Filled 2024-05-21: qty 1

## 2024-05-21 MED ORDER — OXYCODONE-ACETAMINOPHEN 5-325 MG PO TABS
1.0000 | ORAL_TABLET | Freq: Once | ORAL | Status: AC
  Administered 2024-05-21: 1 via ORAL
  Filled 2024-05-21: qty 1

## 2024-05-21 MED ORDER — FAMOTIDINE 20 MG PO TABS
20.0000 mg | ORAL_TABLET | Freq: Once | ORAL | Status: AC
  Administered 2024-05-21: 20 mg via ORAL
  Filled 2024-05-21: qty 1

## 2024-05-21 NOTE — ED Notes (Signed)
 Unable to obtain IV access  USIV requested

## 2024-05-21 NOTE — ED Provider Notes (Signed)
 [facility]MENT AT [facility] Provider Note   CSN: [id number] Arrival date & time: 05/21/24  1722     Patient presents with: Hematemesis and Epistaxis   Jade Lopez is a 28 y.o. female with past medical history significant for asthma, diabetes, morbid obesity, lupus who presents with multiple complaints today.  Primarily she reports that she had some bright red vomiting associated with nosebleed.  She did not feel blood coming down the back of her throat but then started having hematemesis after which she was having some bleeding from the nose.  She reports bleeding resolved at this time, denies any ongoing nausea.  She does endorse some abdominal pain which she reports that she has been experiencing for a few days.  She reports that her rheumatologist was trying to get her worked up with a CT to assess for any abdominal abnormalities related to her lupus.  She also endorses some shortness of breath, chest pain since the vomiting.  Denies any fever, chills.    Epistaxis      Prior to Admission medications   Medication Sig Start Date End Date Taking? Authorizing Provider  Accu-Chek Softclix Lancets lancets daily. 12/15/22   [provider]  albuterol  (PROVENTIL ) (2.5 MG/3ML) 0.083% nebulizer solution Take 3 mLs (2.5 mg total) by nebulization every 6 (six) hours as needed for wheezing or shortness of breath. 12/08/23   Loreda Myla SAUNDERS, NP  albuterol  (VENTOLIN  HFA) 108 (90 Base) MCG/ACT inhaler Inhale 1-2 puffs into the lungs every 6 (six) hours as needed for wheezing or shortness of breath. 05/10/23   Odell Celinda Balo, MD  BIOTIN PO Take 2 capsules by mouth daily. Hair, skin nails Patient not taking: Reported on 04/27/2024    [provider]  Blood Glucose Monitoring Suppl (MM EASY TOUCH GLUCOSE METER) w/Device KIT Please check your blood sugar daily, in the morning. 12/08/22   Franchot Novel, MD  Blood Glucose Monitoring Suppl DEVI 1 each by  Does not apply route 3 (three) times daily. May dispense any manufacturer covered by patient's insurance. 05/10/23   Odell Celinda Balo, MD  budesonide -formoterol  (SYMBICORT ) 80-4.5 MCG/ACT inhaler Inhale 2 puffs into the lungs 2 (two) times daily as needed. 05/19/23   Amoako, Prince, MD  busPIRone  (BUSPAR ) 10 MG tablet Take 3 tablets (30 mg total) by mouth daily. Patient not taking: Reported on 04/27/2024 11/25/22   Franchot Novel, MD  cetirizine (ZYRTEC) 10 MG tablet Take 10 mg by mouth daily. 04/06/24   [provider]  cyclobenzaprine  (FLEXERIL ) 10 MG tablet Take 1 tablet (10 mg total) by mouth at bedtime. Patient not taking: Reported on 04/27/2024 01/13/24   Jeannetta Lonni ORN, MD  doxycycline  (VIBRAMYCIN ) 100 MG capsule Take 1 capsule (100 mg total) by mouth 2 (two) times daily. 04/27/24   Rice, Lonni ORN, MD  Glucose Blood (BLOOD GLUCOSE TEST STRIPS) STRP 1 each by Does not apply route 3 (three) times daily. Use as directed to check blood sugar. May dispense any manufacturer covered by patient's insurance and fits patient's device. 05/10/23   Odell Celinda Balo, MD  HYDROcodone -acetaminophen  (NORCO/VICODIN) 5-325 MG tablet Take 1 tablet by mouth every 6 (six) hours as needed. Patient not taking: Reported on 04/27/2024 12/02/23   Logan Ubaldo KATHEE Lopez  Lancet Device MISC 1 each by Does not apply route 3 (three) times daily. May dispense any manufacturer covered by patient's insurance. 05/10/23   Odell Celinda Balo, MD  Lancets MISC 1 each by Does not apply  route 3 (three) times daily. Use as directed to check blood sugar. May dispense any manufacturer covered by patient's insurance and fits patient's device. 05/10/23   Odell Celinda Balo, MD  lidocaine  (LIDODERM ) 5 % Place 1 patch onto the skin daily. Remove & Discard patch within 12 hours or as directed by MD Patient not taking: Reported on 04/27/2024 01/25/24   Kommor, Lum, MD  medroxyPROGESTERone  (DEPO-PROVERA ) 150 MG/ML injection  Inject 150 mg into the muscle every 3 (three) months.    [provider]  MOUNJARO  10 MG/0.5ML Pen SMARTSIG:10 Milligram(s) SUB-Q Once a Week 04/20/24   [provider]  MOUNJARO  7.5 MG/0.5ML Pen Inject into the skin. Patient not taking: Reported on 04/27/2024 09/30/23   [provider]  Multiple Vitamins-Minerals (ZINC PO) Take 1 tablet by mouth daily. Patient not taking: Reported on 04/27/2024    [provider]  naloxone  (NARCAN ) nasal spray 4 mg/0.1 mL SMARTSIG:Both Nares 03/31/24   [provider]  naproxen  (NAPROSYN ) 500 MG tablet Take 1 tablet (500 mg total) by mouth 2 (two) times daily with a meal. Patient not taking: Reported on 04/27/2024 10/31/23   Christopher Savannah, PA-C  norethindrone  (AYGESTIN ) 5 MG tablet Take 10 mg by mouth at bedtime. Patient not taking: Reported on 04/27/2024 12/11/22   [provider]  nystatin (MYCOSTATIN) 100000 UNIT/ML suspension Take 5 mLs by mouth 4 (four) times daily. 04/06/24   [provider]  ondansetron  (ZOFRAN -ODT) 8 MG disintegrating tablet Take 1 tablet (8 mg total) by mouth every 8 (eight) hours as needed for nausea or vomiting. 05/17/23   Mayer, Jodi R, NP  oxyCODONE  (ROXICODONE ) 5 MG immediate release tablet Take 1 tablet (5 mg total) by mouth every 4 (four) hours as needed for severe pain (pain score 7-10). Patient not taking: Reported on 04/27/2024 01/24/24   Kommor, Lum, MD  oxyCODONE -acetaminophen  (PERCOCET) 10-325 MG tablet Take 1 tablet by mouth. 03/31/24   [provider]  pregabalin  (LYRICA ) 50 MG capsule Take 1 capsule (50 mg total) by mouth 3 (three) times daily. 04/27/24   Rice, Lonni ORN, MD  promethazine -dextromethorphan (PROMETHAZINE -DM) 6.25-15 MG/5ML syrup Take 5 mLs by mouth 4 (four) times daily as needed for cough. Patient not taking: Reported on 04/27/2024 12/08/23   Mayer, Jodi R, NP  SUMAtriptan  (IMITREX ) 100 MG tablet Take 1 tablet (100 mg total) by mouth once as  needed for up to 1 dose for migraine (Take 1 tablet at onset. may repeat an additional tablet in 2 hours if needed (Do not exceed more than 2 tablet in 24 hrs)). 02/28/24   Dohmeier, Dedra, MD  tirzepatide  (MOUNJARO ) 7.5 MG/0.5ML Pen Inject 7.5 mg into the skin once a week. Patient not taking: Reported on 04/27/2024 01/31/24   [provider]  tiZANidine (ZANAFLEX) 4 MG tablet Take 1 tablet by mouth. Patient not taking: Reported on 04/27/2024 03/18/24   [provider]  topiramate  (TOPAMAX ) 25 MG tablet Take 1 tablet (25 mg total) by mouth 2 (two) times daily. Patient not taking: Reported on 04/27/2024 11/08/23   Dohmeier, Dedra, MD  traZODone  (DESYREL ) 300 MG tablet Take 1 tablet (300 mg total) by mouth at bedtime. Patient not taking: Reported on 04/27/2024 09/22/23   Leonce Katz, DO  triamcinolone ointment (KENALOG) 0.1 % Apply 1 Application topically daily at 12 noon.    [provider]  Turmeric, Curcuma [place], (TURMERIC ROOT) POWD Take 15 mLs by mouth daily. Put in food    [provider]  Allergies: Clindamycin/lincomycin, Gabapentin, Other, Pregabalin , Cephalexin , Iodinated contrast media, Meloxicam, and Clindamycin    Review of Systems  HENT:  Positive for nosebleeds.   All other systems reviewed and are negative.   Updated Vital Signs BP (!) 139/95 (BP Location: Left Wrist)   Pulse (!) 116   Temp 98.6 F (37 C) (Oral)   Resp 15   Ht 5' 4.5 (1.638 m)   Wt (!) 189.6 kg   SpO2 100%   BMI 70.64 kg/m   Physical Exam Vitals and nursing note reviewed.  Constitutional:      General: She is not in acute distress.    Appearance: Normal appearance.  HENT:     Head: Normocephalic and atraumatic.     Nose:     Comments: No active nosebleed anteriorly or posteriorly on intranasal exam    Mouth/Throat:     Comments: No bleeding in posterior oropharynx Eyes:     General:        Right eye: No discharge.        Left eye: No discharge.   Cardiovascular:     Rate and Rhythm: Normal rate and regular rhythm.  Pulmonary:     Effort: Pulmonary effort is normal. No respiratory distress.     Comments: No wheezing, rhonchi, stridor, rales. Abdominal:     Comments: Diffuse tenderness throughout the abdomen, most focally in the left upper and right upper quadrant region, no rebound, rigidity, guarding, negative Murphy sign.  Musculoskeletal:        General: No deformity.  Skin:    General: Skin is warm and dry.  Neurological:     Mental Status: She is alert and oriented to person, place, and time.  Psychiatric:        Mood and Affect: Mood normal.        Behavior: Behavior normal.     (all labs ordered are listed, but only abnormal results are displayed) Labs Reviewed  LIPASE, BLOOD  COMPREHENSIVE METABOLIC PANEL WITH GFR  CBC  HCG, SERUM, QUALITATIVE  TROPONIN I (HIGH SENSITIVITY)    EKG: None  Radiology: DG Chest Portable 1 View Result Date: 05/21/2024 CLINICAL DATA:  Vomiting blood, chest pain EXAM: PORTABLE CHEST 1 VIEW COMPARISON:  Radiograph and CT 12/01/2023 FINDINGS: The heart size and mediastinal contours are within normal limits. Both lungs are clear. The visualized skeletal structures are unremarkable. IMPRESSION: No active disease. Electronically Signed   By: Norman Gatlin M.D.   On: 05/21/2024 19:10     Procedures   Medications Ordered in the ED  oxyCODONE -acetaminophen  (PERCOCET/ROXICET) 5-325 MG per tablet 1 tablet (1 tablet Oral Given 05/21/24 1902)  ondansetron  (ZOFRAN -ODT) disintegrating tablet 4 mg (4 mg Oral Given 05/21/24 1902)    Clinical Course as of 05/21/24 1939  Sun May 21, 2024  1920 Came in for hematemesis and epistaxis. CT for upper abdominal pain.  Likely dc. [JR]    Clinical Course User Index [JR] Lang Norleen POUR, PA-C                                 Medical Decision Making Amount and/or Complexity of Data Reviewed Labs: ordered. Radiology: ordered.  Risk Prescription  drug management.   This an overall well-appearing 28 year old female with past medical history significant for asthma, diabetes, morbid obesity, lupus who came in for multiple problems today including hematemesis after epistaxis, upper abdominal pain, chest pain, shortness of breath.  My  emergent differential diagnosis includes hematemesis secondary to epistaxis, with blood irritating her stomach, versus The causes of generalized abdominal pain include but are not limited to AAA, mesenteric ischemia, appendicitis, diverticulitis, DKA, gastritis, gastroenteritis, AMI, nephrolithiasis, pancreatitis, peritonitis, adrenal insufficiency,lead poisoning, iron toxicity, intestinal ischemia, constipation, UTI,SBO/LBO, splenic rupture, biliary disease, IBD, IBS, PUD, or hepatitis. ACS, AAS, PE, Mallory-Weiss, Boerhaave's, Pneumonia, acute bronchitis, asthma or COPD exacerbation, anxiety, MSK pain or traumatic injury to the chest, acid reflux versus other.  Plain film chest x-ray with no evidence of acute intrathoracic abnormality.  Lab work, CT of the abdomen is pending at time of handoff.  EKG shows sinus tachycardia, otherwise unremarkable.  Administered Zofran , and Percocet for pain.  7:39 PM Care of Jade Lopez transferred to [facility] Norleen Essex and Dr. Patt at the end of my shift as the patient will require reassessment once labs/imaging have resulted. Patient presentation, ED course, and plan of care discussed with review of all pertinent labs and imaging. Please see his/her note for further details regarding further ED course and disposition. Plan at time of handoff is likely stable for discharge if no acute findings noted on CT, suspect that her hematemesis was secondary to swallowing epistaxis blood, epistaxis is resolved at this time, she has no longer feeling like she needs to vomit just some lingering mild nausea. This may be altered or completely changed at the discretion of the oncoming team pending  results of further workup.  Final diagnoses:  None    ED Discharge Orders     None          Jade Lopez 05/21/24 1939    Franklyn Sid SAILOR, MD 05/22/24 [phone]

## 2024-05-21 NOTE — ED Triage Notes (Signed)
 Pt comes in for vomiting blood then started having nosebleed. Patient states she is dizzy and lightheaded when walking into ER. Patient complains of bilateral chest pain.

## 2024-05-21 NOTE — Discharge Instructions (Signed)
 Evaluation today was overall reassuring.  Please follow your PCP.  If your symptoms worsen in any way please return to the ED for further evaluation.

## 2024-05-21 NOTE — ED Provider Notes (Signed)
 Accepted handoff at shift change from [facility], PA-C. Please see prior provider note for more detail.   Briefly: Patient is 28 y.o. presenting for evaluation of vomiting and nosebleed  DDX: concern for gastritis, gastroenteritis, AMI, nephrolithiasis, pancreatitis, peritonitis, adrenal insufficiency,lead poisoning, iron toxicity, intestinal ischemia, constipation, UTI,SBO/LBO, splenic rupture, biliary disease, IBD, IBS, PUD, or hepatitis. ACS, AAS, PE, Mallory-Weiss, Boerhaave's, Pneumonia, acute bronchitis, asthma or COPD exacerbation, anxiety, MSK pain or traumatic injury to the chest, acid reflux versus other   Plan: CT abdomen pelvis, chest x-ray and labs are pending.   Physical Exam  BP (!) 139/95 (BP Location: Left Wrist)   Pulse (!) 116   Temp 98.6 F (37 C) (Oral)   Resp 15   Ht 5' 4.5 (1.638 m)   Wt (!) 189.6 kg   SpO2 100%   BMI 70.64 kg/m   Physical Exam  Procedures  Procedures  ED Course / MDM   Clinical Course as of 05/21/24 2204  Sun May 21, 2024  1920 Came in for hematemesis and epistaxis. CT for upper abdominal pain.  Likely dc. [JR]    Clinical Course User Index [JR] Lang Norleen POUR, PA-C   Medical Decision Making Amount and/or Complexity of Data Reviewed Labs: ordered. Radiology: ordered.  Risk Prescription drug management.   CT and x-ray without acute findings.  EKG revealed sinus tachycardia.  Workup overall reassuring.  Troponin was 2.  Initially tachycardic and mildly hypertensive but normalized throughout encounter.  Advised her to follow-up with her PCP.  Discussed return precautions.  Discharged.        Lang Norleen POUR, PA-C 05/21/24 2215    Patt Alm Macho, MD 05/21/24 [phone]

## 2024-05-23 ENCOUNTER — Telehealth: Payer: Self-pay | Admitting: Neurology

## 2024-05-23 NOTE — Telephone Encounter (Signed)
 Spoke w/Pt regarding headaches. Pt stated she was having issues with migraines and went to her PCP. Was given a script for Zavzpret and also a sample. She used sample and said it helped but she was also unable to get it filled as her pharmacy was out of the medication. 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 She stated PCP instructed her to call [facility]. Asked Pt if she is taking topiramate  to which she answered no. Discussed notes from her visit on 02/21/24 in which she is to be taking topiramate  25 mg BID and if headaches remain poorly controlled she is to increase to 50 mg BID. Pt stated she has not been taking and is not sure she has refills. According to chart Pt had a new script in Dec 2024 and it looks as though she did not get it refilled in which she had 3 available so she should be able to get it refilled by her pharmacy but to let us  know if they will not refill. Pt voiced understanding. Encouraged Pt to discuss new medication of Zavzpret with PCP or pharmacist regarding if she can repeat dosing if it does not help headache. Pt voiced understanding and stated she will reach out to the pharmacy. Also encouraged Pt to make PCP aware if medication is not helping. Pt voiced understanding.

## 2024-05-23 NOTE — Telephone Encounter (Signed)
 Pt called to request call back the patient states that her migraines have came back and that she having signs of dizziness PLAN: Pt  PCP gave her mediation the patient could not think of th name that  PCP gave her but that not working either . Pt states it was a ansel Spray . PT would like to speak to MD or Nurse to discuss changing medication.

## 2024-05-25 ENCOUNTER — Other Ambulatory Visit: Payer: Self-pay | Admitting: Nurse Practitioner

## 2024-05-25 DIAGNOSIS — R109 Unspecified abdominal pain: Secondary | ICD-10-CM

## 2024-05-28 ENCOUNTER — Encounter (HOSPITAL_COMMUNITY): Payer: Self-pay

## 2024-05-28 ENCOUNTER — Emergency Department (HOSPITAL_COMMUNITY)

## 2024-05-28 ENCOUNTER — Emergency Department (HOSPITAL_COMMUNITY)
Admission: EM | Admit: 2024-05-28 | Discharge: 2024-05-29 | Disposition: A | Discharge status: Home / Self Care | Attending: Emergency Medicine | Admitting: Emergency Medicine

## 2024-05-28 ENCOUNTER — Other Ambulatory Visit: Payer: Self-pay

## 2024-05-28 DIAGNOSIS — E119 Type 2 diabetes mellitus without complications: Secondary | ICD-10-CM | POA: Diagnosis not present

## 2024-05-28 DIAGNOSIS — R0602 Shortness of breath: Secondary | ICD-10-CM | POA: Insufficient documentation

## 2024-05-28 DIAGNOSIS — R600 Localized edema: Secondary | ICD-10-CM | POA: Diagnosis not present

## 2024-05-28 DIAGNOSIS — R1012 Left upper quadrant pain: Secondary | ICD-10-CM | POA: Insufficient documentation

## 2024-05-28 DIAGNOSIS — R112 Nausea with vomiting, unspecified: Secondary | ICD-10-CM | POA: Insufficient documentation

## 2024-05-28 DIAGNOSIS — R000 Tachycardia, unspecified: Secondary | ICD-10-CM | POA: Insufficient documentation

## 2024-05-28 DIAGNOSIS — R0609 Other forms of dyspnea: Secondary | ICD-10-CM

## 2024-05-28 LAB — COMPREHENSIVE METABOLIC PANEL WITH GFR
ALT: 18 U/L (ref 0–44)
AST: 20 U/L (ref 15–41)
Albumin: 3.7 g/dL (ref 3.5–5.0)
Alkaline Phosphatase: 100 U/L (ref 38–126)
Anion gap: 11 (ref 5–15)
BUN: 8 mg/dL (ref 6–20)
CO2: 21 mmol/L — ABNORMAL LOW (ref 22–32)
Calcium: 9.4 mg/dL (ref 8.9–10.3)
Chloride: 104 mmol/L (ref 98–111)
Creatinine, Ser: 0.71 mg/dL (ref 0.44–1.00)
GFR, Estimated: >60 mL/min (ref >60)
Glucose, Bld: 201 mg/dL — ABNORMAL HIGH (ref 70–99)
Potassium: 3.7 mmol/L (ref 3.5–5.1)
Sodium: 136 mmol/L (ref 135–145)
Total Bilirubin: 0.8 mg/dL (ref 0.0–1.2)
Total Protein: 7.7 g/dL (ref 6.5–8.1)

## 2024-05-28 LAB — CBC
HCT: 47 % — ABNORMAL HIGH (ref 36.0–46.0)
Hemoglobin: 14.1 g/dL (ref 12.0–15.0)
MCH: 25.5 pg — ABNORMAL LOW (ref 26.0–34.0)
MCHC: 30 g/dL (ref 30.0–36.0)
MCV: 85.1 fL (ref 80.0–100.0)
Platelets: 307 K/uL (ref 150–400)
RBC: 5.52 MIL/uL — ABNORMAL HIGH (ref 3.87–5.11)
RDW: 13.9 % (ref 11.5–15.5)
WBC: 9.6 K/uL (ref 4.0–10.5)
nRBC: 0 % (ref 0.0–0.2)

## 2024-05-28 LAB — D-DIMER, QUANTITATIVE: D-Dimer, Quant: 0.32 ug{FEU}/mL (ref 0.00–0.50)

## 2024-05-28 LAB — LIPASE, BLOOD: Lipase: 31 U/L (ref 11–51)

## 2024-05-28 LAB — TROPONIN I (HIGH SENSITIVITY): Troponin I (High Sensitivity): <2 ng/L (ref <18)

## 2024-05-28 LAB — BRAIN NATRIURETIC PEPTIDE: B Natriuretic Peptide: 19.8 pg/mL (ref 0.0–100.0)

## 2024-05-28 LAB — HCG, SERUM, QUALITATIVE: Preg, Serum: NEGATIVE

## 2024-05-28 MED ORDER — ONDANSETRON HCL 4 MG/2ML IJ SOLN
4.0000 mg | Freq: Once | INTRAMUSCULAR | Status: AC
  Administered 2024-05-28: 4 mg via INTRAVENOUS
  Filled 2024-05-28: qty 2

## 2024-05-28 MED ORDER — LIDOCAINE 5 % EX PTCH
1.0000 | MEDICATED_PATCH | CUTANEOUS | Status: DC
  Administered 2024-05-29: 1 via TRANSDERMAL
  Filled 2024-05-28: qty 1

## 2024-05-28 MED ORDER — METHOCARBAMOL 500 MG PO TABS: 500.0000 mg | ORAL_TABLET | Freq: Two times a day (BID) | ORAL | 0 refills | Status: DC | PRN

## 2024-05-28 MED ORDER — IOHEXOL 350 MG/ML SOLN
75.0000 mL | Freq: Once | INTRAVENOUS | Status: AC | PRN
  Administered 2024-05-28: 75 mL via INTRAVENOUS

## 2024-05-28 MED ORDER — LORAZEPAM 2 MG/ML IJ SOLN
1.0000 mg | Freq: Once | INTRAMUSCULAR | Status: AC
  Administered 2024-05-28: 1 mg via INTRAVENOUS
  Filled 2024-05-28: qty 1

## 2024-05-28 MED ORDER — IPRATROPIUM BROMIDE 0.02 % IN SOLN
0.5000 mg | Freq: Once | RESPIRATORY_TRACT | Status: AC
  Administered 2024-05-28: 0.5 mg via RESPIRATORY_TRACT
  Filled 2024-05-28: qty 2.5

## 2024-05-28 MED ORDER — ALBUTEROL SULFATE (2.5 MG/3ML) 0.083% IN NEBU
5.0000 mg | INHALATION_SOLUTION | Freq: Once | RESPIRATORY_TRACT | Status: AC
  Administered 2024-05-28: 5 mg via RESPIRATORY_TRACT
  Filled 2024-05-28: qty 6

## 2024-05-28 NOTE — ED Triage Notes (Signed)
 Pt reports:  SOB Worsening since last night Tired inhaler/neb treatment Little relief Difficulty breathing with ambulation Hx of fluid around the heart/lungs  Abdomen pain Left upper quadrant Throbbing/sharp/shooting pain Weakness Ongoing since yesterday Hot/Cold Sensation Started today Nausea Happened this morning Not currently Took omeprazole Relieved nausea

## 2024-05-28 NOTE — ED Provider Notes (Signed)
 Jade Lopez   CSN: [id number] Arrival date & time: 05/28/24  1821     Patient presents with: Shortness of Breath, Abdominal Pain, and Weakness   Jade Lopez is a 28 y.o. female with history of splenomegaly, pleural effusion, type 2 diabetes, lupus presents emerged from today for evaluation of left upper quadrant pain for the past few months but worsening the past few days.  Reports the pain is worse with breathing and does feel some shortness of breath.  Describes it as squeezing in nature but occasionally be sharp and throbbing.  Has never tried any medication for her symptoms.  Was recently seen at [facility] and is scheduled for CT examination tomorrow however patient reports that she wanted to come in today to find out what was going on.  She denies any chest pain, cough, runny nose, nasal congestion, diarrhea, constipation, dysuria, hematuria, melena, hematochezia, fever.  Reports some bilateral leg swelling however has been chronic for years and has been unchanged.  Reports that she felt some nausea with 1 episode of vomiting yesterday but has not had any since.  She reports that she has been seen multiple times from cardiology at different locations for her palpitations and tachycardia.  Shortness of Breath Associated symptoms: abdominal pain and vomiting   Associated symptoms: no chest pain, no cough and no fever   Abdominal Pain Associated symptoms: fatigue, nausea, shortness of breath and vomiting   Associated symptoms: no chest pain, no chills, no constipation, no cough, no diarrhea, no dysuria, no fever and no hematuria   Weakness Associated symptoms: abdominal pain, nausea, shortness of breath and vomiting   Associated symptoms: no chest pain, no cough, no diarrhea, no dysuria and no fever        Prior to Admission medications   Medication Sig Start Date End Date Taking? Authorizing Provider  Accu-Chek  Softclix Lancets lancets daily. 12/15/22   [provider]  albuterol  (PROVENTIL ) (2.5 MG/3ML) 0.083% nebulizer solution Take 3 mLs (2.5 mg total) by nebulization every 6 (six) hours as needed for wheezing or shortness of breath. 12/08/23   Loreda Myla SAUNDERS, NP  albuterol  (VENTOLIN  HFA) 108 (90 Base) MCG/ACT inhaler Inhale 1-2 puffs into the lungs every 6 (six) hours as needed for wheezing or shortness of breath. 05/10/23   Odell Celinda Balo, MD  BIOTIN PO Take 2 capsules by mouth daily. Hair, skin nails Patient not taking: Reported on 04/27/2024    [provider]  Blood Glucose Monitoring Suppl (MM EASY TOUCH GLUCOSE METER) w/Device KIT Please check your blood sugar daily, in the morning. 12/08/22   Franchot Novel, MD  Blood Glucose Monitoring Suppl DEVI 1 each by Does not apply route 3 (three) times daily. May dispense any manufacturer covered by patient's insurance. 05/10/23   Odell Celinda Balo, MD  budesonide -formoterol  (SYMBICORT ) 80-4.5 MCG/ACT inhaler Inhale 2 puffs into the lungs 2 (two) times daily as needed. 05/19/23   Amoako, Prince, MD  busPIRone  (BUSPAR ) 10 MG tablet Take 3 tablets (30 mg total) by mouth daily. Patient not taking: Reported on 04/27/2024 11/25/22   Franchot Novel, MD  cetirizine (ZYRTEC) 10 MG tablet Take 10 mg by mouth daily. 04/06/24   [provider]  cyclobenzaprine  (FLEXERIL ) 10 MG tablet Take 1 tablet (10 mg total) by mouth at bedtime. Patient not taking: Reported on 04/27/2024 01/13/24   Jeannetta Lonni ORN, MD  doxycycline  (VIBRAMYCIN ) 100 MG capsule Take 1 capsule (100 mg total)  by mouth 2 (two) times daily. 04/27/24   Rice, Lonni ORN, MD  Glucose Blood (BLOOD GLUCOSE TEST STRIPS) STRP 1 each by Does not apply route 3 (three) times daily. Use as directed to check blood sugar. May dispense any manufacturer covered by patient's insurance and fits patient's device. 05/10/23   Odell Celinda Balo, MD  HYDROcodone -acetaminophen  (NORCO/VICODIN)  5-325 MG tablet Take 1 tablet by mouth every 6 (six) hours as needed. Patient not taking: Reported on 04/27/2024 12/02/23   Logan Ubaldo KATHEE DEVONNA  Lancet Device MISC 1 each by Does not apply route 3 (three) times daily. May dispense any manufacturer covered by patient's insurance. 05/10/23   Odell Celinda Balo, MD  Lancets MISC 1 each by Does not apply route 3 (three) times daily. Use as directed to check blood sugar. May dispense any manufacturer covered by patient's insurance and fits patient's device. 05/10/23   Odell Celinda Balo, MD  lidocaine  (LIDODERM ) 5 % Place 1 patch onto the skin daily. Remove & Discard patch within 12 hours or as directed by MD Patient not taking: Reported on 04/27/2024 01/25/24   Kommor, Lum, MD  medroxyPROGESTERone  (DEPO-PROVERA ) 150 MG/ML injection Inject 150 mg into the muscle every 3 (three) months.    [provider]  MOUNJARO  10 MG/0.5ML Pen SMARTSIG:10 Milligram(s) SUB-Q Once a Week 04/20/24   [provider]  MOUNJARO  7.5 MG/0.5ML Pen Inject into the skin. Patient not taking: Reported on 04/27/2024 09/30/23   [provider]  Multiple Vitamins-Minerals (ZINC PO) Take 1 tablet by mouth daily. Patient not taking: Reported on 04/27/2024    [provider]  naloxone  (NARCAN ) nasal spray 4 mg/0.1 mL SMARTSIG:Both Nares 03/31/24   [provider]  naproxen  (NAPROSYN ) 500 MG tablet Take 1 tablet (500 mg total) by mouth 2 (two) times daily with a meal. Patient not taking: Reported on 04/27/2024 10/31/23   Christopher Savannah, PA-C  norethindrone  (AYGESTIN ) 5 MG tablet Take 10 mg by mouth at bedtime. Patient not taking: Reported on 04/27/2024 12/11/22   [provider]  nystatin (MYCOSTATIN) 100000 UNIT/ML suspension Take 5 mLs by mouth 4 (four) times daily. 04/06/24   [provider]  ondansetron  (ZOFRAN -ODT) 8 MG disintegrating tablet Take 1 tablet (8 mg total) by mouth every 8 (eight) hours as needed for nausea or  vomiting. 05/17/23   Mayer, Jodi R, NP  oxyCODONE  (ROXICODONE ) 5 MG immediate release tablet Take 1 tablet (5 mg total) by mouth every 4 (four) hours as needed for severe pain (pain score 7-10). Patient not taking: Reported on 04/27/2024 01/24/24   Kommor, Lum, MD  oxyCODONE -acetaminophen  (PERCOCET) 10-325 MG tablet Take 1 tablet by mouth. 03/31/24   [provider]  pregabalin  (LYRICA ) 50 MG capsule Take 1 capsule (50 mg total) by mouth 3 (three) times daily. 04/27/24   Rice, Lonni ORN, MD  promethazine -dextromethorphan (PROMETHAZINE -DM) 6.25-15 MG/5ML syrup Take 5 mLs by mouth 4 (four) times daily as needed for cough. Patient not taking: Reported on 04/27/2024 12/08/23   Mayer, Jodi R, NP  SUMAtriptan  (IMITREX ) 100 MG tablet Take 1 tablet (100 mg total) by mouth once as needed for up to 1 dose for migraine (Take 1 tablet at onset. may repeat an additional tablet in 2 hours if needed (Do not exceed more than 2 tablet in 24 hrs)). 02/28/24   Dohmeier, Dedra, MD  tirzepatide  (MOUNJARO ) 7.5 MG/0.5ML Pen Inject 7.5 mg into the skin once a week. Patient not taking: Reported on 04/27/2024 01/31/24   [provider]  tiZANidine (ZANAFLEX) 4 MG tablet Take 1 tablet by mouth. Patient not taking: Reported on 04/27/2024 03/18/24   [provider]  topiramate  (TOPAMAX ) 25 MG tablet Take 1 tablet (25 mg total) by mouth 2 (two) times daily. Patient not taking: Reported on 04/27/2024 11/08/23   Dohmeier, Dedra, MD  traZODone  (DESYREL ) 300 MG tablet Take 1 tablet (300 mg total) by mouth at bedtime. Patient not taking: Reported on 04/27/2024 09/22/23   Leonce Katz, DO  triamcinolone ointment (KENALOG) 0.1 % Apply 1 Application topically daily at 12 noon.    [provider]  Turmeric, Curcuma [place], (TURMERIC ROOT) POWD Take 15 mLs by mouth daily. Put in food    [provider]    Allergies: Clindamycin/lincomycin, Gabapentin, Other, Pregabalin , Cephalexin , Iodinated  contrast media, Meloxicam, and Clindamycin    Review of Systems  Constitutional:  Positive for fatigue. Negative for chills and fever.  HENT:  Negative for congestion and rhinorrhea.   Respiratory:  Positive for shortness of breath. Negative for cough.   Cardiovascular:  Positive for leg swelling. Negative for chest pain and palpitations.  Gastrointestinal:  Positive for abdominal pain, nausea and vomiting. Negative for blood in stool, constipation and diarrhea.  Genitourinary:  Negative for dysuria and hematuria.    Updated Vital Signs BP 110/82   Pulse (!) 123   Temp 98.5 F (36.9 C)   Resp (!) 21   Ht 5' 4.5 (1.638 m)   Wt (!) 189.6 kg   SpO2 99%   BMI 70.64 kg/m   Physical Exam Constitutional:      General: She is not in acute distress.    Appearance: She is obese. She is not toxic-appearing.     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  Cardiovascular:     Rate and Rhythm: Tachycardia present.     Pulses:          Radial pulses are 2+ on the right side and 2+ on the left side.       Dorsalis pedis pulses are 2+ on the right side and 2+ on the left side.       Posterior tibial pulses are 2+ on the right side and 2+ on the left side.  Pulmonary:     Effort: Pulmonary effort is normal. No respiratory distress.     Breath sounds: Normal breath sounds. No decreased breath sounds.     Comments: Speaking in full sentences satting well on room air without increased work of breathing Abdominal:     Palpations: Abdomen is soft.     Tenderness: There is abdominal tenderness.     Comments: Tenderness underneath the left breast overlying rib cage/left upper quadrant of abdomen.  Soft.  Musculoskeletal:     Right lower leg: No tenderness. Edema present.     Left lower leg: No tenderness. Edema present.     Comments: Chronic edema to lower legs.  Bilateral.  Skin:    General: Skin is warm and dry.   Neurological:     Mental Status: She is alert.     (all labs ordered are listed, but only abnormal results are displayed) Labs Reviewed  COMPREHENSIVE METABOLIC PANEL WITH GFR - Abnormal; Notable for the following components:      Result Value   CO2 21 (*)    Glucose, Bld 201 (*)    All other components  within normal limits  CBC - Abnormal; Notable for the following components:   RBC 5.52 (*)    HCT 47.0 (*)    MCH 25.5 (*)    All other components within normal limits  HCG, SERUM, QUALITATIVE  LIPASE, BLOOD  D-DIMER, QUANTITATIVE  BRAIN NATRIURETIC PEPTIDE  URINALYSIS, ROUTINE W REFLEX MICROSCOPIC  TROPONIN I (HIGH SENSITIVITY)  TROPONIN I (HIGH SENSITIVITY)    EKG: EKG Interpretation Date/Time:  Sunday May 28 2024 18:43:00 EDT Ventricular Rate:  121 PR Interval:  129 QRS Duration:  84 QT Interval:  308 QTC Calculation: 437 R Axis:   27  Text Interpretation: Sinus tachycardia Confirmed by Bernard Drivers ([id number]) on 05/28/2024 8:38:17 PM  Radiology: CT Angio Chest PE W and/or Wo Contrast Result Date: 05/28/2024 CLINICAL DATA:  Shortness of breath. Worsening dyspnea on exertion. Pulmonary embolism (PE) suspected, high prob LUQ pain, h/o spleenomegaly EXAM: CT ANGIOGRAPHY CHEST CT ABDOMEN AND PELVIS WITH CONTRAST TECHNIQUE: Multidetector CT imaging of the chest was performed using the standard protocol during bolus administration of intravenous contrast. Multiplanar CT image reconstructions and MIPs were obtained to evaluate the vascular anatomy. Multidetector CT imaging of the abdomen and pelvis was performed using the standard protocol during bolus administration of intravenous contrast. RADIATION DOSE REDUCTION: This exam was performed according to the departmental dose-optimization program which includes automated exposure control, adjustment of the mA and/or kV according to patient size and/or use of iterative reconstruction technique. CONTRAST:  75mL OMNIPAQUE  IOHEXOL  350  MG/ML SOLN COMPARISON:  CT abdomen pelvis 05/21/2024, CT abdomen pelvis 05/07/2023 FINDINGS: CTA CHEST FINDINGS Cardiovascular: Preferential opacification of the thoracic aorta. No evidence of thoracic aortic aneurysm or dissection. Normal heart size. No significant pericardial effusion. No atherosclerotic plaque of the thoracic aorta. No coronary artery calcifications. No central pulmonary embolus. Limited evaluation more distally due to timing of contrast. Mediastinum/Nodes: No enlarged mediastinal, hilar, or axillary lymph nodes. Thyroid  gland, trachea, and esophagus demonstrate no significant findings. Lungs/Pleura: No focal consolidation. No pulmonary nodule. No pulmonary mass. No pleural effusion. No pneumothorax. Musculoskeletal: No chest wall abnormality. No suspicious lytic or blastic osseous lesions. No acute displaced fracture. Multilevel degenerative changes of the spine. Review of the MIP images confirms the above findings. CT ABDOMEN and PELVIS FINDINGS Hepatobiliary: Vague hypodensity of the left hepatic lobe along the false form ligament likely focal fatty infiltration (2:31). Hypodensity of the liver along the gallbladder fossa surgical bed likely postsurgical changes (8:166). Status post cholecystectomy. No biliary dilatation. Pancreas: No focal lesion. Normal pancreatic contour. No surrounding inflammatory changes. No main pancreatic ductal dilatation. Spleen: Normal in size without focal abnormality. Adrenals/Urinary Tract: No adrenal nodule bilaterally. Bilateral kidneys enhance symmetrically. No hydronephrosis. No hydroureter. The urinary bladder is unremarkable. Stomach/Bowel: Stomach is within normal limits. No evidence of bowel wall thickening or dilatation. Appendix appears normal. Vascular/Lymphatic: No abdominal aorta or iliac aneurysm. No abdominal, pelvic, or inguinal lymphadenopathy. Reproductive: Uterus and bilateral adnexa are unremarkable. Other: No intraperitoneal free fluid. No  intraperitoneal free gas. No organized fluid collection. Musculoskeletal: No abdominal wall hernia or abnormality. No suspicious lytic or blastic osseous lesions. No acute displaced fracture. Review of the MIP images confirms the above findings. IMPRESSION: 1. No central pulmonary embolus. Limited evaluation more distally due to timing of contrast. 2. No acute intrathoracic, intra-abdominal, intrapelvic abnormality. Electronically Signed   By: Morgane  Naveau M.D.   On: 05/28/2024 22:18   CT ABDOMEN PELVIS W CONTRAST Result Date: 05/28/2024 CLINICAL DATA:  Shortness of breath. Worsening dyspnea on  exertion. Pulmonary embolism (PE) suspected, high prob LUQ pain, h/o spleenomegaly EXAM: CT ANGIOGRAPHY CHEST CT ABDOMEN AND PELVIS WITH CONTRAST TECHNIQUE: Multidetector CT imaging of the chest was performed using the standard protocol during bolus administration of intravenous contrast. Multiplanar CT image reconstructions and MIPs were obtained to evaluate the vascular anatomy. Multidetector CT imaging of the abdomen and pelvis was performed using the standard protocol during bolus administration of intravenous contrast. RADIATION DOSE REDUCTION: This exam was performed according to the departmental dose-optimization program which includes automated exposure control, adjustment of the mA and/or kV according to patient size and/or use of iterative reconstruction technique. CONTRAST:  75mL OMNIPAQUE  IOHEXOL  350 MG/ML SOLN COMPARISON:  CT abdomen pelvis 05/21/2024, CT abdomen pelvis 05/07/2023 FINDINGS: CTA CHEST FINDINGS Cardiovascular: Preferential opacification of the thoracic aorta. No evidence of thoracic aortic aneurysm or dissection. Normal heart size. No significant pericardial effusion. No atherosclerotic plaque of the thoracic aorta. No coronary artery calcifications. No central pulmonary embolus. Limited evaluation more distally due to timing of contrast. Mediastinum/Nodes: No enlarged mediastinal, hilar, or  axillary lymph nodes. Thyroid  gland, trachea, and esophagus demonstrate no significant findings. Lungs/Pleura: No focal consolidation. No pulmonary nodule. No pulmonary mass. No pleural effusion. No pneumothorax. Musculoskeletal: No chest wall abnormality. No suspicious lytic or blastic osseous lesions. No acute displaced fracture. Multilevel degenerative changes of the spine. Review of the MIP images confirms the above findings. CT ABDOMEN and PELVIS FINDINGS Hepatobiliary: Vague hypodensity of the left hepatic lobe along the false form ligament likely focal fatty infiltration (2:31). Hypodensity of the liver along the gallbladder fossa surgical bed likely postsurgical changes (8:166). Status post cholecystectomy. No biliary dilatation. Pancreas: No focal lesion. Normal pancreatic contour. No surrounding inflammatory changes. No main pancreatic ductal dilatation. Spleen: Normal in size without focal abnormality. Adrenals/Urinary Tract: No adrenal nodule bilaterally. Bilateral kidneys enhance symmetrically. No hydronephrosis. No hydroureter. The urinary bladder is unremarkable. Stomach/Bowel: Stomach is within normal limits. No evidence of bowel wall thickening or dilatation. Appendix appears normal. Vascular/Lymphatic: No abdominal aorta or iliac aneurysm. No abdominal, pelvic, or inguinal lymphadenopathy. Reproductive: Uterus and bilateral adnexa are unremarkable. Other: No intraperitoneal free fluid. No intraperitoneal free gas. No organized fluid collection. Musculoskeletal: No abdominal wall hernia or abnormality. No suspicious lytic or blastic osseous lesions. No acute displaced fracture. Review of the MIP images confirms the above findings. IMPRESSION: 1. No central pulmonary embolus. Limited evaluation more distally due to timing of contrast. 2. No acute intrathoracic, intra-abdominal, intrapelvic abnormality. Electronically Signed   By: Morgane  Naveau M.D.   On: 05/28/2024 22:18   DG Chest Port 1  View Result Date: 05/28/2024 CLINICAL DATA:  Shortness of breath.  Worsening dyspnea on exertion. EXAM: PORTABLE CHEST 1 VIEW COMPARISON:  Radiograph 05/21/2024 lung bases from abdominal CT 05/21/2024 FINDINGS: Low lung volumes. Normal heart size for technique.The cardiomediastinal contours are normal. The lungs are clear. Pulmonary vasculature is normal. No consolidation, pleural effusion, or pneumothorax. No acute osseous abnormalities are seen. IMPRESSION: Low lung volumes without acute chest finding. Electronically Signed   By: Andrea Gasman M.D.   On: 05/28/2024 19:36   Procedures   Medications Ordered in the ED  albuterol  (PROVENTIL ) (2.5 MG/3ML) 0.083% nebulizer solution 5 mg (5 mg Nebulization Given 05/28/24 2121)  ipratropium (ATROVENT ) nebulizer solution 0.5 mg (0.5 mg Nebulization Given 05/28/24 2121)  ondansetron  (ZOFRAN ) injection 4 mg (4 mg Intravenous Given 05/28/24 2125)  LORazepam  (ATIVAN ) injection 1 mg (1 mg Intravenous Given 05/28/24 2124)  iohexol  (OMNIPAQUE ) 350 MG/ML injection 75  mL (75 mLs Intravenous Contrast Given 05/28/24 2154)   Medical Decision Making Amount and/or Complexity of Data Reviewed Labs: ordered. Radiology: ordered.  Risk Prescription drug management.   28 y.o. female presents to the ER for evaluation of DOE, LUQ pain. Differential diagnosis includes but is not limited to CHF, pericardial effusion/tamponade, arrhythmias, ACS, COPD, asthma, bronchitis, pneumonia, pneumothorax, PE, anemia, AAA, mesenteric ischemia, appendicitis, diverticulitis, DKA, gastroenteritis, nephrolithiasis, pancreatitis, constipation, UTI, bowel obstruction, biliary disease, IBD, PUD, hepatitis. Vital signs blood pressure 110/82, heart rate in the 120s, satting well on room air without any increased work of breathing. Physical exam as noted above.   I independently reviewed and interpreted the patient's labs.  D-dimer within normal limits at 0.32.  hCG negative.  Lipase within  normal limits.  CMP shows bicarb at 21, Leukos at 201 otherwise no electrolyte or LFT abnormality.  CBC without leukocytosis or anemia.  Hematocrit mildly elevated at 47.  Troponin less than 2 with repeat at <2.  BNP within normal limits.  Chest x-ray shows low lung volumes without any acute findings.  I discussed this case with my attending.  He evaluated the patient at bedside.  Agrees with CT imaging.  If unremarkable, can be discharged home.  It appears that she has been tachycardic in multiple different visits.  Patient reports that she does have history of high heart rate.  CT Angio PE and AP shows 1. No central pulmonary embolus. Limited evaluation more distally due to timing of contrast. 2. No acute intrathoracic, intra-abdominal, intrapelvic abnormality. Per radiologist's interpretation.    She has been seen before by multiple different cardiologist per patient.  She has tried beta-blockers however dropped her blood pressure too low and she was having syncopal episodes.  Her lung sounds are clear to auscultation.  She is not having any cough symptoms.  CT of her chest does not show any large central PE or any pneumonia.  No pneumothorax.  Her lab work is within normal limits.  Normal BNP.  Normal troponin.  Doubt any ACS.  I have given her the information for [facility] cardiology and put an ambulatory referral.  For left upper quadrant pain, this has been going on for months and she was scheduled to get a CT at [facility] tomorrow.  Discussed that she does not need to complete that and can follow-up with [facility] to have them request the records from last.  The patient was ambulated and did not desat.   Lung sounds are clear. Symptoms may be heart rate related or from body habitus.  Discussed with attending, plan is still for discharge with specialty follow up. Will need outpatient follow-up but stable for discharge home.  We discussed the results of the labs/imaging. The plan is for  cardiologist. We discussed strict return precautions and red flag symptoms. The patient verbalized their understanding and agrees to the plan. The patient is stable and being discharged home in good condition.  I discussed this case with my attending physician who cosigned this Lopez including patient's presenting symptoms, physical exam, and planned diagnostics and interventions. Attending physician stated agreement with plan or made changes to plan which were implemented.   Attending physician assessed patient at bedside.  Portions of this report may have been transcribed using voice recognition software. Every effort was made to ensure accuracy; however, inadvertent computerized transcription errors may be present.    Final diagnoses:  DOE (dyspnea on exertion)  Tachycardia  LUQ pain    ED  Discharge Orders          Ordered    Ambulatory referral to Cardiology        05/28/24 2349    methocarbamol  (ROBAXIN ) 500 MG tablet  2 times daily PRN        05/28/24 2350               Bernis Ernst, PA-C 06/03/24 1023    Bernard Drivers, MD 06/12/24 [phone]

## 2024-05-28 NOTE — ED Notes (Addendum)
 Pt noted to have increased sob when conversating. Hx of PEs, completed course of blood thinners and lupus. Sob worsens on exertion.

## 2024-05-28 NOTE — Discharge Instructions (Addendum)
 You were seen in the emergency room today for evaluation of your symptoms.  Your CT imaging was unremarkable.  Please make sure you follow-up with your cardiologist.  I have also included information for a cardiologist into the discharge paperwork.  They should call you within the next few days schedule an appointment however if they do not, please make sure you call to schedule.  I will send you in some lidocaine  patches and muscle laxer's to take for your left upper quadrant pain.  If you have any concerns, new or worsening symptoms, please return to the nearest Emergency Department for reevaluation.  Contact a doctor if: Your condition does not get better as soon as expected. You have a hard time doing your normal activities, even after you rest. You have new symptoms. You cannot walk up stairs. You cannot exercise the way you normally do. Get help right away if: Your shortness of breath gets worse. You have trouble breathing when you are resting. You feel light-headed or you faint. You have a cough that is not helped by medicines. You cough up blood. You have pain with breathing. You have pain in your chest, arms, shoulders, or belly (abdomen). You have a fever. These symptoms may be an emergency. Get help right away. Call 911. Do not wait to see if the symptoms will go away. Do not drive yourself to the hospital.

## 2024-05-28 NOTE — ED Notes (Signed)
 Contacted lab again to add on bnp and first troponin

## 2024-05-28 NOTE — ED Notes (Signed)
 Lab contacted to add on troponin and bnp

## 2024-05-28 NOTE — ED Notes (Signed)
 Pt ambulated from room to bathroom. Initial pulse at 120s, increased to 163, sats at 100%. Increased work of breath. Hadto take multiple breaks to ambulate down the hall. Felt lightheaded at one point and had to stop and be assisted to a wheelchair. Lowest oxygen sats 96% on room air

## 2024-05-29 ENCOUNTER — Ambulatory Visit
Admission: RE | Admit: 2024-05-29 | Discharge: 2024-05-29 | Disposition: A | Discharge status: Home / Self Care | Source: Ambulatory Visit | Attending: Nurse Practitioner | Admitting: Nurse Practitioner

## 2024-05-29 DIAGNOSIS — R109 Unspecified abdominal pain: Secondary | ICD-10-CM

## 2024-05-29 LAB — URINALYSIS, ROUTINE W REFLEX MICROSCOPIC
Bilirubin Urine: NEGATIVE
Glucose, UA: NEGATIVE mg/dL
Hgb urine dipstick: NEGATIVE
Ketones, ur: NEGATIVE mg/dL
Leukocytes,Ua: NEGATIVE
Nitrite: NEGATIVE
Protein, ur: NEGATIVE mg/dL
Specific Gravity, Urine: 1.005 (ref 1.005–1.030)
pH: 6 (ref 5.0–8.0)

## 2024-05-29 LAB — TROPONIN I (HIGH SENSITIVITY): Troponin I (High Sensitivity): <2 ng/L (ref <18)

## 2024-06-01 ENCOUNTER — Ambulatory Visit
Admission: EM | Admit: 2024-06-01 | Discharge: 2024-06-01 | Disposition: A | Discharge status: Home / Self Care | Attending: Family Medicine | Admitting: Family Medicine

## 2024-06-01 ENCOUNTER — Ambulatory Visit (INDEPENDENT_AMBULATORY_CARE_PROVIDER_SITE_OTHER)

## 2024-06-01 DIAGNOSIS — B9789 Other viral agents as the cause of diseases classified elsewhere: Secondary | ICD-10-CM | POA: Diagnosis not present

## 2024-06-01 DIAGNOSIS — J988 Other specified respiratory disorders: Secondary | ICD-10-CM | POA: Diagnosis not present

## 2024-06-01 DIAGNOSIS — R058 Other specified cough: Secondary | ICD-10-CM | POA: Diagnosis not present

## 2024-06-01 DIAGNOSIS — J4541 Moderate persistent asthma with (acute) exacerbation: Secondary | ICD-10-CM | POA: Diagnosis not present

## 2024-06-01 LAB — POCT FASTING CBG KUC MANUAL ENTRY: POCT Glucose (KUC): 222 mg/dL — AB (ref 70–99)

## 2024-06-01 MED ORDER — GLIPIZIDE 2.5 MG PO TABS: 2.5000 mg | ORAL_TABLET | Freq: Two times a day (BID) | ORAL | 0 refills | Status: DC

## 2024-06-01 NOTE — ED Provider Notes (Signed)
 [name]  Note:  This document was prepared using Conservation officer, historic buildings and may include unintentional dictation errors.  MRN: [id number] DOB: 1995-11-16  Subjective:   Jade Lopez is a 28 y.o. female presenting for recheck on persistent malaise, shortness of breath, wheezing, pleuritic pain more so on the left side.  Patient was seen through the emergency room 4 days ago on 05/28/2024 and had extensive testing including negative chest CT scan to rule out pulmonary embolism, negative AP view chest x-ray, negative CT abdomen pelvis.  She also had extensive labs, EKG demonstrated tachycardia without an arrhythmia or ACS, other acute cardiac process.  Was advised to follow-up with her cardiologist.  Subsequently, patient was seen at [facility] yesterday and was given a steroid shot. Feels like she is worse after the steroid injection. Was given a prescription for oral steroids but was instructed to start today. She has not.  Has been using her albuterol  inhaler consistently.  Also takes Symbicort .  She did check her blood sugar this morning and was 240 fasting.  She is currently taking Mounjaro  injections.  Has had a difficult time in the past with metformin  and does not want to take this medication.  Has not been prescribed insulin .  No current facility-administered medications for this encounter.  Current Outpatient Medications:    Accu-Chek Softclix Lancets lancets, daily., Disp: , Rfl:    albuterol  (PROVENTIL ) (2.5 MG/3ML) 0.083% nebulizer solution, Take 3 mLs (2.5 mg total) by nebulization every 6 (six) hours as needed for wheezing or shortness of breath., Disp: 75 mL, Rfl: 0   albuterol  (VENTOLIN  HFA) 108 (90 Base) MCG/ACT inhaler, Inhale 1-2 puffs into the lungs every 6 (six) hours as needed for wheezing or shortness of breath., Disp: 1 each, Rfl: 1   BIOTIN PO, Take 2 capsules by mouth daily. Hair, skin nails (Patient not taking: No sig reported),  Disp: , Rfl:    Blood Glucose Monitoring Suppl (MM EASY TOUCH GLUCOSE METER) w/Device KIT, Please check your blood sugar daily, in the morning., Disp: 1 kit, Rfl: 0   Blood Glucose Monitoring Suppl DEVI, 1 each by Does not apply route 3 (three) times daily. May dispense any manufacturer covered by patient's insurance., Disp: 1 each, Rfl: 0   budesonide -formoterol  (SYMBICORT ) 80-4.5 MCG/ACT inhaler, Inhale 2 puffs into the lungs 2 (two) times daily as needed., Disp: 1 each, Rfl: 12   busPIRone  (BUSPAR ) 10 MG tablet, Take 3 tablets (30 mg total) by mouth daily. (Patient not taking: No sig reported), Disp: 30 tablet, Rfl: 5   cetirizine (ZYRTEC) 10 MG tablet, Take 10 mg by mouth daily., Disp: , Rfl:    cyclobenzaprine  (FLEXERIL ) 10 MG tablet, Take 1 tablet (10 mg total) by mouth at bedtime. (Patient not taking: No sig reported), Disp: 34 tablet, Rfl: 0   doxycycline  (VIBRAMYCIN ) 100 MG capsule, Take 1 capsule (100 mg total) by mouth 2 (two) times daily., Disp: 60 capsule, Rfl: 2   Glucose Blood (BLOOD GLUCOSE TEST STRIPS) STRP, 1 each by Does not apply route 3 (three) times daily. Use as directed to check blood sugar. May dispense any manufacturer covered by patient's insurance and fits patient's device., Disp: 100 strip, Rfl: 0   HYDROcodone -acetaminophen  (NORCO/VICODIN) 5-325 MG tablet, Take 1 tablet by mouth every 6 (six) hours as needed. (Patient not taking: No sig reported), Disp: 8 tablet, Rfl: 0   Lancet Device MISC, 1 each by Does not apply route 3 (three) times daily. May  dispense any manufacturer covered by patient's insurance., Disp: 1 each, Rfl: 0   Lancets MISC, 1 each by Does not apply route 3 (three) times daily. Use as directed to check blood sugar. May dispense any manufacturer covered by patient's insurance and fits patient's device., Disp: 100 each, Rfl: 0   lidocaine  (LIDODERM ) 5 %, Place 1 patch onto the skin daily. Remove & Discard patch within 12 hours or as directed by MD (Patient  not taking: No sig reported), Disp: 30 patch, Rfl: 0   medroxyPROGESTERone  (DEPO-PROVERA ) 150 MG/ML injection, Inject 150 mg into the muscle every 3 (three) months., Disp: , Rfl:    methocarbamol  (ROBAXIN ) 500 MG tablet, Take 1 tablet (500 mg total) by mouth 2 (two) times daily as needed., Disp: 10 tablet, Rfl: 0   MOUNJARO  10 MG/0.5ML Pen, SMARTSIG:10 Milligram(s) SUB-Q Once a Week, Disp: , Rfl:    MOUNJARO  7.5 MG/0.5ML Pen, Inject into the skin. (Patient not taking: No sig reported), Disp: , Rfl:    Multiple Vitamins-Minerals (ZINC PO), Take 1 tablet by mouth daily. (Patient not taking: No sig reported), Disp: , Rfl:    naloxone  (NARCAN ) nasal spray 4 mg/0.1 mL, SMARTSIG:Both Nares, Disp: , Rfl:    naproxen  (NAPROSYN ) 500 MG tablet, Take 1 tablet (500 mg total) by mouth 2 (two) times daily with a meal. (Patient not taking: No sig reported), Disp: 30 tablet, Rfl: 0   norethindrone  (AYGESTIN ) 5 MG tablet, Take 10 mg by mouth at bedtime. (Patient not taking: No sig reported), Disp: , Rfl:    nystatin (MYCOSTATIN) 100000 UNIT/ML suspension, Take 5 mLs by mouth 4 (four) times daily., Disp: , Rfl:    ondansetron  (ZOFRAN -ODT) 8 MG disintegrating tablet, Take 1 tablet (8 mg total) by mouth every 8 (eight) hours as needed for nausea or vomiting., Disp: 20 tablet, Rfl: 0   oxyCODONE  (ROXICODONE ) 5 MG immediate release tablet, Take 1 tablet (5 mg total) by mouth every 4 (four) hours as needed for severe pain (pain score 7-10). (Patient not taking: No sig reported), Disp: 10 tablet, Rfl: 0   oxyCODONE -acetaminophen  (PERCOCET) 10-325 MG tablet, Take 1 tablet by mouth., Disp: , Rfl:    pregabalin  (LYRICA ) 50 MG capsule, Take 1 capsule (50 mg total) by mouth 3 (three) times daily., Disp: 90 capsule, Rfl: 2   promethazine -dextromethorphan (PROMETHAZINE -DM) 6.25-15 MG/5ML syrup, Take 5 mLs by mouth 4 (four) times daily as needed for cough. (Patient not taking: No sig reported), Disp: 118 mL, Rfl: 0   SUMAtriptan   (IMITREX ) 100 MG tablet, Take 1 tablet (100 mg total) by mouth once as needed for up to 1 dose for migraine (Take 1 tablet at onset. may repeat an additional tablet in 2 hours if needed (Do not exceed more than 2 tablet in 24 hrs))., Disp: 10 tablet, Rfl: 2   tirzepatide  (MOUNJARO ) 7.5 MG/0.5ML Pen, Inject 7.5 mg into the skin once a week. (Patient not taking: No sig reported), Disp: , Rfl:    topiramate  (TOPAMAX ) 25 MG tablet, Take 1 tablet (25 mg total) by mouth 2 (two) times daily. (Patient not taking: No sig reported), Disp: 60 tablet, Rfl: 3   traZODone  (DESYREL ) 300 MG tablet, Take 1 tablet (300 mg total) by mouth at bedtime. (Patient not taking: No sig reported), Disp: 30 tablet, Rfl: 0   triamcinolone ointment (KENALOG) 0.1 %, Apply 1 Application topically daily at 12 noon., Disp: , Rfl:    Turmeric, Curcuma [place]a, (TURMERIC ROOT) POWD, Take 15 mLs by mouth daily.  Put in food, Disp: , Rfl:    Allergies  Allergen Reactions   Clindamycin/Lincomycin Other (See Comments), Hives, Palpitations, Rash and Swelling    Other Reaction(s): Racing heart beat   Gabapentin Hives, Nausea And Vomiting, Palpitations, Rash and Shortness Of Breath    Other Reaction(s): Unknown   Other Anaphylaxis    Allergic to Rynatan cough syrup.   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   T2DM (type 2 diabetes mellitus) (HCC)    TOA (tubo-ovarian abscess)      Past  Surgical History:  Procedure Laterality Date   BREAST LUMPECTOMY Right 2024   CHOLECYSTECTOMY     DILATATION AND CURETTAGE/HYSTEROSCOPY WITH MINERVA     3 total   INCISION AND DRAINAGE ABSCESS  2021   buttocks   IR LUMBAR PUNCTURE  11/15/2023   RADIOLOGY WITH ANESTHESIA N/A 11/15/2023   Procedure: Lumbar Puncture;  Surgeon: Karalee Wilkie POUR, MD;  Location: [facility] OR;  Service: Radiology;  Laterality: N/A;   TONSILLECTOMY AND ADENOIDECTOMY      Family History  Problem Relation Age of Onset   Diabetes Mother    Hypertension Mother    Migraines Mother    Diabetes Father    Hypertension Father    Sleep apnea Father     Social History   Tobacco Use   Smoking status: Never    Passive exposure: Never   Smokeless tobacco: Never  Vaping Use   Vaping status: Never Used  Substance Use Topics   Alcohol use: Yes    Comment: occasional   Drug use: No    ROS   Objective:   Vitals: BP 131/88 (BP Location: Left Wrist)   Pulse 96   Temp 99.7 F (37.6 C) (Oral)   Resp (!) 22   SpO2 98%   Physical Exam Constitutional:      General: She is not in acute distress.    Appearance: Normal appearance. She is well-developed. She is obese. She is not ill-appearing, toxic-appearing or diaphoretic.  HENT:     Head: Normocephalic and atraumatic.     Right Ear: External ear normal.     Left Ear: External ear normal.     Nose: Nose normal.     Mouth/Throat:     Mouth: Mucous membranes are moist.  Eyes:     General: No scleral icterus.       Right eye: No discharge.        Left eye: No discharge.     Extraocular Movements: Extraocular movements intact.  Cardiovascular:     Rate and Rhythm: Normal rate and regular rhythm.     Heart sounds: Normal heart sounds. No murmur heard.    No friction rub. No gallop.  Pulmonary:     Effort: Pulmonary effort is normal. No respiratory distress.  Breath sounds: No stridor. No wheezing, rhonchi or rales.  Chest:     Chest wall: No tenderness.   Skin:    General: Skin is warm and dry.  Neurological:     General: No focal deficit present.     Mental Status: She is alert and oriented to person, place, and time.  Psychiatric:        Mood and Affect: Mood normal.        Behavior: Behavior normal.    DG Chest 2 View Result Date: 06/01/2024 CLINICAL DATA:  shob, productive cough EXAM: CHEST - 2 VIEW COMPARISON:  None available. FINDINGS: Low lung volumes. No focal airspace consolidation, pleural effusion, or pneumothorax. No cardiomegaly. No acute fracture or destructive lesion. IMPRESSION: Low lung volumes.  Otherwise, no acute cardiopulmonary abnormality. Electronically Signed   By: Rogelia Myers M.D.   On: 06/01/2024 10:16     Results for orders placed or performed during the hospital encounter of 06/01/24 (from the past 24 hours)  POCT CBG (manual entry)     Status: Abnormal   Collection Time: 06/01/24  9:29 AM  Result Value Ref Range   POCT Glucose ([facility]) 222 (A) 70 - 99 mg/dL    CT Angio Chest PE W and/or Wo Contrast Result Date: 05/28/2024 CLINICAL DATA:  Shortness of breath. Worsening dyspnea on exertion. Pulmonary embolism (PE) suspected, high prob LUQ pain, h/o spleenomegaly EXAM: CT ANGIOGRAPHY CHEST CT ABDOMEN AND PELVIS WITH CONTRAST TECHNIQUE: Multidetector CT imaging of the chest was performed using the standard protocol during bolus administration of intravenous contrast. Multiplanar CT image reconstructions and MIPs were obtained to evaluate the vascular anatomy. Multidetector CT imaging of the abdomen and pelvis was performed using the standard protocol during bolus administration of intravenous contrast. RADIATION DOSE REDUCTION: This exam was performed according to the departmental dose-optimization program which includes automated exposure control, adjustment of the mA and/or kV according to patient size and/or use of iterative reconstruction technique. CONTRAST:  75mL OMNIPAQUE  IOHEXOL  350 MG/ML SOLN COMPARISON:  CT  abdomen pelvis 05/21/2024, CT abdomen pelvis 05/07/2023 FINDINGS: CTA CHEST FINDINGS Cardiovascular: Preferential opacification of the thoracic aorta. No evidence of thoracic aortic aneurysm or dissection. Normal heart size. No significant pericardial effusion. No atherosclerotic plaque of the thoracic aorta. No coronary artery calcifications. No central pulmonary embolus. Limited evaluation more distally due to timing of contrast. Mediastinum/Nodes: No enlarged mediastinal, hilar, or axillary lymph nodes. Thyroid  gland, trachea, and esophagus demonstrate no significant findings. Lungs/Pleura: No focal consolidation. No pulmonary nodule. No pulmonary mass. No pleural effusion. No pneumothorax. Musculoskeletal: No chest wall abnormality. No suspicious lytic or blastic osseous lesions. No acute displaced fracture. Multilevel degenerative changes of the spine. Review of the MIP images confirms the above findings. CT ABDOMEN and PELVIS FINDINGS Hepatobiliary: Vague hypodensity of the left hepatic lobe along the false form ligament likely focal fatty infiltration (2:31). Hypodensity of the liver along the gallbladder fossa surgical bed likely postsurgical changes (8:166). Status post cholecystectomy. No biliary dilatation. Pancreas: No focal lesion. Normal pancreatic contour. No surrounding inflammatory changes. No main pancreatic ductal dilatation. Spleen: Normal in size without focal abnormality. Adrenals/Urinary Tract: No adrenal nodule bilaterally. Bilateral kidneys enhance symmetrically. No hydronephrosis. No hydroureter. The urinary bladder is unremarkable. Stomach/Bowel: Stomach is within normal limits. No evidence of bowel wall thickening or dilatation. Appendix appears normal. Vascular/Lymphatic: No abdominal aorta or iliac aneurysm. No abdominal, pelvic, or inguinal lymphadenopathy. Reproductive: Uterus and bilateral adnexa are unremarkable. Other: No intraperitoneal free fluid. No intraperitoneal free  gas. No  organized fluid collection. Musculoskeletal: No abdominal wall hernia or abnormality. No suspicious lytic or blastic osseous lesions. No acute displaced fracture. Review of the MIP images confirms the above findings. IMPRESSION: 1. No central pulmonary embolus. Limited evaluation more distally due to timing of contrast. 2. No acute intrathoracic, intra-abdominal, intrapelvic abnormality. Electronically Signed   By: Morgane  Naveau M.D.   On: 05/28/2024 22:18   CT ABDOMEN PELVIS W CONTRAST Result Date: 05/28/2024 CLINICAL DATA:  Shortness of breath. Worsening dyspnea on exertion. Pulmonary embolism (PE) suspected, high prob LUQ pain, h/o spleenomegaly EXAM: CT ANGIOGRAPHY CHEST CT ABDOMEN AND PELVIS WITH CONTRAST TECHNIQUE: Multidetector CT imaging of the chest was performed using the standard protocol during bolus administration of intravenous contrast. Multiplanar CT image reconstructions and MIPs were obtained to evaluate the vascular anatomy. Multidetector CT imaging of the abdomen and pelvis was performed using the standard protocol during bolus administration of intravenous contrast. RADIATION DOSE REDUCTION: This exam was performed according to the departmental dose-optimization program which includes automated exposure control, adjustment of the mA and/or kV according to patient size and/or use of iterative reconstruction technique. CONTRAST:  75mL OMNIPAQUE  IOHEXOL  350 MG/ML SOLN COMPARISON:  CT abdomen pelvis 05/21/2024, CT abdomen pelvis 05/07/2023 FINDINGS: CTA CHEST FINDINGS Cardiovascular: Preferential opacification of the thoracic aorta. No evidence of thoracic aortic aneurysm or dissection. Normal heart size. No significant pericardial effusion. No atherosclerotic plaque of the thoracic aorta. No coronary artery calcifications. No central pulmonary embolus. Limited evaluation more distally due to timing of contrast. Mediastinum/Nodes: No enlarged mediastinal, hilar, or axillary lymph nodes. Thyroid   gland, trachea, and esophagus demonstrate no significant findings. Lungs/Pleura: No focal consolidation. No pulmonary nodule. No pulmonary mass. No pleural effusion. No pneumothorax. Musculoskeletal: No chest wall abnormality. No suspicious lytic or blastic osseous lesions. No acute displaced fracture. Multilevel degenerative changes of the spine. Review of the MIP images confirms the above findings. CT ABDOMEN and PELVIS FINDINGS Hepatobiliary: Vague hypodensity of the left hepatic lobe along the false form ligament likely focal fatty infiltration (2:31). Hypodensity of the liver along the gallbladder fossa surgical bed likely postsurgical changes (8:166). Status post cholecystectomy. No biliary dilatation. Pancreas: No focal lesion. Normal pancreatic contour. No surrounding inflammatory changes. No main pancreatic ductal dilatation. Spleen: Normal in size without focal abnormality. Adrenals/Urinary Tract: No adrenal nodule bilaterally. Bilateral kidneys enhance symmetrically. No hydronephrosis. No hydroureter. The urinary bladder is unremarkable. Stomach/Bowel: Stomach is within normal limits. No evidence of bowel wall thickening or dilatation. Appendix appears normal. Vascular/Lymphatic: No abdominal aorta or iliac aneurysm. No abdominal, pelvic, or inguinal lymphadenopathy. Reproductive: Uterus and bilateral adnexa are unremarkable. Other: No intraperitoneal free fluid. No intraperitoneal free gas. No organized fluid collection. Musculoskeletal: No abdominal wall hernia or abnormality. No suspicious lytic or blastic osseous lesions. No acute displaced fracture. Review of the MIP images confirms the above findings. IMPRESSION: 1. No central pulmonary embolus. Limited evaluation more distally due to timing of contrast. 2. No acute intrathoracic, intra-abdominal, intrapelvic abnormality. Electronically Signed   By: Morgane  Naveau M.D.   On: 05/28/2024 22:18   DG Chest Port 1 View Result Date:  05/28/2024 CLINICAL DATA:  Shortness of breath.  Worsening dyspnea on exertion. EXAM: PORTABLE CHEST 1 VIEW COMPARISON:  Radiograph 05/21/2024 lung bases from abdominal CT 05/21/2024 FINDINGS: Low lung volumes. Normal heart size for technique.The cardiomediastinal contours are normal. The lungs are clear. Pulmonary vasculature is normal. No consolidation, pleural effusion, or pneumothorax. No acute osseous abnormalities are seen. IMPRESSION: Low lung volumes  without acute chest finding. Electronically Signed   By: Andrea Gasman M.D.   On: 05/28/2024 19:36   CT ABDOMEN PELVIS WO CONTRAST Result Date: 05/21/2024 CLINICAL DATA:  History of lupus. Multiple complaints today. Bright red vomiting. Nose bleed. Abdominal pain for the last few days. Shortness of breath and chest pain since vomiting. EXAM: CT ABDOMEN AND PELVIS WITHOUT CONTRAST TECHNIQUE: Multidetector CT imaging of the abdomen and pelvis was performed following the standard protocol without IV contrast. RADIATION DOSE REDUCTION: This exam was performed according to the departmental dose-optimization program which includes automated exposure control, adjustment of the mA and/or kV according to patient size and/or use of iterative reconstruction technique. COMPARISON:  CT abdomen pelvis 05/07/2023 FINDINGS: Lower chest: No acute abnormality. Hepatobiliary: Unremarkable liver. Cholecystectomy. No biliary dilation. Pancreas: Unremarkable. Spleen: Unremarkable. Adrenals/Urinary Tract: Normal adrenal glands. No urinary calculi or hydronephrosis. Bladder is unremarkable. Stomach/Bowel: Normal caliber large and small bowel. No bowel wall thickening. The appendix is normal.Stomach is within normal limits. Vascular/Lymphatic: No significant vascular findings are present. No enlarged abdominal or pelvic lymph nodes. Reproductive: Unremarkable. Other: No free intraperitoneal fluid or air. Small fat containing umbilical hernia. Musculoskeletal: No acute fracture.  IMPRESSION: No acute abnormality in the abdomen or pelvis. Electronically Signed   By: Norman Gatlin M.D.   On: 05/21/2024 21:27   DG Chest Portable 1 View Result Date: 05/21/2024 CLINICAL DATA:  Vomiting blood, chest pain EXAM: PORTABLE CHEST 1 VIEW COMPARISON:  Radiograph and CT 12/01/2023 FINDINGS: The heart size and mediastinal contours are within normal limits. Both lungs are clear. The visualized skeletal structures are unremarkable. IMPRESSION: No active disease. Electronically Signed   By: Norman Gatlin M.D.   On: 05/21/2024 19:10   Recent Results (from the past 2160 hours)  Aerobic Culture w Gram Stain (superficial specimen)     Status: None   Collection Time: 04/08/24  3:43 PM   Specimen: Abscess  Result Value Ref Range   Specimen Description ABSCESS    Special Requests NONE    Gram Stain      RARE WBC PRESENT, PREDOMINANTLY PMN RARE GRAM NEGATIVE RODS Performed at [facility], [address], [place], [place] [address]    Culture      RARE PROTEUS MIRABILIS RARE STAPHYLOCOCCUS HAEMOLYTICUS    Report Status 04/13/2024 FINAL    Organism ID, Bacteria PROTEUS MIRABILIS    Organism ID, Bacteria STAPHYLOCOCCUS HAEMOLYTICUS       Susceptibility   Proteus mirabilis - MIC*    AMPICILLIN <=2 SENSITIVE Sensitive     CEFEPIME 0.25 SENSITIVE Sensitive     CEFTAZIDIME <=1 SENSITIVE Sensitive     CEFTRIAXONE  <=0.25 SENSITIVE Sensitive     CIPROFLOXACIN <=0.25 SENSITIVE Sensitive     GENTAMICIN <=1 SENSITIVE Sensitive     IMIPENEM 4 SENSITIVE Sensitive     TRIMETH /SULFA  <=20 SENSITIVE Sensitive     AMPICILLIN/SULBACTAM <=2 SENSITIVE Sensitive     PIP/TAZO <=4 SENSITIVE Sensitive ug/mL    * RARE PROTEUS MIRABILIS   Staphylococcus haemolyticus - MIC*    CIPROFLOXACIN <=0.5 SENSITIVE Sensitive     ERYTHROMYCIN >=8 RESISTANT Resistant     GENTAMICIN <=0.5 SENSITIVE Sensitive     OXACILLIN >=4 RESISTANT Resistant     TETRACYCLINE >=16 RESISTANT Resistant     VANCOMYCIN 1  SENSITIVE Sensitive     TRIMETH /SULFA  <=10 SENSITIVE Sensitive     CLINDAMYCIN >=8 RESISTANT Resistant     RIFAMPIN <=0.5 SENSITIVE Sensitive     Inducible Clindamycin NEGATIVE  Sensitive     * RARE STAPHYLOCOCCUS HAEMOLYTICUS  Sedimentation rate     Status: Abnormal   Collection Time: 04/27/24 11:08 AM  Result Value Ref Range   Sed Rate 62 (H) 0 - 20 mm/h  C-reactive protein     Status: Abnormal   Collection Time: 04/27/24 11:08 AM  Result Value Ref Range   CRP 26.9 (H) <8.0 mg/L  C3 and C4     Status: Abnormal   Collection Time: 04/27/24 11:08 AM  Result Value Ref Range   C3 Complement 225 (H) 83 - 193 mg/dL   C4 Complement 32 15 - 57 mg/dL  Lipase, blood     Status: None   Collection Time: 05/21/24  8:03 PM  Result Value Ref Range   Lipase 34 11 - 51 U/L    Comment: Performed at [facility], [address], [place], [place] [address]  Comprehensive metabolic panel     Status: Abnormal   Collection Time: 05/21/24  8:03 PM  Result Value Ref Range   Sodium 138 135 - 145 mmol/L   Potassium 3.8 3.5 - 5.1 mmol/L   Chloride 103 98 - 111 mmol/L   CO2 24 22 - 32 mmol/L   Glucose, Bld 173 (H) 70 - 99 mg/dL    Comment: Glucose reference range applies only to samples taken after fasting for at least 8 hours.   BUN 8 6 - 20 mg/dL   Creatinine, Ser 8.95 (H) 0.44 - 1.00 mg/dL   Calcium  9.2 8.9 - 10.3 mg/dL   Total Protein 7.7 6.5 - 8.1 g/dL   Albumin 3.6 3.5 - 5.0 g/dL   AST 14 (L) 15 - 41 U/L   ALT 17 0 - 44 U/L   Alkaline Phosphatase 98 38 - 126 U/L   Total Bilirubin 0.7 0.0 - 1.2 mg/dL   GFR, Estimated >39 >39 mL/min    Comment: (NOTE) Calculated using the CKD-EPI Creatinine Equation (2021)    Anion gap 11 5 - 15    Comment: Performed at [facility], [address], [place], [place] [address]  CBC     Status: Abnormal   Collection Time: 05/21/24  8:03 PM  Result Value Ref Range   WBC 11.0 (H) 4.0 - 10.5 K/uL   RBC 5.10 3.87 - 5.11  MIL/uL   Hemoglobin 13.4 12.0 - 15.0 g/dL   HCT 56.5 63.9 - 53.9 %   MCV 85.1 80.0 - 100.0 fL   MCH 26.3 26.0 - 34.0 pg   MCHC 30.9 30.0 - 36.0 g/dL   RDW 85.9 88.4 - 84.4 %   Platelets 285 150 - 400 K/uL   nRBC 0.0 0.0 - 0.2 %    Comment: Performed at [facility], [address] [place], [place] [address]  hCG, serum, qualitative     Status: None   Collection Time: 05/21/24  8:03 PM  Result Value Ref Range   Preg, Serum NEGATIVE NEGATIVE    Comment:        THE SENSITIVITY OF THIS METHODOLOGY IS >10 mIU/mL. Performed at [facility], [address] [place], [place] [address]   Troponin I (High Sensitivity)     Status: None   Collection Time: 05/21/24  8:03 PM  Result Value Ref Range   Troponin I (High Sensitivity) 2 <18 ng/L    Comment: (NOTE) Elevated high sensitivity troponin I (hsTnI) values and significant  changes across serial measurements may suggest ACS  but many other  chronic and acute conditions are known to elevate hsTnI results.  Refer to the Links section for chest pain algorithms and additional  guidance. Performed at [facility], [address], [place], [place] [address]   hCG, serum, qualitative     Status: None   Collection Time: 05/28/24  7:19 PM  Result Value Ref Range   Preg, Serum NEGATIVE NEGATIVE    Comment:        THE SENSITIVITY OF THIS METHODOLOGY IS >10 mIU/mL. Performed at [facility], [address], [place], [place] [address]   Lipase, blood     Status: None   Collection Time: 05/28/24  7:19 PM  Result Value Ref Range   Lipase 31 11 - 51 U/L    Comment: Performed at [facility], [address], [place], [place] [address]  Comprehensive metabolic panel     Status: Abnormal   Collection Time: 05/28/24  7:19 PM  Result Value Ref Range   Sodium 136 135 - 145 mmol/L   Potassium 3.7 3.5 - 5.1 mmol/L   Chloride 104 98 - 111 mmol/L   CO2 21  (L) 22 - 32 mmol/L   Glucose, Bld 201 (H) 70 - 99 mg/dL    Comment: Glucose reference range applies only to samples taken after fasting for at least 8 hours.   BUN 8 6 - 20 mg/dL   Creatinine, Ser 9.28 0.44 - 1.00 mg/dL   Calcium  9.4 8.9 - 10.3 mg/dL   Total Protein 7.7 6.5 - 8.1 g/dL   Albumin 3.7 3.5 - 5.0 g/dL   AST 20 15 - 41 U/L   ALT 18 0 - 44 U/L   Alkaline Phosphatase 100 38 - 126 U/L   Total Bilirubin 0.8 0.0 - 1.2 mg/dL   GFR, Estimated >39 >39 mL/min    Comment: (NOTE) Calculated using the CKD-EPI Creatinine Equation (2021)    Anion gap 11 5 - 15    Comment: Performed at [facility], [address] [place], [place] [address]  CBC     Status: Abnormal   Collection Time: 05/28/24  7:19 PM  Result Value Ref Range   WBC 9.6 4.0 - 10.5 K/uL   RBC 5.52 (H) 3.87 - 5.11 MIL/uL   Hemoglobin 14.1 12.0 - 15.0 g/dL   HCT 52.9 (H) 63.9 - 53.9 %   MCV 85.1 80.0 - 100.0 fL   MCH 25.5 (L) 26.0 - 34.0 pg   MCHC 30.0 30.0 - 36.0 g/dL   RDW 86.0 88.4 - 84.4 %   Platelets 307 150 - 400 K/uL   nRBC 0.0 0.0 - 0.2 %    Comment: Performed at [facility], [address], [place], [place] [address]  Brain natriuretic peptide     Status: None   Collection Time: 05/28/24  7:19 PM  Result Value Ref Range   B Natriuretic Peptide 19.8 0.0 - 100.0 pg/mL    Comment: Performed at [facility], [address], [place], [place] [address]  Troponin I (High Sensitivity)     Status: None   Collection Time: 05/28/24  7:19 PM  Result Value Ref Range   Troponin I (High Sensitivity) <2 <18 ng/L    Comment: (NOTE) Elevated high sensitivity troponin I (hsTnI) values and significant  changes across serial measurements may suggest ACS but many other  chronic and acute conditions are known to elevate hsTnI results.  Refer  to the Links section for chest pain algorithms and additional  guidance. Performed at [facility], [address] [place], [place] [address]   D-dimer, quantitative     Status: None   Collection Time: 05/28/24  7:21 PM  Result Value Ref Range   D-Dimer, Quant 0.32 0.00 - 0.50 ug/mL-FEU    Comment: (NOTE) At the manufacturer cut-off value of 0.5 g/mL FEU, this assay has a negative predictive value of 95-100%.This assay is intended for use in conjunction with a clinical pretest probability (PTP) assessment model to exclude pulmonary embolism (PE) and deep venous thrombosis (DVT) in outpatients suspected of PE or DVT. Results should be correlated with clinical presentation. Performed at [facility], [address] [place], [place] [address]   Troponin I (High Sensitivity)     Status: None   Collection Time: 05/28/24 11:27 PM  Result Value Ref Range   Troponin I (High Sensitivity) <2 <18 ng/L    Comment: (NOTE) Elevated high sensitivity troponin I (hsTnI) values and significant  changes across serial measurements may suggest ACS but many other  chronic and acute conditions are known to elevate hsTnI results.  Refer to the Links section for chest pain algorithms and additional  guidance. Performed at [facility], [address], [place], [place] [address]   Urinalysis, Routine w reflex microscopic -Urine, Clean Catch     Status: None   Collection Time: 05/28/24 11:35 PM  Result Value Ref Range   Color, Urine YELLOW YELLOW   APPearance CLEAR CLEAR   Specific Gravity, Urine 1.005 1.005 - 1.030   pH 6.0 5.0 - 8.0   Glucose, UA NEGATIVE NEGATIVE mg/dL   Hgb urine dipstick NEGATIVE NEGATIVE   Bilirubin Urine NEGATIVE NEGATIVE   Ketones, ur NEGATIVE NEGATIVE mg/dL   Protein, ur NEGATIVE NEGATIVE mg/dL   Nitrite NEGATIVE NEGATIVE   Leukocytes,Ua NEGATIVE NEGATIVE    Comment: Performed at [facility], [address], [place], [place] [address]  POCT CBG (manual entry)     Status: Abnormal   Collection Time: 06/01/24  9:29  AM  Result Value Ref Range   POCT Glucose ([facility]) 222 (A) 70 - 99 mg/dL     Assessment and Plan :   PDMP not reviewed this encounter.  1. Viral respiratory infection   2. Productive cough   3. Moderate persistent asthma with acute exacerbation    I emphasized to have close follow-up with her cardiologist as recommended previously through the emergency room.  Patient reports that she had a visit about a month ago and is not sure about her follow-up.  With her current symptoms, I recommended following through with the dosing of prednisone  provided by her PCP.  She has very valid concerns about the increases in her blood sugar and therefore I offered the use of glipizide , currently with this prednisone  course.  Emphasized close follow-up with her PCP.  Patient declines supportive care medications that are not covered by Medicaid.  Maintain all other treatments as deemed appropriate by her PCP.  Counseled patient on potential for adverse effects with medications prescribed/recommended today, ER and return-to-clinic precautions discussed, patient verbalized understanding.    Christopher Savannah, [place] 06/01/24 1035

## 2024-06-01 NOTE — ED Triage Notes (Signed)
 Pt reports asthma flared up, wheezing and pain in  left lung x 5 days. Symbicort  and albuterol  inhaler gives some relief. Pt went to the ED yesterday and had a prednisone  shot without relief. Pt reports she was tod at the ED she needs to follow up to check her heart.

## 2024-06-01 NOTE — Discharge Instructions (Addendum)
 Please go ahead and start the prednisone  to help with your breathing and lower respiratory discomfort. To counter the increases in your blood sugar from taking steroids, I recommend using glipizide  twice daily. You can revisit the need for this medication with your PCP.

## 2024-06-09 ENCOUNTER — Ambulatory Visit: Admitting: Nurse Practitioner

## 2024-06-21 ENCOUNTER — Other Ambulatory Visit (HOSPITAL_COMMUNITY): Payer: Self-pay | Admitting: Internal Medicine

## 2024-06-21 DIAGNOSIS — R0602 Shortness of breath: Secondary | ICD-10-CM

## 2024-06-22 NOTE — Progress Notes (Signed)
 Office Visit Note  Patient: Jade Lopez             Date of Birth: [birth year]           MRN: [id number]             PCP: Jerel Gee, NP Referring: Jerel Gee, NP Visit Date: 07/06/2024   Subjective:  Follow-up (Change in treatment )  Discussed the use of AI scribe software for clinical note transcription with the patient, who gave verbal consent to proceed.  History of Present Illness   Jade Lopez is a 28 y.o. female here for follow up with chronic pain in multiple areas multifactorial with FMS, thoracic spine osteoarthitis, and history of lupus recently seronegative for inflammatory arthritis    She experiences worsening joint pain, particularly in her back and left shoulder. The shoulder pain has been severe in the past, limiting her ability to use it, but currently, it is only mildly painful. She also describes pain on the left side, possibly around the rib cage, extending to the back.  She has been taking Lyrica  50 mg three times a day for her pain, which she has been on for years. S Her past medical history includes a diagnosis of systemic lupus.  No increased swelling, rashes, skin breakouts, bumps, or cysts. She is currently on norethindrone  and receives Depo shots for contraception. No change in symptoms reported after starting course of oral doxycycline .     Previous HPI 04/27/2024 Jade Lopez is a 28 y.o. female here for follow up with chronic pain in multiple areas multifactorial with FMS, thoracic spine osteoarthitis, and history of lupus recently seronegative for inflammatory arthritis. She presents with recurrent skin rashes and asthma who presents with weakness and body aches.   She has been experiencing weakness and body aches for the past two to three days, which she attributes to a flare-up of her condition rather than an acute illness. No recent history of upper respiratory or viral illnesses.   She has a history of recurrent skin rashes, primarily  affecting the center of her face, causing burning sensations described as 'like somebody lit my face on fire.' She has been documenting these rashes with photographs and has been taking antibiotics for skin abscesses. The rashes also appear elsewhere but are less severe. Currently, there are no new rashes under her arms or on her abdomen.   She experiences swelling in her feet and hands, which she describes as typical for her condition. No color changes in her hands or fingers.   Her asthma symptoms have been worsening, potentially related to mold in her apartment. She has consulted an allergist, who suggested allergies might be a factor, although she does not typically have issues with pollen. Further testing for mold exposure is pending.         Previous HPI 01/13/2024 Jade Lopez is a 28 y.o. female here for follow up with chronic pain in multiple areas multifactorial with FMS, thoracic spine osteoarthitis, and history of lupus recently seronegative for inflammatory arthritis.    She experiences chronic joint pain primarily in her knees, shoulders, and back, with recent flare-ups increasing in frequency and severity. She has a history of a back fracture, which is currently stable. No new falls or injuries have occurred. No new skin rashes or changes, and no significant back pain beyond her usual discomfort.   She took hydroxychloroquine  for several months without significant improvement. She is prescribed Lyrica  at 50 mg three times a day,  which she has been on for about a year. She also uses naproxen  and Tylenol  as needed for pain relief. Cyclobenzaprine  is taken occasionally but is not sure about use along with lyrica .   She experiences facial swelling and occasional sores in her mouth, specifically on the inner side of her jaw, which are sometimes painful. She uses salt water rinses to manage these sores. Swelling is noted in her face but not more than usual in her legs.   She recently  felt unwell with wheezing, for which she visited her primary care provider. X-rays were normal, but she continued to feel unwell. Prednisone  was given both in the emergency room and by her primary care provider, but she did not experience significant relief. She eventually started to feel better over time. No new respiratory infections.      Previous HPI 10/25/2023 Jade Lopez is a 28 y.o. female here for follow up with history of lupus. They have completed a course of antibiotics for the breast infection. They were started on hydroxychloroquine  400 mg daily at the last visit to manage the joint inflammation. They report a slight improvement in symptoms, particularly in the left leg, which is now pain-free. However, they also note an increase in shoulder pain. They have experienced some nausea but are unsure if it is related to the medication. They deny any new illnesses or rashes, but mention a small light spot on the left thigh.   The patient also reports persistent pain in both shoulders, which has recently increased. The pain radiates down the arm but does not cause numbness or affect grip strength. They also report occasional itching and burning in the ears.   Previous HPI 08/30/2023 Jade Lopez is a 28 y.o. female here for follow up and with history of lupus.  Workup at our initial visit was negative for any disease specific antibodies but did have moderately elevated sedimentation rate and CRP.  Still having a lot of body pains and stiffness not localized very well to specific joints.  Still seeing leg swelling worse in the evening.  Worsening of the right breast cysts not diagnosed with abscess for which it sounds like she is going to need some drainage or debridement.   Previous HPI 07/29/23 Jade Lopez is a 28 y.o. female here for evaluation and management with history of lupus.  She was not seeing any rheumatologist previously had been managed through primary care office with  prolonged oral prednisone  treatment.  From her history sounds like original diagnosis was based on joint inflammation and abnormal serology.  And more recently reviewed notes may have also experienced a course of intermittent fevers without clear underlying cause.  Also previous diagnosis of fibromyalgia syndrome and takes Lyrica  50 mg 3 times daily.  Currently has joint pain in multiple areas mostly worse on her right side throughout upper extremity and in the right knee.  She previously fractured the right ankle does not have as much pain in the site as an others.  She gets swelling in multiple areas legs are worse by the evening time. Besides joint pain multiple areas she has some frequent or recurrent skin lesions sounds like cysts or small abscesses in the axilla groin and breast areas.  Saw dermatology evaluation at some point and found to have some type of dermatitis but was not diagnosed as hidradenitis. Has chronic eye and mouth dryness not associated with dental complications vision change or eye inflammation.  Reports some hair loss or thinning not associated  with any scalp rash or specific distribution. She does have chronic fatigue, concentration difficulty, dizziness, headaches, chronic diarrhea.     12/2022 ANA neg dsDNA neg    Review of Systems  Constitutional:  Positive for fatigue.  HENT:  Positive for mouth dryness. Negative for mouth sores.   Eyes:  Positive for dryness.  Respiratory:  Positive for shortness of breath.   Cardiovascular:  Positive for chest pain. Negative for palpitations.  Gastrointestinal:  Positive for diarrhea. Negative for blood in stool and constipation.  Endocrine: Negative for increased urination.  Genitourinary:  Negative for involuntary urination.  Musculoskeletal:  Positive for joint pain, gait problem, joint pain, joint swelling, myalgias, muscle weakness, morning stiffness, muscle tenderness and myalgias.  Skin:  Positive for color change and  sensitivity to sunlight. Negative for rash and hair loss.  Allergic/Immunologic: Negative for susceptible to infections.  Neurological:  Positive for dizziness. Negative for headaches.  Hematological:  Negative for swollen glands.  Psychiatric/Behavioral:  Positive for depressed mood and sleep disturbance. The patient is nervous/anxious.     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   Chronic post-traumatic headache, not intractable 10/28/2023   Hypersomnia with sleep apnea 10/28/2023   Visual changes 10/28/2023   Abnormal laboratory test result 07/29/2023   Traumatic brain injury (HCC) 07/14/2023   History of nausea and vomiting 05/19/2023   Fibromyalgia 04/11/2023   Abscess of right breast 04/03/2023   Abdominal wall pain 03/01/2023   Sleep related headaches 02/24/2023   OSA on CPAP 02/24/2023   Sleeps in sitting position due to orthopnea 02/24/2023   Menorrhagia 01/28/2023   Healthcare maintenance 01/28/2023   Chest pain 01/28/2023   Back pain 01/28/2023   Type 2 diabetes mellitus (HCC) 11/28/2022   History of systemic lupus erythematosus (HCC) 11/28/2022   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/28/2022   Endometriosis 11/28/2022   Anxiety 11/28/2022   Vitamin D  deficiency 09/13/2012   Asthma 08/22/2012    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   T2DM (type 2 diabetes mellitus) (HCC)    TOA (tubo-ovarian abscess)     Family History  Problem Relation Age of Onset   Diabetes Mother    Hypertension Mother    Migraines Mother    Diabetes Father    Hypertension Father    Sleep apnea Father    Past Surgical History:  Procedure Laterality Date    BREAST LUMPECTOMY Right 2024   CHOLECYSTECTOMY     DILATATION AND CURETTAGE/HYSTEROSCOPY WITH MINERVA     3 total   INCISION AND DRAINAGE ABSCESS  2021   buttocks   IR LUMBAR PUNCTURE  11/15/2023   RADIOLOGY WITH ANESTHESIA N/A 11/15/2023   Procedure: Lumbar Puncture;  Surgeon: Karalee Wilkie POUR, MD;  Location: [facility] OR;  Service: Radiology;  Laterality: N/A;   TONSILLECTOMY AND ADENOIDECTOMY     Social History   Social History Narrative   Pt lives alone    Pt not working    Immunization History  Administered Date(s) Administered   Influenza, Seasonal, Injecte, Preservative Fre 08/08/2008   PPD  Test 10/07/2022   Td 07/17/2015     Objective: Vital Signs: BP 103/69 (BP Location: Left Arm, Patient Position: Sitting, Cuff Size: Normal) Comment (BP Location): lower arm  Pulse 92   Resp 17   Ht 5' 4.5 (1.638 m)   Wt (!) 405 lb 9.6 oz (184 kg)   BMI 68.55 kg/m    Physical Exam Constitutional:      Appearance: She is obese.  Eyes:     Conjunctiva/sclera: Conjunctivae normal.  Cardiovascular:     Rate and Rhythm: Normal rate and regular rhythm.  Pulmonary:     Effort: Pulmonary effort is normal.     Breath sounds: Normal breath sounds.  Lymphadenopathy:     Cervical: No cervical adenopathy.  Skin:    General: Skin is warm and dry.     Findings: Rash present.     Comments: Patches of scaly skin along medial border of eyebrows, on cheeks, no erythema   Neurological:     Mental Status: She is alert.  Psychiatric:        Mood and Affect: Mood normal.      Musculoskeletal Exam:  Shoulders full ROM some pain with movement but intact, no focal tenderness Elbows full ROM no tenderness or swelling Wrists full ROM no tenderness or swelling Fingers full ROM no tenderness or swelling Extensive muscular tenderness to pressure throughout neck, back, arms, and legs Knees full ROM no tenderness or swelling, right knee patellofemoral crepitus  Investigation: No additional  findings.  Imaging: No results found.   Recent Labs: Lab Results  Component Value Date   WBC 9.6 05/28/2024   HGB 14.1 05/28/2024   PLT 307 05/28/2024   NA 136 05/28/2024   K 3.7 05/28/2024   CL 104 05/28/2024   CO2 21 (L) 05/28/2024   GLUCOSE 201 (H) 05/28/2024   BUN 8 05/28/2024   CREATININE 0.71 05/28/2024   BILITOT 0.8 05/28/2024   ALKPHOS 100 05/28/2024   AST 20 05/28/2024   ALT 18 05/28/2024   PROT 7.7 05/28/2024   ALBUMIN 3.7 05/28/2024   CALCIUM  9.4 05/28/2024    Speciality Comments: No specialty comments available.  Procedures:  No procedures performed Allergies: Clindamycin/lincomycin, Gabapentin, Other, Pregabalin , Cephalexin , Gadolinium derivatives, Iodinated contrast media, Meloxicam, and Clindamycin   Assessment / Plan:     Visit Diagnoses: Seronegative rheumatoid arthritis (HCC) - Plan: Sedimentation rate Chronic joint pain in back, left shoulder, and rib cage. Systemic lupus not supported by tests. Persistent inflammation without lupus markers. Current treatment ineffective after trying HCQ and oral tetracyclines. Considering stronger medication she has not taken methotrexate nor any biologic DMARD. However still not sure how much symptoms are really explained by inflammation, if not seeing more difference after another trial suggest it is more her FMS. - Initiate biologic rheumatoid arthritis medication with first dose in clinic to monitor for adverse reactions. - Schedule pharmacist appointment for first dose administration. - Provide drug information and handouts. - Order blood tests before starting new medication.  Fibromyalgia - Plan: pregabalin  (LYRICA ) 75 MG capsule Has completely widespread pain conservators in multiple areas not closely linked in a joint pattern.  She has been on Lyrica  50 mg 3 times daily may benefit with dose increase otherwise consider change in medication.  High risk medication use - Plan: CBC with Differential/Platelet,  Comprehensive metabolic panel with GFR, Hepatitis B core antibody, IgM, Hepatitis B surface antigen, Hepatitis C antibody, QuantiFERON-TB Gold Plus Reviewed risk of methotrexate including GI intolerance, cytopenias, hepatotoxicity, infections,  and teratogenicity.  She is on multiple birth control. - Checking baseline labs for trial of methotrexate clinic CBC CMP hepatitis also getting TB screening if needing to try biologic DMARD  Orders: Orders Placed This Encounter  Procedures   CBC with Differential/Platelet   Comprehensive metabolic panel with GFR   Hepatitis B core antibody, IgM   Hepatitis B surface antigen   Hepatitis C antibody   QuantiFERON-TB Gold Plus   Sedimentation rate   Meds ordered this encounter  Medications   pregabalin  (LYRICA ) 75 MG capsule    Sig: Take 1 capsule (75 mg total) by mouth 3 (three) times daily.    Dispense:  90 capsule    Refill:  2    Needs brand-specific Lyrica      Follow-Up Instructions: No follow-ups on file.   Lonni LELON Ester, MD  Note - This record has been created using [employer].  Chart creation errors have been sought, but may not always  have been located. Such creation errors do not reflect on  the standard of medical care.

## 2024-07-06 ENCOUNTER — Ambulatory Visit: Attending: Internal Medicine | Admitting: Internal Medicine

## 2024-07-06 ENCOUNTER — Encounter: Payer: Self-pay | Admitting: Internal Medicine

## 2024-07-06 VITALS — BP 103/69 | HR 92 | Resp 17 | Ht 64.5 in | Wt >= 6400 oz

## 2024-07-06 DIAGNOSIS — L309 Dermatitis, unspecified: Secondary | ICD-10-CM | POA: Insufficient documentation

## 2024-07-06 DIAGNOSIS — Z79899 Other long term (current) drug therapy: Secondary | ICD-10-CM | POA: Insufficient documentation

## 2024-07-06 DIAGNOSIS — M797 Fibromyalgia: Secondary | ICD-10-CM | POA: Insufficient documentation

## 2024-07-06 DIAGNOSIS — M0600 Rheumatoid arthritis without rheumatoid factor, unspecified site: Secondary | ICD-10-CM | POA: Diagnosis present

## 2024-07-06 MED ORDER — PREGABALIN 75 MG PO CAPS: 75.0000 mg | ORAL_CAPSULE | Freq: Three times a day (TID) | ORAL | 2 refills | Status: DC

## 2024-07-13 ENCOUNTER — Telehealth: Payer: Self-pay | Admitting: Internal Medicine

## 2024-07-13 ENCOUNTER — Other Ambulatory Visit: Payer: Self-pay

## 2024-07-13 DIAGNOSIS — M797 Fibromyalgia: Secondary | ICD-10-CM

## 2024-07-13 DIAGNOSIS — Z79899 Other long term (current) drug therapy: Secondary | ICD-10-CM

## 2024-07-13 DIAGNOSIS — R899 Unspecified abnormal finding in specimens from other organs, systems and tissues: Secondary | ICD-10-CM

## 2024-07-13 DIAGNOSIS — M0600 Rheumatoid arthritis without rheumatoid factor, unspecified site: Secondary | ICD-10-CM

## 2024-07-13 NOTE — Telephone Encounter (Signed)
 Patient advised Dr. Jeannetta added a B12 and folate panel to her lab orders. Dr. Jeannetta doesn't have any specific new recommendations since our visit last week until checking these.

## 2024-07-13 NOTE — Telephone Encounter (Signed)
 Pt would like to add on B12 level text to her labs when she comes in. Pt also is having having pain in her ribs and states its swollen. Pt states the pain is always there. Pt is coming in today for labs.

## 2024-07-13 NOTE — Telephone Encounter (Signed)
 I added a B12 and folate panel to her lab orders. I don't have any specific new recommendations since our visit last week until checking these.

## 2024-07-14 LAB — HEPATITIS C ANTIBODY: Hepatitis C Ab: NONREACTIVE

## 2024-07-14 LAB — HEPATITIS B CORE ANTIBODY, IGM: Hep B C IgM: NONREACTIVE

## 2024-07-14 LAB — B12 AND FOLATE PANEL
Folate: 5 ng/mL — ABNORMAL LOW
Vitamin B-12: 1249 pg/mL — ABNORMAL HIGH (ref 200–1100)

## 2024-07-14 LAB — HEPATITIS B SURFACE ANTIGEN: Hepatitis B Surface Ag: NONREACTIVE

## 2024-07-19 LAB — CBC WITH DIFFERENTIAL/PLATELET
Absolute Lymphocytes: 3255 {cells}/uL (ref 850–3900)
Absolute Monocytes: 670 {cells}/uL (ref 200–950)
Basophils Absolute: 31 {cells}/uL (ref 0–200)
Basophils Relative: 0.3 %
Eosinophils Absolute: 216 {cells}/uL (ref 15–500)
Eosinophils Relative: 2.1 %
HCT: 41 % (ref 35.0–45.0)
Hemoglobin: 13 g/dL (ref 11.7–15.5)
MCH: 26.3 pg — ABNORMAL LOW (ref 27.0–33.0)
MCHC: 31.7 g/dL — ABNORMAL LOW (ref 32.0–36.0)
MCV: 82.8 fL (ref 80.0–100.0)
MPV: 10.9 fL (ref 7.5–12.5)
Monocytes Relative: 6.5 %
Neutro Abs: 6129 {cells}/uL (ref 1500–7800)
Neutrophils Relative %: 59.5 %
Platelets: 338 Thousand/uL (ref 140–400)
RBC: 4.95 Million/uL (ref 3.80–5.10)
RDW: 14.3 % (ref 11.0–15.0)
Total Lymphocyte: 31.6 %
WBC: 10.3 Thousand/uL (ref 3.8–10.8)

## 2024-07-19 LAB — SEDIMENTATION RATE: Sed Rate: 53 mm/h — ABNORMAL HIGH (ref 0–20)

## 2024-07-19 LAB — QUANTIFERON-TB GOLD PLUS
Mitogen-NIL: >10 [IU]/mL
NIL: 0.03 [IU]/mL
QuantiFERON-TB Gold Plus: NEGATIVE
TB1-NIL: 0 [IU]/mL
TB2-NIL: 0.01 [IU]/mL

## 2024-07-20 ENCOUNTER — Ambulatory Visit: Admitting: Neurology

## 2024-07-20 ENCOUNTER — Telehealth: Payer: Self-pay | Admitting: Internal Medicine

## 2024-07-20 NOTE — Telephone Encounter (Signed)
 Patient called stating she is experiencing a lot of pain, especially on the left side of her body.  Patient states approximately 3 days ago she began experiencing fatigue and chills.  Patient states she did not call her PCP, because she feels the symptoms are related to her pain and requested a return call.

## 2024-07-31 NOTE — Telephone Encounter (Signed)
 Patient contacted the office for her lab results and what her next steps should be. Please review and advise.

## 2024-08-02 ENCOUNTER — Encounter (HOSPITAL_COMMUNITY): Payer: Self-pay

## 2024-08-03 ENCOUNTER — Telehealth (HOSPITAL_COMMUNITY): Payer: Self-pay | Admitting: *Deleted

## 2024-08-03 NOTE — Telephone Encounter (Signed)
 Reaching out to patient to offer assistance regarding upcoming cardiac imaging study; pt verbalizes understanding of appt date/time, parking situation and where to check in, pre-test and verified current allergies; name and call back number provided for further questions should they arise  Chantal Requena RN Navigator Cardiac Imaging [facility] [phone] office [phone] cell  Patient reports she gets nausea/ vomiting when getting the contrast for MRI. She denies metal or claustrophobia.

## 2024-08-04 ENCOUNTER — Ambulatory Visit (HOSPITAL_COMMUNITY)
Admission: RE | Admit: 2024-08-04 | Discharge: 2024-08-04 | Disposition: A | Discharge status: Home / Self Care | Source: Ambulatory Visit | Attending: Internal Medicine | Admitting: Internal Medicine

## 2024-08-04 ENCOUNTER — Encounter (HOSPITAL_COMMUNITY): Payer: Self-pay

## 2024-08-04 DIAGNOSIS — R0602 Shortness of breath: Secondary | ICD-10-CM | POA: Diagnosis present

## 2024-08-06 ENCOUNTER — Ambulatory Visit: Payer: Self-pay | Admitting: Urgent Care

## 2024-08-06 ENCOUNTER — Ambulatory Visit (INDEPENDENT_AMBULATORY_CARE_PROVIDER_SITE_OTHER)

## 2024-08-06 ENCOUNTER — Ambulatory Visit
Admission: EM | Admit: 2024-08-06 | Discharge: 2024-08-06 | Disposition: A | Discharge status: Home / Self Care | Attending: Family Medicine | Admitting: Family Medicine

## 2024-08-06 DIAGNOSIS — S5012XA Contusion of left forearm, initial encounter: Secondary | ICD-10-CM | POA: Diagnosis not present

## 2024-08-06 DIAGNOSIS — M545 Low back pain, unspecified: Secondary | ICD-10-CM

## 2024-08-06 DIAGNOSIS — W19XXXA Unspecified fall, initial encounter: Secondary | ICD-10-CM

## 2024-08-06 MED ORDER — CYCLOBENZAPRINE HCL 5 MG PO TABS: 5.0000 mg | ORAL_TABLET | Freq: Every evening | ORAL | 0 refills | Status: DC | PRN

## 2024-08-06 MED ORDER — KETOROLAC TROMETHAMINE 30 MG/ML IJ SOLN
30.0000 mg | Freq: Once | INTRAMUSCULAR | Status: AC
  Administered 2024-08-06: 30 mg via INTRAMUSCULAR

## 2024-08-06 MED ORDER — NAPROXEN 375 MG PO TABS: 375.0000 mg | ORAL_TABLET | Freq: Two times a day (BID) | ORAL | 0 refills | Status: DC

## 2024-08-06 NOTE — ED Provider Notes (Signed)
 [facility]  Note:  This document was prepared using Conservation officer, historic buildings and may include unintentional dictation errors.  MRN: [id number] DOB: 02/07/1996  Subjective:   Jade Lopez is a 28 y.o. female presenting for multiple aches and pains following an accidental fall in her shower.  Reports that she slipped on soap causing her right slide awkwardly.  She subsequently hit her head, left upper arm against the wall of the shower.  As she continued to fall, believes she may have impacted the buttock and low back.  Would like a Toradol  injection.  She is not anticoagulated.  Generally avoids NSAIDs but cannot take steroids due to her blood sugar.  Has previously done well with Toradol  and naproxen .  She is in chronic pain management with [facility].  No current facility-administered medications for this encounter.  Current Outpatient Medications:    Accu-Chek Softclix Lancets lancets, daily., Disp: , Rfl:    albuterol  (PROVENTIL ) (2.5 MG/3ML) 0.083% nebulizer solution, Take 3 mLs (2.5 mg total) by nebulization every 6 (six) hours as needed for wheezing or shortness of breath., Disp: 75 mL, Rfl: 0   albuterol  (VENTOLIN  HFA) 108 (90 Base) MCG/ACT inhaler, Inhale 1-2 puffs into the lungs every 6 (six) hours as needed for wheezing or shortness of breath., Disp: 1 each, Rfl: 1   BIOTIN PO, Take 2 capsules by mouth daily. Hair, skin nails, Disp: , Rfl:    Blood Glucose Monitoring Suppl (MM EASY TOUCH GLUCOSE METER) w/Device KIT, Please check your blood sugar daily, in the morning., Disp: 1 kit, Rfl: 0   Blood Glucose Monitoring Suppl DEVI, 1 each by Does not apply route 3 (three) times daily. May dispense any manufacturer covered by patient's insurance., Disp: 1 each, Rfl: 0   budesonide  (PULMICORT ) 1 MG/2ML nebulizer solution, SMARTSIG:2 Milliliter(s) Twice Daily, Disp: , Rfl:    budesonide -formoterol  (SYMBICORT ) 80-4.5 MCG/ACT inhaler, Inhale 2 puffs into  the lungs 2 (two) times daily as needed., Disp: 1 each, Rfl: 12   cetirizine (ZYRTEC) 10 MG tablet, Take 10 mg by mouth daily., Disp: , Rfl:    glipiZIDE  2.5 MG TABS, Take 2.5 mg by mouth 2 (two) times daily before a meal. (Patient not taking: Reported on 07/06/2024), Disp: 30 tablet, Rfl: 0   Glucose Blood (BLOOD GLUCOSE TEST STRIPS) STRP, 1 each by Does not apply route 3 (three) times daily. Use as directed to check blood sugar. May dispense any manufacturer covered by patient's insurance and fits patient's device., Disp: 100 strip, Rfl: 0   ipratropium-albuterol  (DUONEB) 0.5-2.5 (3) MG/3ML SOLN, SMARTSIG:3 Milliliter(s) Every 6-8 Hours PRN, Disp: , Rfl:    Lancet Device MISC, 1 each by Does not apply route 3 (three) times daily. May dispense any manufacturer covered by patient's insurance., Disp: 1 each, Rfl: 0   Lancets MISC, 1 each by Does not apply route 3 (three) times daily. Use as directed to check blood sugar. May dispense any manufacturer covered by patient's insurance and fits patient's device., Disp: 100 each, Rfl: 0   lidocaine  (LIDODERM ) 5 %, Place 1 patch onto the skin daily. Remove & Discard patch within 12 hours or as directed by MD (Patient not taking: Reported on 07/06/2024), Disp: 30 patch, Rfl: 0   medroxyPROGESTERone  (DEPO-PROVERA ) 150 MG/ML injection, Inject 150 mg into the muscle every 3 (three) months., Disp: , Rfl:    MOUNJARO  10 MG/0.5ML Pen, SMARTSIG:10 Milligram(s) SUB-Q Once a Week, Disp: , Rfl:    MOUNJARO  7.5 MG/0.5ML  Pen, Inject into the skin. (Patient not taking: Reported on 07/06/2024), Disp: , Rfl:    Multiple Vitamins-Minerals (ZINC PO), Take 1 tablet by mouth daily., Disp: , Rfl:    naloxone  (NARCAN ) nasal spray 4 mg/0.1 mL, SMARTSIG:Both Nares, Disp: , Rfl:    norethindrone  (AYGESTIN ) 5 MG tablet, Take 10 mg by mouth at bedtime., Disp: , Rfl:    nystatin (MYCOSTATIN) 100000 UNIT/ML suspension, Take 5 mLs by mouth 4 (four) times daily. (Patient not taking: Reported  on 07/06/2024), Disp: , Rfl:    omeprazole (PRILOSEC) 40 MG capsule, Take 40 mg by mouth daily., Disp: , Rfl:    ondansetron  (ZOFRAN -ODT) 8 MG disintegrating tablet, Take 1 tablet (8 mg total) by mouth every 8 (eight) hours as needed for nausea or vomiting., Disp: 20 tablet, Rfl: 0   predniSONE  (DELTASONE ) 20 MG tablet, Take 20 mg by mouth 2 (two) times daily., Disp: , Rfl:    pregabalin  (LYRICA ) 75 MG capsule, Take 1 capsule (75 mg total) by mouth 3 (three) times daily., Disp: 90 capsule, Rfl: 2   promethazine -dextromethorphan (PROMETHAZINE -DM) 6.25-15 MG/5ML syrup, Take 5 mLs by mouth 4 (four) times daily as needed for cough. (Patient not taking: Reported on 07/06/2024), Disp: 118 mL, Rfl: 0   SUMAtriptan  (IMITREX ) 100 MG tablet, Take 1 tablet (100 mg total) by mouth once as needed for up to 1 dose for migraine (Take 1 tablet at onset. may repeat an additional tablet in 2 hours if needed (Do not exceed more than 2 tablet in 24 hrs)). (Patient not taking: Reported on 07/06/2024), Disp: 10 tablet, Rfl: 2   tirzepatide  (MOUNJARO ) 7.5 MG/0.5ML Pen, Inject 7.5 mg into the skin once a week. (Patient not taking: Reported on 07/06/2024), Disp: , Rfl:    topiramate  (TOPAMAX ) 25 MG tablet, Take 1 tablet (25 mg total) by mouth 2 (two) times daily., Disp: 60 tablet, Rfl: 3   triamcinolone ointment (KENALOG) 0.1 %, Apply 1 Application topically daily at 12 noon., Disp: , Rfl:    Turmeric, Curcuma [place], (TURMERIC ROOT) POWD, Take 15 mLs by mouth daily. Put in food, Disp: , Rfl:    Allergies  Allergen Reactions   Clindamycin/Lincomycin Other (See Comments), Hives, Palpitations, Rash and Swelling    Other Reaction(s): Racing heart beat   Gabapentin Hives, Nausea And Vomiting, Palpitations, Rash and Shortness Of Breath    Other Reaction(s): Unknown   Other Anaphylaxis    Allergic to Rynatan cough syrup.   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   T2DM (type 2 diabetes mellitus) (HCC)    TOA (tubo-ovarian abscess)      Past Surgical History:  Procedure Laterality Date   BREAST LUMPECTOMY Right 2024   CHOLECYSTECTOMY     DILATATION AND CURETTAGE/HYSTEROSCOPY WITH MINERVA     3 total  INCISION AND DRAINAGE ABSCESS  2021   buttocks   IR LUMBAR PUNCTURE  11/15/2023   RADIOLOGY WITH ANESTHESIA N/A 11/15/2023   Procedure: Lumbar Puncture;  Surgeon: Karalee Wilkie POUR, MD;  Location: [facility] OR;  Service: Radiology;  Laterality: N/A;   TONSILLECTOMY AND ADENOIDECTOMY      Family History  Problem Relation Age of Onset   Diabetes Mother    Hypertension Mother    Migraines Mother    Diabetes Father    Hypertension Father    Sleep apnea Father     Social History   Tobacco Use   Smoking status: Never    Passive exposure: Past   Smokeless tobacco: Never  Vaping Use   Vaping status: Never Used  Substance Use Topics   Alcohol use: Yes    Comment: occasional   Drug use: No    ROS   Objective:   Vitals: BP (!) 134/91 (BP Location: Right Wrist)   Pulse 94   Temp 98.9 F (37.2 C) (Oral)   Resp 20   SpO2 96%   Physical Exam Constitutional:      General: She is not in acute distress.    Appearance: Normal appearance. She is well-developed. She is not ill-appearing, toxic-appearing or diaphoretic.  HENT:     Head: Normocephalic and atraumatic.     Nose: Nose normal.     Mouth/Throat:     Mouth: Mucous membranes are moist.  Eyes:     General: No  scleral icterus.       Right eye: No discharge.        Left eye: No discharge.     Extraocular Movements: Extraocular movements intact.  Cardiovascular:     Rate and Rhythm: Normal rate.  Pulmonary:     Effort: Pulmonary effort is normal.  Musculoskeletal:     Comments: Full range of motion throughout.  Strength 5/5 for upper and lower extremities.  Patient ambulates without any assistance at expected pace.  No ecchymosis, swelling, lacerations or abrasions.  Patient does have paraspinal muscle tenderness along the mid thoracic to lumbar region of her back.  Also has tenderness about the left forearm.  Skin:    General: Skin is warm and dry.  Neurological:     General: No focal deficit present.     Mental Status: She is alert and oriented to person, place, and time.     Cranial Nerves: No cranial nerve deficit.     Motor: No weakness.     Coordination: Coordination normal.     Gait: Gait normal.  Psychiatric:        Mood and Affect: Mood normal.        Behavior: Behavior normal.    IM Toradol  30 mg administered in clinic.   Assessment and Plan :   PDMP not reviewed this encounter.  1. Acute midline low back pain without sciatica   2. Contusion of left forearm, initial encounter   3. Accidental fall, initial encounter    X-ray over-read was pending at time of discharge, recommended follow up with only abnormal results. Otherwise will not call for negative over-read. Patient was in agreement.  Will manage her musculoskeletal pain related to her fall, contusions with naproxen  and a muscle relaxant.  Follow-up with her chronic pain clinic.  Counseled patient on potential for adverse effects with medications prescribed/recommended today, ER and return-to-clinic precautions discussed, patient verbalized understanding.    Christopher Savannah, [place] 08/06/24 [phone]

## 2024-08-06 NOTE — ED Triage Notes (Signed)
 Pt states she fell in the shower last night-pain to LUE, mid/lower back pain-pt states she is taking oxycodone /last dose 4am-NAD-slow gait

## 2024-08-07 ENCOUNTER — Ambulatory Visit: Payer: Self-pay | Admitting: Internal Medicine

## 2024-08-07 MED ORDER — FOLIC ACID 1 MG PO TABS: 1.0000 mg | ORAL_TABLET | Freq: Every day | ORAL | 0 refills | Status: DC

## 2024-08-07 MED ORDER — METHOTREXATE SODIUM 2.5 MG PO TABS: 15.0000 mg | ORAL_TABLET | ORAL | 1 refills | Status: DC

## 2024-08-07 NOTE — Addendum Note (Signed)
 Addended by: JEANNETTA LONNI ORN on: 08/07/2024 04:33 PM   Modules accepted: Orders

## 2024-08-07 NOTE — Telephone Encounter (Signed)
 Patient called the office asking for lab results and mentioned a new medication start. Please advise.

## 2024-08-07 NOTE — Telephone Encounter (Signed)
 Now addressed in associated result note

## 2024-08-07 NOTE — Progress Notes (Signed)
 Sed rate remains high at 53 consistent with ongoing inflammation. Blood counts normal. Hepatitis screening was negative. I recommend to try starting on methotrexate as discussed in clinic which would be 6 tablets by mouth once weekly. She was low for folic acid  and should also start supplementing folic acid  1 mg daily. We should schedule a follow up in 1-2 months after starting this medication to recheck for symptoms and for lab monitoring.

## 2024-08-22 ENCOUNTER — Telehealth: Payer: Self-pay | Admitting: Internal Medicine

## 2024-08-22 DIAGNOSIS — M0600 Rheumatoid arthritis without rheumatoid factor, unspecified site: Secondary | ICD-10-CM

## 2024-08-22 NOTE — Telephone Encounter (Signed)
 Patient called stating she is experiencing fatigue, loss of appetite, and brain fog since starting her Methotrexate medication on 08/11/24. Patient states she can barely remember yesterday.   Patient is also experiencing burning and itching around her stomach area.  Patient requested a return call.

## 2024-08-22 NOTE — Telephone Encounter (Signed)
 Returned call to patient to advise, Dr. Jeannetta is currently out of the office. Advised patient he should be back in th office tomorrow. Advised we would forward this message and reach out once we have a response. Patient expressed understanding.

## 2024-08-25 NOTE — Telephone Encounter (Signed)
 Patient contacted the office again to inquire if she should continue the MTX due to the side effects or if there is something else you have in mind for her to try.

## 2024-08-26 ENCOUNTER — Ambulatory Visit (HOSPITAL_COMMUNITY)
Admission: EM | Admit: 2024-08-26 | Discharge: 2024-08-26 | Disposition: A | Discharge status: Home / Self Care | Attending: Physician Assistant | Admitting: Physician Assistant

## 2024-08-26 ENCOUNTER — Encounter (HOSPITAL_COMMUNITY): Payer: Self-pay | Admitting: Emergency Medicine

## 2024-08-26 DIAGNOSIS — B372 Candidiasis of skin and nail: Secondary | ICD-10-CM | POA: Diagnosis present

## 2024-08-26 DIAGNOSIS — R21 Rash and other nonspecific skin eruption: Secondary | ICD-10-CM | POA: Diagnosis not present

## 2024-08-26 LAB — COMPREHENSIVE METABOLIC PANEL WITH GFR
ALT: 17 U/L (ref 0–44)
AST: 15 U/L (ref 15–41)
Albumin: 3.2 g/dL — ABNORMAL LOW (ref 3.5–5.0)
Alkaline Phosphatase: 94 U/L (ref 38–126)
Anion gap: 11 (ref 5–15)
BUN: 5 mg/dL — ABNORMAL LOW (ref 6–20)
CO2: 23 mmol/L (ref 22–32)
Calcium: 8.7 mg/dL — ABNORMAL LOW (ref 8.9–10.3)
Chloride: 103 mmol/L (ref 98–111)
Creatinine, Ser: 0.79 mg/dL (ref 0.44–1.00)
GFR, Estimated: >60 mL/min (ref >60)
Glucose, Bld: 150 mg/dL — ABNORMAL HIGH (ref 70–99)
Potassium: 3.9 mmol/L (ref 3.5–5.1)
Sodium: 137 mmol/L (ref 135–145)
Total Bilirubin: 0.3 mg/dL (ref 0.0–1.2)
Total Protein: 7.1 g/dL (ref 6.5–8.1)

## 2024-08-26 LAB — CBC WITH DIFFERENTIAL/PLATELET
Abs Immature Granulocytes: 0.14 K/uL — ABNORMAL HIGH (ref 0.00–0.07)
Basophils Absolute: 0 K/uL (ref 0.0–0.1)
Basophils Relative: 0 %
Eosinophils Absolute: 0.2 K/uL (ref 0.0–0.5)
Eosinophils Relative: 2 %
HCT: 41.5 % (ref 36.0–46.0)
Hemoglobin: 12.9 g/dL (ref 12.0–15.0)
Immature Granulocytes: 1 %
Lymphocytes Relative: 25 %
Lymphs Abs: 2.6 K/uL (ref 0.7–4.0)
MCH: 25.6 pg — ABNORMAL LOW (ref 26.0–34.0)
MCHC: 31.1 g/dL (ref 30.0–36.0)
MCV: 82.5 fL (ref 80.0–100.0)
Monocytes Absolute: 0.6 K/uL (ref 0.1–1.0)
Monocytes Relative: 6 %
Neutro Abs: 7.1 K/uL (ref 1.7–7.7)
Neutrophils Relative %: 66 %
Platelets: 344 K/uL (ref 150–400)
RBC: 5.03 MIL/uL (ref 3.87–5.11)
RDW: 14.6 % (ref 11.5–15.5)
WBC: 10.6 K/uL — ABNORMAL HIGH (ref 4.0–10.5)
nRBC: 0 % (ref 0.0–0.2)

## 2024-08-26 LAB — C-REACTIVE PROTEIN: CRP: 6.2 mg/dL — ABNORMAL HIGH (ref <1.0)

## 2024-08-26 MED ORDER — FLUCONAZOLE 150 MG PO TABS: 150.0000 mg | ORAL_TABLET | ORAL | 0 refills | Status: AC

## 2024-08-26 MED ORDER — CLOTRIMAZOLE 1 % EX CREA: 1.0000 | TOPICAL_CREAM | Freq: Two times a day (BID) | CUTANEOUS | 0 refills | Status: AC

## 2024-08-26 NOTE — ED Provider Notes (Signed)
 [facility]    CSN: [id number] Arrival date & time: 08/26/24  1608      History   Chief Complaint Chief Complaint  Patient presents with   Rash    HPI Jade Lopez is a 28 y.o. female.   Patient presents today with a several week history of rash involving her inguinal region.  She reports that her symptoms began a few days after she started methotrexate for lupus.  She has not had any oral lesions and denies any GI side effects including nausea, vomiting, diarrhea.  She reports that the rash has spread to involve bilateral inguinal creases as well as under her pannus.  She has been applying coconut oil, triamcinolone, trying to keep the area clean and dry but this has not provided any relief of symptoms.  She denies any oral or vaginal lesions.  She is eating and drinking normally.  She does report feeling generalized fatigue and bodyaches but states she is unsure if this is related to the autoimmune condition or medication.  She denies any additional changes to personal hygiene products or medications.  Denies any recent antibiotics.    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   Super obesity 10/28/2023   Chronic post-traumatic headache, not intractable 10/28/2023   Hypersomnia with sleep apnea 10/28/2023   Visual changes 10/28/2023   Abnormal laboratory test result 07/29/2023   Traumatic brain injury (HCC) 07/14/2023   History of nausea and vomiting 05/19/2023   Fibromyalgia 04/11/2023   Abscess of right breast 04/03/2023   Abdominal wall pain 03/01/2023    Sleep related headaches 02/24/2023   OSA on CPAP 02/24/2023   Sleeps in sitting position due to orthopnea 02/24/2023   Menorrhagia 01/28/2023   Healthcare maintenance 01/28/2023   Chest pain 01/28/2023   Back pain 01/28/2023   Type 2 diabetes mellitus (HCC) 11/28/2022   History of systemic lupus erythematosus (HCC) 11/28/2022   Class 3 obesity (HCC) 11/28/2022   Endometriosis 11/28/2022   Anxiety 11/28/2022   Vitamin D  deficiency 09/13/2012   Asthma 08/22/2012    Past Surgical History:  Procedure Laterality Date   BREAST LUMPECTOMY Right 2024   CHOLECYSTECTOMY     DILATATION AND CURETTAGE/HYSTEROSCOPY WITH MINERVA     3 total   INCISION AND DRAINAGE ABSCESS  2021   buttocks   IR LUMBAR PUNCTURE  11/15/2023   RADIOLOGY WITH ANESTHESIA N/A 11/15/2023   Procedure: Lumbar Puncture;  Surgeon: Karalee Wilkie POUR, MD;  Location: [facility] OR;  Service: Radiology;  Laterality: N/A;   TONSILLECTOMY AND ADENOIDECTOMY      OB History     Gravida  0   Para  0   Term  0   Preterm  0   AB  0   Living  0      SAB  0   IAB  0   Ectopic  0   Multiple  0   Live Births  0  Home Medications    Prior to Admission medications   Medication Sig Start Date End Date Taking? Authorizing Provider  clotrimazole (LOTRIMIN) 1 % cream Apply 1 Application topically 2 (two) times daily. 08/26/24  Yes Loretta Kluender K, PA-C  fluconazole  (DIFLUCAN ) 150 MG tablet Take 1 tablet (150 mg total) by mouth once a week for 2 doses. 08/26/24 09/03/24 Yes Kathlynn Swofford K, PA-C  Accu-Chek Softclix Lancets lancets daily. 12/15/22   [provider]  albuterol  (PROVENTIL ) (2.5 MG/3ML) 0.083% nebulizer solution Take 3 mLs (2.5 mg total) by nebulization every 6 (six) hours as needed for wheezing or shortness of breath. 12/08/23   Mayer, Jodi R, NP  albuterol  (VENTOLIN  HFA) 108 (90 Base) MCG/ACT inhaler Inhale 1-2 puffs into the lungs every 6 (six) hours as needed for wheezing or shortness of  breath. 05/10/23   Odell Celinda Balo, MD  BIOTIN PO Take 2 capsules by mouth daily. Hair, skin nails Patient not taking: Reported on 08/26/2024    [provider]  Blood Glucose Monitoring Suppl (MM EASY TOUCH GLUCOSE METER) w/Device KIT Please check your blood sugar daily, in the morning. 12/08/22   Franchot Novel, MD  Blood Glucose Monitoring Suppl DEVI 1 each by Does not apply route 3 (three) times daily. May dispense any manufacturer covered by patient's insurance. 05/10/23   Odell Celinda Balo, MD  budesonide  (PULMICORT ) 1 MG/2ML nebulizer solution SMARTSIG:2 Milliliter(s) Twice Daily 06/14/24   [provider]  budesonide -formoterol  (SYMBICORT ) 80-4.5 MCG/ACT inhaler Inhale 2 puffs into the lungs 2 (two) times daily as needed. 05/19/23   Amoako, Prince, MD  cetirizine (ZYRTEC) 10 MG tablet Take 10 mg by mouth daily. Patient not taking: Reported on 08/26/2024 04/06/24   [provider]  cyclobenzaprine  (FLEXERIL ) 5 MG tablet Take 1 tablet (5 mg total) by mouth at bedtime as needed. Patient not taking: Reported on 08/26/2024 08/06/24   Christopher Savannah, PA-C  folic acid  (FOLVITE ) 1 MG tablet Take 1 tablet (1 mg total) by mouth daily. 08/07/24   Jeannetta Lonni ORN, MD  glipiZIDE  2.5 MG TABS Take 2.5 mg by mouth 2 (two) times daily before a meal. Patient not taking: Reported on 07/06/2024 06/01/24   Christopher Savannah, PA-C  Glucose Blood (BLOOD GLUCOSE TEST STRIPS) STRP 1 each by Does not apply route 3 (three) times daily. Use as directed to check blood sugar. May dispense any manufacturer covered by patient's insurance and fits patient's device. 05/10/23   Odell Celinda Balo, MD  ipratropium-albuterol  (DUONEB) 0.5-2.5 (3) MG/3ML SOLN SMARTSIG:3 Milliliter(s) Every 6-8 Hours PRN    [provider]  Lancet Device MISC 1 each by Does not apply route 3 (three) times daily. May dispense any manufacturer covered by patient's insurance. 05/10/23   Odell Celinda Balo, MD  Lancets  MISC 1 each by Does not apply route 3 (three) times daily. Use as directed to check blood sugar. May dispense any manufacturer covered by patient's insurance and fits patient's device. 05/10/23   Odell Celinda Balo, MD  lidocaine  (LIDODERM ) 5 % Place 1 patch onto the skin daily. Remove & Discard patch within 12 hours or as directed by MD Patient not taking: Reported on 07/06/2024 01/25/24   Kommor, Lum, MD  medroxyPROGESTERone  (DEPO-PROVERA ) 150 MG/ML injection Inject 150 mg into the muscle every 3 (three) months.    [provider]  methotrexate (RHEUMATREX) 2.5 MG tablet Take 6 tablets (15 mg total) by mouth once a week. Caution:Chemotherapy. Protect from light. 08/07/24   Rice, Lonni ORN,  MD  MOUNJARO  10 MG/0.5ML Pen SMARTSIG:10 Milligram(s) SUB-Q Once a Week 04/20/24   [provider]  MOUNJARO  7.5 MG/0.5ML Pen Inject into the skin. Patient not taking: Reported on 07/06/2024 09/30/23   [provider]  Multiple Vitamins-Minerals (ZINC PO) Take 1 tablet by mouth daily. Patient not taking: Reported on 08/26/2024    [provider]  naloxone  (NARCAN ) nasal spray 4 mg/0.1 mL SMARTSIG:Both Nares 03/31/24   [provider]  naproxen  (NAPROSYN ) 375 MG tablet Take 1 tablet (375 mg total) by mouth 2 (two) times daily with a meal. Patient not taking: Reported on 08/26/2024 08/06/24   Christopher Savannah, PA-C  norethindrone  (AYGESTIN ) 5 MG tablet Take 10 mg by mouth at bedtime. 12/11/22   [provider]  nystatin (MYCOSTATIN) 100000 UNIT/ML suspension Take 5 mLs by mouth 4 (four) times daily. Patient not taking: Reported on 07/06/2024 04/06/24   [provider]  omeprazole (PRILOSEC) 40 MG capsule Take 40 mg by mouth daily. Patient not taking: Reported on 08/26/2024 05/08/24   [provider]  ondansetron  (ZOFRAN -ODT) 8 MG disintegrating tablet Take 1 tablet (8 mg total) by mouth every 8 (eight) hours as needed for nausea or vomiting. 05/17/23    Mayer, Jodi R, NP  predniSONE  (DELTASONE ) 20 MG tablet Take 20 mg by mouth 2 (two) times daily. Patient not taking: Reported on 08/26/2024 06/27/24   [provider]  pregabalin  (LYRICA ) 75 MG capsule Take 1 capsule (75 mg total) by mouth 3 (three) times daily. 07/06/24   Jeannetta Lonni ORN, MD  promethazine -dextromethorphan (PROMETHAZINE -DM) 6.25-15 MG/5ML syrup Take 5 mLs by mouth 4 (four) times daily as needed for cough. Patient not taking: Reported on 07/06/2024 12/08/23   Mayer, Jodi R, NP  SUMAtriptan  (IMITREX ) 100 MG tablet Take 1 tablet (100 mg total) by mouth once as needed for up to 1 dose for migraine (Take 1 tablet at onset. may repeat an additional tablet in 2 hours if needed (Do not exceed more than 2 tablet in 24 hrs)). Patient not taking: Reported on 07/06/2024 02/28/24   Dohmeier, Dedra, MD  tirzepatide  (MOUNJARO ) 7.5 MG/0.5ML Pen Inject 7.5 mg into the skin once a week. Patient not taking: Reported on 07/06/2024 01/31/24   [provider]  topiramate  (TOPAMAX ) 25 MG tablet Take 1 tablet (25 mg total) by mouth 2 (two) times daily. Patient not taking: Reported on 08/26/2024 11/08/23   Dohmeier, Dedra, MD  triamcinolone ointment (KENALOG) 0.1 % Apply 1 Application topically daily at 12 noon.    [provider]  Turmeric, Curcuma [place]a, (TURMERIC ROOT) POWD Take 15 mLs by mouth daily. Put in food    [provider]    Family History Family History  Problem Relation Age of Onset   Diabetes Mother    Hypertension Mother    Migraines Mother    Diabetes Father    Hypertension Father    Sleep apnea Father     Social History Social History   Tobacco Use   Smoking status: Never    Passive exposure: Past   Smokeless tobacco: Never  Vaping Use   Vaping status: Never Used  Substance Use Topics   Alcohol use: Yes    Comment: occasional   Drug use: No     Allergies   Clindamycin/lincomycin, Gabapentin, Other, Pregabalin , Cephalexin ,  Gadolinium derivatives, Iodinated contrast media, Meloxicam, Prednisone , and Clindamycin   Review of Systems Review of Systems  Constitutional:  Positive for activity change and fatigue. Negative for appetite change  and fever.  Respiratory:  Negative for cough and shortness of breath.   Cardiovascular:  Negative for chest pain.  Gastrointestinal:  Negative for abdominal pain, diarrhea, nausea and vomiting.  Musculoskeletal:  Positive for arthralgias (widespread arthralgia). Negative for myalgias.  Skin:  Positive for rash.  Neurological:  Negative for dizziness, light-headedness and headaches.     Physical Exam Triage Vital Signs ED Triage Vitals  Encounter Vitals Group     BP 08/26/24 1714 120/73     Girls Systolic BP Percentile --      Girls Diastolic BP Percentile --      Boys Systolic BP Percentile --      Boys Diastolic BP Percentile --      Pulse Rate 08/26/24 1714 (!) 107     Resp 08/26/24 1714 20     Temp 08/26/24 1714 98.6 F (37 C)     Temp Source 08/26/24 1714 Oral     SpO2 08/26/24 1714 96 %     Weight 08/26/24 1716 (!) 401 lb (181.9 kg)     Height 08/26/24 1716 5' 4.5 (1.638 m)     Head Circumference --      Peak Flow --      Pain Score 08/26/24 1715 8     Pain Loc --      Pain Education --      Exclude from Growth Chart --    No data found.  Updated Vital Signs BP 120/73 (BP Location: Right Arm)   Pulse (!) 105   Temp 98.6 F (37 C) (Oral)   Resp 20   Ht 5' 4.5 (1.638 m)   Wt (!) 401 lb (181.9 kg)   LMP  (Exact Date)   SpO2 97%   BMI 67.77 kg/m   Visual Acuity Right Eye Distance:   Left Eye Distance:   Bilateral Distance:    Right Eye Near:   Left Eye Near:    Bilateral Near:     Physical Exam Vitals reviewed.  Constitutional:      General: She is awake. She is not in acute distress.    Appearance: Normal appearance. She is well-developed. She is not ill-appearing.     Comments: Very pleasant female appears stated age in no acute  distress sitting comfortably in exam room  HENT:     Head: Normocephalic and atraumatic.     Mouth/Throat:     Palate: No lesions.     Pharynx: Uvula midline. No oropharyngeal exudate or posterior oropharyngeal erythema.  Cardiovascular:     Rate and Rhythm: Regular rhythm. Tachycardia present.     Heart sounds: Normal heart sounds, S1 normal and S2 normal. No murmur heard. Pulmonary:     Effort: Pulmonary effort is normal.     Breath sounds: Normal breath sounds. No wheezing, rhonchi or rales.     Comments: Clear to auscultation bilaterally Abdominal:     Palpations: Abdomen is soft.     Tenderness: There is no abdominal tenderness.  Skin:    Findings: Rash present. Rash is macular, papular and scaling.     Comments: Erythematous maculopapular rash with associated scaling noted bilateral inguinal crease and inferior to pannus with no involvement of labia.  Negative Nikolsky sign.   Psychiatric:        Behavior: Behavior is cooperative.      UC Treatments / Results  Labs (all labs ordered are listed, but only abnormal results are displayed) Labs Reviewed  CBC WITH DIFFERENTIAL/PLATELET  COMPREHENSIVE METABOLIC  PANEL WITH GFR  C-REACTIVE PROTEIN    EKG   Radiology No results found.  Procedures Procedures (including critical care time)  Medications Ordered in UC Medications - No data to display  Initial Impression / Assessment and Plan / UC Course  I have reviewed the triage vital signs and the nursing notes.  Pertinent labs & imaging results that were available during my care of the patient were reviewed by me and considered in my medical decision making (see chart for details).     Patient is mildly tachycardic but is otherwise well-appearing, afebrile, nontoxic, nontachycardic.  She reports that she is generally tachycardic and this heart rate is around her baseline.  Low suspicion for erythema multiforme given clinical presentation I suspect symptoms are related  to intertrigo.  She does not have any oral or other mucosal lesions so I am not concerned for SJS at this time.  We did discuss that it is possible this is a reaction to her medication and recommended that she not take an additional dose of methotrexate until she is evaluated by rheumatology.  Basic blood work including CBC, CMP, CRP were obtained and are pending.  We will contact her if these are abnormal.  We will treat for intertrigo which I believe is the most likely diagnosis and she was started on clotrimazole twice daily topically as well as 2 doses of fluconazole  weekly..  She is to keep the area clean and dry and use hypoallergenic soaps and detergents.  We discussed that she should have a very low threshold for going to the emergency room if her symptoms are not improving quickly including if she has lesions involving her vagina or mouth, rapid spread of rash, fever, nausea/vomiting, weakness.  Strict return precautions given.  Excuse note provided.  Final Clinical Impressions(s) / UC Diagnoses   Final diagnoses:  Candidal intertrigo  Rash of groin     Discharge Instructions      I believe that your symptoms are more related to yeast but it is possible it is related to the methotrexate.  Please do not restart the methotrexate until you talk to your rheumatologist and have them evaluate you.  Take Diflucan  1 dose today and a second dose in a week.  Apply clotrimazole twice a day.  Keep the area clean and dry.  If you have any lesions involving the vagina, your mouth, rapid spread of rash you need to go to the emergency room.  If anything changes or worsens and you have fever, nausea, vomiting, difficulty swallowing you need to go to the ER.     ED Prescriptions     Medication Sig Dispense Auth. Provider   fluconazole  (DIFLUCAN ) 150 MG tablet Take 1 tablet (150 mg total) by mouth once a week for 2 doses. 2 tablet Capria Cartaya K, PA-C   clotrimazole (LOTRIMIN) 1 % cream Apply 1  Application topically 2 (two) times daily. 113 g Olliver Boyadjian K, PA-C      PDMP not reviewed this encounter.   Sherrell Rocky POUR, PA-C 08/26/24 [id number]

## 2024-08-26 NOTE — ED Triage Notes (Addendum)
 PT presents with c/o rash under abd and on upper thighs since 10/5.  Pt st's she started taking Methotrexate 2.5mg  on 10/2 and this is when the symptoms started  St's she is fatigued and just doesn't feel good   PT also c/o generalized body aches

## 2024-08-26 NOTE — Discharge Instructions (Signed)
 I believe that your symptoms are more related to yeast but it is possible it is related to the methotrexate.  Please do not restart the methotrexate until you talk to your rheumatologist and have them evaluate you.  Take Diflucan  1 dose today and a second dose in a week.  Apply clotrimazole twice a day.  Keep the area clean and dry.  If you have any lesions involving the vagina, your mouth, rapid spread of rash you need to go to the emergency room.  If anything changes or worsens and you have fever, nausea, vomiting, difficulty swallowing you need to go to the ER.

## 2024-08-27 ENCOUNTER — Ambulatory Visit (HOSPITAL_COMMUNITY): Payer: Self-pay | Admitting: Physician Assistant

## 2024-08-30 ENCOUNTER — Encounter: Payer: Self-pay | Admitting: Internal Medicine

## 2024-08-30 ENCOUNTER — Other Ambulatory Visit: Payer: Self-pay | Admitting: Internal Medicine

## 2024-08-30 DIAGNOSIS — M0600 Rheumatoid arthritis without rheumatoid factor, unspecified site: Secondary | ICD-10-CM

## 2024-08-30 MED ORDER — LEFLUNOMIDE 10 MG PO TABS: 10.0000 mg | ORAL_TABLET | Freq: Every day | ORAL | 1 refills | Status: DC

## 2024-08-30 NOTE — Telephone Encounter (Signed)
 I spoke with Ms. Faivre about her side effects she has noticed this consistently with each dose of methotrexate so far.  Her recent lab tests from last week did show an improvement in the CRP with no significant abnormality in blood count or metabolic panel.  However for better drug tolerance I recommended we try switching her to 10 mg leflunomide to see if this causes less problems.  If she does have significant GI intolerance with this I think we will need to follow-up and discuss potentially trying an injectable biologic treatment option as a next step.

## 2024-09-01 ENCOUNTER — Other Ambulatory Visit (HOSPITAL_COMMUNITY): Payer: Self-pay | Admitting: Internal Medicine

## 2024-09-01 ENCOUNTER — Ambulatory Visit (HOSPITAL_COMMUNITY)
Admission: RE | Admit: 2024-09-01 | Discharge: 2024-09-01 | Disposition: A | Discharge status: Home / Self Care | Source: Ambulatory Visit | Attending: Internal Medicine | Admitting: Internal Medicine

## 2024-09-01 DIAGNOSIS — I361 Nonrheumatic tricuspid (valve) insufficiency: Secondary | ICD-10-CM | POA: Diagnosis not present

## 2024-09-01 DIAGNOSIS — R0602 Shortness of breath: Secondary | ICD-10-CM | POA: Diagnosis present

## 2024-09-01 MED ORDER — GADOBUTROL 1 MMOL/ML IV SOLN
15.0000 mL | Freq: Once | INTRAVENOUS | Status: AC | PRN
  Administered 2024-09-01: 15 mL via INTRAVENOUS

## 2024-09-25 ENCOUNTER — Encounter: Payer: Self-pay | Admitting: Internal Medicine

## 2024-09-25 DIAGNOSIS — M0600 Rheumatoid arthritis without rheumatoid factor, unspecified site: Secondary | ICD-10-CM

## 2024-09-25 MED ORDER — PREDNISONE 10 MG PO TABS: ORAL_TABLET | ORAL | 0 refills | Status: DC

## 2024-09-26 ENCOUNTER — Ambulatory Visit (HOSPITAL_COMMUNITY)
Admission: EM | Admit: 2024-09-26 | Discharge: 2024-09-26 | Disposition: A | Discharge status: Home / Self Care | Attending: Family Medicine | Admitting: Family Medicine

## 2024-09-26 ENCOUNTER — Encounter (HOSPITAL_COMMUNITY): Payer: Self-pay | Admitting: *Deleted

## 2024-09-26 DIAGNOSIS — J069 Acute upper respiratory infection, unspecified: Secondary | ICD-10-CM

## 2024-09-26 DIAGNOSIS — J4541 Moderate persistent asthma with (acute) exacerbation: Secondary | ICD-10-CM

## 2024-09-26 DIAGNOSIS — R42 Dizziness and giddiness: Secondary | ICD-10-CM | POA: Diagnosis not present

## 2024-09-26 DIAGNOSIS — M329 Systemic lupus erythematosus, unspecified: Secondary | ICD-10-CM

## 2024-09-26 LAB — POC SOFIA SARS ANTIGEN FIA: SARS Coronavirus 2 Ag: NEGATIVE

## 2024-09-26 LAB — POCT INFLUENZA A/B
Influenza A, POC: NEGATIVE
Influenza B, POC: NEGATIVE

## 2024-09-26 LAB — GLUCOSE, POCT (MANUAL RESULT ENTRY): POCT Glucose (KUC): 124 mg/dL — AB (ref 70–99)

## 2024-09-26 MED ORDER — ONDANSETRON 4 MG PO TBDP: 4.0000 mg | ORAL_TABLET | Freq: Three times a day (TID) | ORAL | 0 refills | Status: AC | PRN

## 2024-09-26 MED ORDER — ONDANSETRON 4 MG PO TBDP
4.0000 mg | ORAL_TABLET | Freq: Once | ORAL | Status: AC
  Administered 2024-09-26: 4 mg via ORAL

## 2024-09-26 MED ORDER — ONDANSETRON 4 MG PO TBDP
ORAL_TABLET | ORAL | Status: AC
Start: 2024-09-26 — End: 2024-09-26
  Filled 2024-09-26: qty 1

## 2024-09-26 NOTE — ED Provider Notes (Addendum)
 [facility]    CSN: [id number] Arrival date & time: 09/26/24  1537      History   Chief Complaint Chief Complaint  Patient presents with   Headache   Dizziness   Diarrhea   lupus flare    HPI Jade Lopez is a 28 y.o. female.    Headache Associated symptoms: diarrhea and dizziness   Dizziness Associated symptoms: diarrhea and headaches   Diarrhea Associated symptoms: headaches    Here for headache and dizziness and cough and congestion and rhinorrhea and nausea and vomiting and diarrhea.  Symptoms all began about November 14.  She has felt hot and cold but has not measured a fever.  She felt that this might be a lupus flareup, so she has called her rheumatologist.  They sent in prednisone  for her to take for the flare, but she does not want to take it as it makes her feel bad and it raises her sugars.  I have asked her today if steroid injections do the same thing as far as making her feel bad and she states they do.  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   Super obesity 10/28/2023   Chronic post-traumatic headache, not intractable 10/28/2023   Hypersomnia with sleep apnea 10/28/2023   Visual changes 10/28/2023   Abnormal laboratory test result 07/29/2023   Traumatic brain injury (HCC) 07/14/2023   History of nausea and vomiting 05/19/2023   Fibromyalgia 04/11/2023    Abscess of right breast 04/03/2023   Abdominal wall pain 03/01/2023   Sleep related headaches 02/24/2023   OSA on CPAP 02/24/2023   Sleeps in sitting position due to orthopnea 02/24/2023   Menorrhagia 01/28/2023   Healthcare maintenance 01/28/2023   Chest pain 01/28/2023   Back pain 01/28/2023   Type 2 diabetes mellitus (HCC) 11/28/2022   History of systemic lupus erythematosus (HCC) 11/28/2022   Class 3 obesity (HCC) 11/28/2022   Endometriosis 11/28/2022   Anxiety 11/28/2022   Vitamin D  deficiency 09/13/2012   Asthma 08/22/2012    Past Surgical History:  Procedure Laterality Date   BREAST LUMPECTOMY Right 2024   CHOLECYSTECTOMY     DILATATION AND CURETTAGE/HYSTEROSCOPY WITH MINERVA     3 total   INCISION AND DRAINAGE ABSCESS  2021   buttocks   IR LUMBAR PUNCTURE  11/15/2023   RADIOLOGY WITH ANESTHESIA N/A 11/15/2023   Procedure: Lumbar Puncture;  Surgeon: Karalee Wilkie POUR, MD;  Location: [facility] OR;  Service: Radiology;  Laterality: N/A;   TONSILLECTOMY AND ADENOIDECTOMY      OB History     Gravida  0   Para  0   Term  0   Preterm  0   AB  0   Living  0      SAB  0   IAB  0   Ectopic  0   Multiple  0   Live Births  0            Home Medications    Prior to Admission medications   Medication Sig Start Date End Date Taking? Authorizing Provider  budesonide -formoterol  (SYMBICORT ) 80-4.5 MCG/ACT inhaler Inhale 2 puffs into the lungs 2 (two) times daily as needed. 05/19/23  Yes Amoako, Prince, MD  leflunomide (ARAVA) 10 MG tablet Take 1 tablet (10 mg total) by mouth daily. 08/30/24  Yes Rice, Lonni ORN, MD  medroxyPROGESTERone  (DEPO-PROVERA ) 150 MG/ML injection Inject 150 mg into the muscle every 3 (three) months.   Yes [provider]  MOUNJARO  10 MG/0.5ML Pen SMARTSIG:10 Milligram(s) SUB-Q Once a Week 04/20/24  Yes [provider]  ondansetron  (ZOFRAN -ODT) 4 MG disintegrating tablet Take 1 tablet (4 mg total) by mouth every 8 (eight)  hours as needed for nausea or vomiting. 09/26/24  Yes Vonna Sharlet POUR, MD  oxyCODONE -acetaminophen  (PERCOCET) 10-325 MG tablet Take 1 tablet by mouth 3 (three) times daily as needed. 09/07/24  Yes [provider]  pregabalin  (LYRICA ) 75 MG capsule Take 1 capsule (75 mg total) by mouth 3 (three) times daily. 07/06/24  Yes Rice, Lonni ORN, MD  Accu-Chek Softclix Lancets lancets daily. 12/15/22   [provider]  albuterol  (PROVENTIL ) (2.5 MG/3ML) 0.083% nebulizer solution Take 3 mLs (2.5 mg total) by nebulization every 6 (six) hours as needed for wheezing or shortness of breath. 12/08/23   Mayer, Jodi R, NP  albuterol  (VENTOLIN  HFA) 108 (90 Base) MCG/ACT inhaler Inhale 1-2 puffs into the lungs every 6 (six) hours as needed for wheezing or shortness of breath. 05/10/23   Odell Celinda Balo, MD  BIOTIN PO Take 2 capsules by mouth daily. Hair, skin nails Patient not taking: Reported on 08/26/2024    [provider]  Blood Glucose Monitoring Suppl (MM EASY TOUCH GLUCOSE METER) w/Device KIT Please check your blood sugar daily, in the morning. 12/08/22   Franchot Novel, MD  Blood Glucose Monitoring Suppl DEVI 1 each by Does not apply route 3 (three) times daily. May dispense any manufacturer covered by patient's insurance. 05/10/23   Odell Celinda Balo, MD  budesonide  (PULMICORT ) 1 MG/2ML nebulizer solution SMARTSIG:2 Milliliter(s) Twice Daily 06/14/24   [provider]  cetirizine (ZYRTEC) 10 MG tablet Take 10 mg by mouth daily. Patient not taking: Reported on 08/26/2024 04/06/24   [provider]  clotrimazole (LOTRIMIN) 1 % cream Apply 1 Application topically 2 (two) times daily. 08/26/24   Raspet, Erin K, PA-C  cyclobenzaprine  (FLEXERIL ) 5 MG tablet Take 1 tablet (5 mg total) by mouth at bedtime as needed. Patient not taking: Reported on 08/26/2024 08/06/24   Christopher Savannah, PA-C  folic acid  (FOLVITE ) 1 MG tablet Take 1 tablet (1 mg total) by mouth daily.  08/07/24   Jeannetta Lonni ORN, MD  glipiZIDE  2.5 MG TABS Take 2.5 mg by mouth 2 (two) times daily before a meal. Patient not taking: No sig reported 06/01/24   Christopher Savannah, PA-C  Glucose Blood (BLOOD GLUCOSE TEST STRIPS) STRP 1 each by Does not apply route 3 (three) times daily. Use as directed to check blood sugar. May dispense any manufacturer covered by patient's insurance and fits patient's device. 05/10/23   Odell Celinda Balo, MD  ipratropium-albuterol  (DUONEB) 0.5-2.5 (3) MG/3ML SOLN SMARTSIG:3 Milliliter(s) Every 6-8 Hours PRN    [provider]  Lancet Device MISC 1 each by Does not apply route 3 (three)  times daily. May dispense any manufacturer covered by patient's insurance. 05/10/23   Odell Celinda Balo, MD  Lancets MISC 1 each by Does not apply route 3 (three) times daily. Use as directed to check blood sugar. May dispense any manufacturer covered by patient's insurance and fits patient's device. 05/10/23   Odell Celinda Balo, MD  lidocaine  (LIDODERM ) 5 % Place 1 patch onto the skin daily. Remove & Discard patch within 12 hours or as directed by MD Patient not taking: No sig reported 01/25/24   Kommor, Madison, MD  Multiple Vitamins-Minerals (ZINC PO) Take 1 tablet by mouth daily. Patient not taking: Reported on 08/26/2024    [provider]  naloxone  (NARCAN ) nasal spray 4 mg/0.1 mL SMARTSIG:Both Nares 03/31/24   [provider]  norethindrone  (AYGESTIN ) 5 MG tablet Take 10 mg by mouth at bedtime. 12/11/22   [provider]  nystatin (MYCOSTATIN) 100000 UNIT/ML suspension Take 5 mLs by mouth 4 (four) times daily. Patient not taking: No sig reported 04/06/24   [provider]  omeprazole (PRILOSEC) 40 MG capsule Take 40 mg by mouth daily. Patient not taking: Reported on 08/26/2024 05/08/24   [provider]  predniSONE  (DELTASONE ) 10 MG tablet Take 4 tablets (40 mg total) by mouth daily with breakfast for 3 days, THEN 3 tablets (30 mg  total) daily with breakfast for 3 days, THEN 2 tablets (20 mg total) daily with breakfast for 3 days, THEN 1 tablet (10 mg total) daily with breakfast for 3 days. 09/25/24 10/07/24  Jeannetta Lonni ORN, MD  SUMAtriptan  (IMITREX ) 100 MG tablet Take 1 tablet (100 mg total) by mouth once as needed for up to 1 dose for migraine (Take 1 tablet at onset. may repeat an additional tablet in 2 hours if needed (Do not exceed more than 2 tablet in 24 hrs)). Patient not taking: No sig reported 02/28/24   Dohmeier, Dedra, MD  tirzepatide  (MOUNJARO ) 7.5 MG/0.5ML Pen Inject 7.5 mg into the skin once a week. Patient not taking: No sig reported 01/31/24   [provider]  topiramate  (TOPAMAX ) 25 MG tablet Take 1 tablet (25 mg total) by mouth 2 (two) times daily. Patient not taking: Reported on 08/26/2024 11/08/23   Dohmeier, Dedra, MD  triamcinolone ointment (KENALOG) 0.1 % Apply 1 Application topically daily at 12 noon.    [provider]  Turmeric, Curcuma [place], (TURMERIC ROOT) POWD Take 15 mLs by mouth daily. Put in food    [provider]    Family History Family History  Problem Relation Age of Onset   Diabetes Mother    Hypertension Mother    Migraines Mother    Diabetes Father    Hypertension Father    Sleep apnea Father     Social History Social History   Tobacco Use   Smoking status: Never    Passive exposure: Past   Smokeless tobacco: Never  Vaping Use   Vaping status: Never Used  Substance Use Topics   Alcohol use: Yes    Comment: occasional   Drug use: No     Allergies   Clindamycin/lincomycin, Gabapentin, Other, Pregabalin , Cephalexin , Gadolinium derivatives, Iodinated contrast media, Meloxicam, Prednisone , and Clindamycin   Review of Systems Review of Systems  Gastrointestinal:  Positive for diarrhea.  Neurological:  Positive for dizziness and headaches.     Physical Exam Triage Vital Signs ED Triage Vitals  Encounter Vitals Group     BP  09/26/24 1709 127/88     Girls Systolic BP  Percentile --      Girls Diastolic BP Percentile --      Boys Systolic BP Percentile --      Boys Diastolic BP Percentile --      Pulse Rate 09/26/24 1709 (!) 103     Resp 09/26/24 1709 18     Temp 09/26/24 1709 98.3 F (36.8 C)     Temp Source 09/26/24 1709 Oral     SpO2 09/26/24 1709 97 %     Weight --      Height --      Head Circumference --      Peak Flow --      Pain Score 09/26/24 1705 9     Pain Loc --      Pain Education --      Exclude from Growth Chart --    No data found.  Updated Vital Signs BP 127/88 (BP Location: Right Arm)   Pulse (!) 103   Temp 98.3 F (36.8 C) (Oral)   Resp 18   LMP  (LMP Unknown) Comment: none due to depo  SpO2 97%   Visual Acuity Right Eye Distance:   Left Eye Distance:   Bilateral Distance:    Right Eye Near:   Left Eye Near:    Bilateral Near:     Physical Exam Vitals reviewed.  Constitutional:      General: She is not in acute distress.    Appearance: She is not ill-appearing, toxic-appearing or diaphoretic.  HENT:     Right Ear: Tympanic membrane and ear canal normal.     Left Ear: Tympanic membrane and ear canal normal.     Nose: Nose normal.     Mouth/Throat:     Mouth: Mucous membranes are moist.     Pharynx: No oropharyngeal exudate or posterior oropharyngeal erythema.  Eyes:     Extraocular Movements: Extraocular movements intact.     Conjunctiva/sclera: Conjunctivae normal.     Pupils: Pupils are equal, round, and reactive to light.  Cardiovascular:     Rate and Rhythm: Normal rate and regular rhythm.     Heart sounds: No murmur heard. Pulmonary:     Effort: No respiratory distress.     Breath sounds: No stridor. No rhonchi or rales.     Comments: She has some expiratory wheezes on exam. Musculoskeletal:     Cervical back: Neck supple.  Lymphadenopathy:     Cervical: No cervical adenopathy.  Skin:    Capillary Refill: Capillary refill takes less than 2 seconds.      Coloration: Skin is not jaundiced or pale.  Neurological:     General: No focal deficit present.     Mental Status: She is alert and oriented to person, place, and time.  Psychiatric:        Behavior: Behavior normal.      UC Treatments / Results  Labs (all labs ordered are listed, but only abnormal results are displayed) Labs Reviewed  GLUCOSE, POCT (MANUAL RESULT ENTRY) - Abnormal; Notable for the following components:      Result Value   POCT Glucose ([facility]) 124 (*)    All other components within normal limits  POC SOFIA SARS ANTIGEN FIA  POCT INFLUENZA A/B    EKG   Radiology No results found.  Procedures Procedures (including critical care time)  Medications Ordered in UC Medications  ondansetron  (ZOFRAN -ODT) disintegrating tablet 4 mg (4 mg Oral Given 09/26/24 1807)    Initial Impression / Assessment and  Plan / UC Course  I have reviewed the triage vital signs and the nursing notes.  Pertinent labs & imaging results that were available during my care of the patient were reviewed by me and considered in my medical decision making (see chart for details).     Testing for flu and COVID is negative.  1 dose of Zofran  is given here  Glucose is 124.  Zofran  is sent to the pharmacy for her symptoms.  I have recommended Tylenol  as needed for pain or fever.  I discussed with her that I am concerned that she has a viral illness instead of a lupus flare going on.  Also if she is not going to be able to take prednisone  there is nothing else I can offer her in urgent care setting for the lupus flare.  I would like her to contact her rheumatologist about that.  Also she may be having some asthma exacerbation.  Again, I do not have any further prescriptions for her to help with that.  She already has an end date albuterol  inhaler and uses Symbicort  regularly.  To the emergency room if she is not keeping fluids down, or if she is feeling worse in any way. Final  Clinical Impressions(s) / UC Diagnoses   Final diagnoses:  Viral URI  Dizziness  Moderate persistent asthma with acute exacerbation  Systemic lupus erythematosus, unspecified SLE type, unspecified organ involvement status [facility])     Discharge Instructions      Testing for flu and COVID was negative  Your sugar was 124  Ondansetron  dissolved in the mouth every 8 hours as needed for nausea or vomiting. Clear liquids(water, gatorade/pedialyte, ginger ale/sprite, chicken broth/soup) and bland things(crackers/toast, rice, potato, bananas) to eat. Avoid acidic foods like lemon/lime/orange/tomato, and avoid greasy/spicy foods.  We have given you 1 dose of the ondansetron  here in the clinic.  Make sure you are getting fluids in  Take Tylenol  as needed  Continue using your albuterol  as needed for the shortness of breath or wheezing  If you worsen anyway, or are not improving, please go to the emergency room for further evaluation Please let your rheumatologist know that you feel unable to take the prednisone .      ED Prescriptions     Medication Sig Dispense Auth. Provider   ondansetron  (ZOFRAN -ODT) 4 MG disintegrating tablet Take 1 tablet (4 mg total) by mouth every 8 (eight) hours as needed for nausea or vomiting. 10 tablet Vonna Jarret Torre K, MD      PDMP not reviewed this encounter.   Vonna Sharlet POUR, MD 09/26/24 PERVIS    Vonna Sharlet POUR, MD 09/26/24 [phone]

## 2024-09-26 NOTE — ED Triage Notes (Signed)
 Pt states she is in a lupus flare up since last Friday. She complains of headache, dizziness, joint pain, decreased appetite. She called her rheumatologist and he sent in prednisone  but she does not want to take that since it elevates her blood sugar.

## 2024-09-26 NOTE — Discharge Instructions (Signed)
 Testing for flu and COVID was negative  Your sugar was 124  Ondansetron  dissolved in the mouth every 8 hours as needed for nausea or vomiting. Clear liquids(water, gatorade/pedialyte, ginger ale/sprite, chicken broth/soup) and bland things(crackers/toast, rice, potato, bananas) to eat. Avoid acidic foods like lemon/lime/orange/tomato, and avoid greasy/spicy foods.  We have given you 1 dose of the ondansetron  here in the clinic.  Make sure you are getting fluids in  Take Tylenol  as needed  Continue using your albuterol  as needed for the shortness of breath or wheezing  If you worsen anyway, or are not improving, please go to the emergency room for further evaluation Please let your rheumatologist know that you feel unable to take the prednisone .

## 2024-09-27 NOTE — Telephone Encounter (Signed)
 I do not have a great alternative option to recommend for a flare up right now. I would not recommend using high doses of NSAIDs such as ibuprofen  or aleve  which could worsen asthma related problems. Tylenol  is safe when taken as recommended (3000 mg total per day or less) if she is not already taking this.

## 2024-09-30 ENCOUNTER — Emergency Department (HOSPITAL_COMMUNITY)
Admission: EM | Admit: 2024-09-30 | Discharge: 2024-09-30 | Disposition: A | Discharge status: Home / Self Care | Attending: Emergency Medicine | Admitting: Emergency Medicine

## 2024-09-30 ENCOUNTER — Other Ambulatory Visit: Payer: Self-pay

## 2024-09-30 ENCOUNTER — Encounter (HOSPITAL_COMMUNITY): Payer: Self-pay

## 2024-09-30 DIAGNOSIS — R197 Diarrhea, unspecified: Secondary | ICD-10-CM | POA: Insufficient documentation

## 2024-09-30 DIAGNOSIS — R5383 Other fatigue: Secondary | ICD-10-CM | POA: Insufficient documentation

## 2024-09-30 DIAGNOSIS — R5381 Other malaise: Secondary | ICD-10-CM | POA: Diagnosis not present

## 2024-09-30 DIAGNOSIS — R110 Nausea: Secondary | ICD-10-CM | POA: Diagnosis not present

## 2024-09-30 DIAGNOSIS — R519 Headache, unspecified: Secondary | ICD-10-CM | POA: Insufficient documentation

## 2024-09-30 LAB — COMPREHENSIVE METABOLIC PANEL WITH GFR
ALT: 16 U/L (ref 0–44)
AST: 14 U/L — ABNORMAL LOW (ref 15–41)
Albumin: 2.9 g/dL — ABNORMAL LOW (ref 3.5–5.0)
Alkaline Phosphatase: 90 U/L (ref 38–126)
Anion gap: 11 (ref 5–15)
BUN: <5 mg/dL — ABNORMAL LOW (ref 6–20)
CO2: 22 mmol/L (ref 22–32)
Calcium: 8.6 mg/dL — ABNORMAL LOW (ref 8.9–10.3)
Chloride: 106 mmol/L (ref 98–111)
Creatinine, Ser: 0.8 mg/dL (ref 0.44–1.00)
GFR, Estimated: >60 mL/min (ref >60)
Glucose, Bld: 161 mg/dL — ABNORMAL HIGH (ref 70–99)
Potassium: 3.6 mmol/L (ref 3.5–5.1)
Sodium: 139 mmol/L (ref 135–145)
Total Bilirubin: 0.4 mg/dL (ref 0.0–1.2)
Total Protein: 7 g/dL (ref 6.5–8.1)

## 2024-09-30 LAB — CBC
HCT: 41.8 % (ref 36.0–46.0)
Hemoglobin: 12.8 g/dL (ref 12.0–15.0)
MCH: 25.3 pg — ABNORMAL LOW (ref 26.0–34.0)
MCHC: 30.6 g/dL (ref 30.0–36.0)
MCV: 82.8 fL (ref 80.0–100.0)
Platelets: 285 K/uL (ref 150–400)
RBC: 5.05 MIL/uL (ref 3.87–5.11)
RDW: 14.2 % (ref 11.5–15.5)
WBC: 8.5 K/uL (ref 4.0–10.5)
nRBC: 0 % (ref 0.0–0.2)

## 2024-09-30 LAB — LIPASE, BLOOD: Lipase: 23 U/L (ref 11–51)

## 2024-09-30 LAB — RESP PANEL BY RT-PCR (RSV, FLU A&B, COVID)  RVPGX2
Influenza A by PCR: NEGATIVE
Influenza B by PCR: NEGATIVE
Resp Syncytial Virus by PCR: NEGATIVE
SARS Coronavirus 2 by RT PCR: NEGATIVE

## 2024-09-30 LAB — HCG, SERUM, QUALITATIVE: Preg, Serum: NEGATIVE

## 2024-09-30 MED ORDER — PROMETHAZINE HCL 25 MG/ML IJ SOLN
12.5000 mg | Freq: Once | INTRAMUSCULAR | Status: AC
  Administered 2024-09-30: 12.5 mg via INTRAMUSCULAR
  Filled 2024-09-30: qty 1

## 2024-09-30 MED ORDER — SODIUM CHLORIDE 0.9 % IV BOLUS
1000.0000 mL | Freq: Once | INTRAVENOUS | Status: AC
  Administered 2024-09-30: 1000 mL via INTRAVENOUS

## 2024-09-30 MED ORDER — PROMETHAZINE HCL 25 MG PO TABS: 25.0000 mg | ORAL_TABLET | Freq: Four times a day (QID) | ORAL | 0 refills | Status: DC | PRN

## 2024-09-30 MED ORDER — SODIUM CHLORIDE 0.9 % IV SOLN
12.5000 mg | Freq: Four times a day (QID) | INTRAVENOUS | Status: DC | PRN
  Filled 2024-09-30: qty 0.5

## 2024-09-30 NOTE — ED Notes (Addendum)
 Pt slowly ambulated to bathroom with moderate difficulty

## 2024-09-30 NOTE — ED Triage Notes (Signed)
 Pt has multiple complaints: diarrhea, dizziness, emesis, headache. Pt states she thinks she is having a lupus flare up. Pt c.o pain all over

## 2024-09-30 NOTE — Discharge Instructions (Addendum)
 Return for any problem.  ?

## 2024-09-30 NOTE — ED Provider Notes (Signed)
 [place] EMERGENCY DEPARTMENT AT [facility] Provider Note   CSN: [id number] Arrival date & time: 09/30/24  9045     Patient presents with: Headache and Diarrhea   Jade Lopez is a 28 y.o. female.   28 year old female with prior medical history as detailed below presents for evaluation.  Patient reports 1 week of malaise, fatigue, achy pain all over, nausea, diarrhea.  She has been seen by urgent care for the same complaint.  She is diagnosed with possible viral syndrome.  Patient has discussed her symptoms with her rheumatologist.  Rheumatology recommended a burst of steroids.  She is not interested in taking prednisone  because it makes my sugar go too high.  She denies fever.  She denies vomiting.  The history is provided by the patient and medical records.       Prior to Admission medications   Medication Sig Start Date End Date Taking? Authorizing Provider  Accu-Chek Softclix Lancets lancets daily. 12/15/22   [provider]  albuterol  (PROVENTIL ) (2.5 MG/3ML) 0.083% nebulizer solution Take 3 mLs (2.5 mg total) by nebulization every 6 (six) hours as needed for wheezing or shortness of breath. 12/08/23   Loreda Myla SAUNDERS, NP  albuterol  (VENTOLIN  HFA) 108 (90 Base) MCG/ACT inhaler Inhale 1-2 puffs into the lungs every 6 (six) hours as needed for wheezing or shortness of breath. 05/10/23   Odell Celinda Balo, MD  BIOTIN PO Take 2 capsules by mouth daily. Hair, skin nails Patient not taking: Reported on 08/26/2024    [provider]  Blood Glucose Monitoring Suppl (MM EASY TOUCH GLUCOSE METER) w/Device KIT Please check your blood sugar daily, in the morning. 12/08/22   Franchot Novel, MD  Blood Glucose Monitoring Suppl DEVI 1 each by Does not apply route 3 (three) times daily. May dispense any manufacturer covered by patient's insurance. 05/10/23   Odell Celinda Balo, MD  budesonide  (PULMICORT ) 1 MG/2ML nebulizer solution SMARTSIG:2 Milliliter(s) Twice  Daily 06/14/24   [provider]  budesonide -formoterol  (SYMBICORT ) 80-4.5 MCG/ACT inhaler Inhale 2 puffs into the lungs 2 (two) times daily as needed. 05/19/23   Amoako, Prince, MD  cetirizine (ZYRTEC) 10 MG tablet Take 10 mg by mouth daily. Patient not taking: Reported on 08/26/2024 04/06/24   [provider]  clotrimazole (LOTRIMIN) 1 % cream Apply 1 Application topically 2 (two) times daily. 08/26/24   Raspet, Erin K, PA-C  cyclobenzaprine  (FLEXERIL ) 5 MG tablet Take 1 tablet (5 mg total) by mouth at bedtime as needed. Patient not taking: Reported on 08/26/2024 08/06/24   Christopher Savannah, PA-C  folic acid  (FOLVITE ) 1 MG tablet Take 1 tablet (1 mg total) by mouth daily. 08/07/24   Jeannetta Lonni ORN, MD  glipiZIDE  2.5 MG TABS Take 2.5 mg by mouth 2 (two) times daily before a meal. Patient not taking: No sig reported 06/01/24   Christopher Savannah, PA-C  Glucose Blood (BLOOD GLUCOSE TEST STRIPS) STRP 1 each by Does not apply route 3 (three) times daily. Use as directed to check blood sugar. May dispense any manufacturer covered by patient's insurance and fits patient's device. 05/10/23   Odell Celinda Balo, MD  ipratropium-albuterol  (DUONEB) 0.5-2.5 (3) MG/3ML SOLN SMARTSIG:3 Milliliter(s) Every 6-8 Hours PRN    [provider]  Lancet Device MISC 1 each by Does not apply route 3 (three) times daily. May dispense any manufacturer covered by patient's insurance. 05/10/23   Odell Celinda Balo, MD  Lancets MISC 1 each by Does not apply route 3 (three) times  daily. Use as directed to check blood sugar. May dispense any manufacturer covered by patient's insurance and fits patient's device. 05/10/23   Odell Celinda Balo, MD  leflunomide  (ARAVA ) 10 MG tablet Take 1 tablet (10 mg total) by mouth daily. 08/30/24   Rice, Lonni ORN, MD  lidocaine  (LIDODERM ) 5 % Place 1 patch onto the skin daily. Remove & Discard patch within 12 hours or as directed by MD Patient not taking: No sig reported  01/25/24   Kommor, Lum, MD  medroxyPROGESTERone  (DEPO-PROVERA ) 150 MG/ML injection Inject 150 mg into the muscle every 3 (three) months.    [provider]  MOUNJARO  10 MG/0.5ML Pen SMARTSIG:10 Milligram(s) SUB-Q Once a Week 04/20/24   [provider]  Multiple Vitamins-Minerals (ZINC PO) Take 1 tablet by mouth daily. Patient not taking: Reported on 08/26/2024    [provider]  naloxone  (NARCAN ) nasal spray 4 mg/0.1 mL SMARTSIG:Both Nares 03/31/24   [provider]  norethindrone  (AYGESTIN ) 5 MG tablet Take 10 mg by mouth at bedtime. 12/11/22   [provider]  nystatin (MYCOSTATIN) 100000 UNIT/ML suspension Take 5 mLs by mouth 4 (four) times daily. Patient not taking: No sig reported 04/06/24   [provider]  omeprazole (PRILOSEC) 40 MG capsule Take 40 mg by mouth daily. Patient not taking: Reported on 08/26/2024 05/08/24   [provider]  ondansetron  (ZOFRAN -ODT) 4 MG disintegrating tablet Take 1 tablet (4 mg total) by mouth every 8 (eight) hours as needed for nausea or vomiting. 09/26/24   Banister, Pamela K, MD  oxyCODONE -acetaminophen  (PERCOCET) 10-325 MG tablet Take 1 tablet by mouth 3 (three) times daily as needed. 09/07/24   [provider]  predniSONE  (DELTASONE ) 10 MG tablet Take 4 tablets (40 mg total) by mouth daily with breakfast for 3 days, THEN 3 tablets (30 mg total) daily with breakfast for 3 days, THEN 2 tablets (20 mg total) daily with breakfast for 3 days, THEN 1 tablet (10 mg total) daily with breakfast for 3 days. 09/25/24 10/07/24  Jeannetta Lonni ORN, MD  pregabalin  (LYRICA ) 75 MG capsule Take 1 capsule (75 mg total) by mouth 3 (three) times daily. 07/06/24   Rice, Lonni ORN, MD  SUMAtriptan  (IMITREX ) 100 MG tablet Take 1 tablet (100 mg total) by mouth once as needed for up to 1 dose for migraine (Take 1 tablet at onset. may repeat an additional tablet in 2 hours if needed (Do not exceed more than 2  tablet in 24 hrs)). Patient not taking: No sig reported 02/28/24   Dohmeier, Dedra, MD  tirzepatide  (MOUNJARO ) 7.5 MG/0.5ML Pen Inject 7.5 mg into the skin once a week. Patient not taking: No sig reported 01/31/24   [provider]  topiramate  (TOPAMAX ) 25 MG tablet Take 1 tablet (25 mg total) by mouth 2 (two) times daily. Patient not taking: Reported on 08/26/2024 11/08/23   Dohmeier, Dedra, MD  triamcinolone ointment (KENALOG) 0.1 % Apply 1 Application topically daily at 12 noon.    [provider]  Turmeric, Curcuma [place], (TURMERIC ROOT) POWD Take 15 mLs by mouth daily. Put in food    [provider]    Allergies: Clindamycin/lincomycin, Gabapentin, Other, Pregabalin , Cephalexin , Gadolinium derivatives, Iodinated contrast media, Meloxicam, Prednisone , and Clindamycin    Review of Systems  All other systems reviewed and are negative.   Updated Vital Signs BP (!) 125/92 (BP Location: Left Wrist)   Pulse (!) 106   Temp 98.5 F (36.9 C)   Resp 19  Ht 5' 4.5 (1.638 m)   Wt (!) 180.1 kg   LMP  (LMP Unknown) Comment: none due to depo  SpO2 98%   BMI 67.09 kg/m   Physical Exam Vitals and nursing note reviewed.  Constitutional:      General: She is not in acute distress.    Appearance: Normal appearance. She is well-developed.  HENT:     Head: Normocephalic and atraumatic.  Eyes:     Conjunctiva/sclera: Conjunctivae normal.     Pupils: Pupils are equal, round, and reactive to light.  Cardiovascular:     Rate and Rhythm: Normal rate and regular rhythm.     Heart sounds: Normal heart sounds.  Pulmonary:     Effort: Pulmonary effort is normal. No respiratory distress.     Breath sounds: Normal breath sounds.  Abdominal:     General: There is no distension.     Palpations: Abdomen is soft.     Tenderness: There is no abdominal tenderness.  Musculoskeletal:        General: No deformity. Normal range of motion.     Cervical back: Normal range of  motion and neck supple.  Skin:    General: Skin is warm and dry.  Neurological:     General: No focal deficit present.     Mental Status: She is alert and oriented to person, place, and time.     (all labs ordered are listed, but only abnormal results are displayed) Labs Reviewed  CBC - Abnormal; Notable for the following components:      Result Value   MCH 25.3 (*)    All other components within normal limits  RESP PANEL BY RT-PCR (RSV, FLU A&B, COVID)  [id number]  HCG, SERUM, QUALITATIVE  LIPASE, BLOOD  COMPREHENSIVE METABOLIC PANEL WITH GFR    EKG: None  Radiology: No results found.   Procedures   Medications Ordered in the ED  sodium chloride  0.9 % bolus 1,000 mL (has no administration in time range)  promethazine  (PHENERGAN ) 12.5 mg in sodium chloride  0.9 % 50 mL IVPB (has no administration in time range)                                    Medical Decision Making Patient is presenting with constellation of symptoms most consistent with likely viral syndrome.  Patient reports that her viral symptoms may be exacerbating her underlying lupus.  She complains of achy pain.  She has been given a prednisone  taper from her rheumatologist.  She is hesitant to take this because she is concerned that her sugars will become very high secondary to prednisone .  Obtained labs are reassuringly without significant acute abnormality identified.  Patient does feel improved with IV fluids and Phenergan .  Patient is agreeable with plan to discharge home.  I will provide her with a prescription for Phenergan  for her nausea.  Patient is advised to follow-up closely with her rheumatologist on Monday for further treatment options.  Strict return precautions given and understood.  Amount and/or Complexity of Data Reviewed Labs: ordered.  Risk Prescription drug management.        Final diagnoses:  Diarrhea, unspecified type  Nausea    ED Discharge Orders          Ordered     promethazine  (PHENERGAN ) 25 MG tablet  Every 6 hours PRN        09/30/24 1342  Laurice Maude BROCKS, MD 09/30/24 1344

## 2024-10-03 NOTE — Progress Notes (Signed)
 "  Office Visit Note  Patient: Jade Lopez             Date of Birth: 02/12/1996           MRN: [id number]             PCP: Jerel Gee, NP Referring: Jerel Gee, NP Visit Date: 10/16/2024   Subjective:  Medical Management of Chronic Issues (Arava  not helping , full body pain , unable to tolerate prednisone  due to diabetes. )   Discussed the use of AI scribe software for clinical note transcription with the patient, who gave verbal consent to proceed.  History of Present Illness   Jade Lopez is a 28 y.o. female here for follow up with chronic pain in multiple areas multifactorial with FMS, thoracic spine osteoarthitis, and history of lupus recently seronegative for inflammatory arthritis     She experiences persistent pain primarily in her back and hips, despite being on leflunomide  and Lyrica . Initially, leflunomide  provided some relief, but following a significant flare-up, its effectiveness diminished. She is currently on 10 mg of leflunomide  and has increased her Lyrica  dose to 75 mg, which helps with fibromyalgia but not with the overall pain.  A significant flare-up a few weeks ago included swelling in her legs, hands, and lips, as well as a skin rash. She also developed oral thrush, which was severe and associated with dry mouth. No recent yeast rashes on the skin have been reported, and she has not been prescribed any medication for the thrush.  She has a history of asthma and is currently using Symbicort . She also uses a rescue inhaler as needed. She recently went to the emergency room and was told she had a possible viral infection, though she was negative for flu and COVID. She also mentions recent congestion and a cough with mucus that is difficult to expectorate.  She has a history of diabetes and is concerned about the impact of prednisone  on her condition. She is currently managing her pain with oxycodone  through a pain management specialist and reports no recent  changes in this regimen. She has tried various medications in the past, including methotrexate , Celebrex , and Flexeril , with limited success.       Previous HPI 07/06/2024 Jade Lopez is a 28 y.o. female here for follow up with chronic pain in multiple areas multifactorial with FMS, thoracic spine osteoarthitis, and history of lupus recently seronegative for inflammatory arthritis     She experiences worsening joint pain, particularly in her back and left shoulder. The shoulder pain has been severe in the past, limiting her ability to use it, but currently, it is only mildly painful. She also describes pain on the left side, possibly around the rib cage, extending to the back.   She has been taking Lyrica  50 mg three times a day for her pain, which she has been on for years. Her past medical history includes a diagnosis of systemic lupus.   No increased swelling, rashes, skin breakouts, bumps, or cysts. She is currently on norethindrone  and receives Depo shots for contraception. No change in symptoms reported after starting course of oral doxycycline .      Previous HPI 04/27/2024 Jade Lopez is a 28 y.o. female here for follow up with chronic pain in multiple areas multifactorial with FMS, thoracic spine osteoarthitis, and history of lupus recently seronegative for inflammatory arthritis. She presents with recurrent skin rashes and asthma who presents with weakness and body aches.   She has been experiencing  weakness and body aches for the past two to three days, which she attributes to a flare-up of her condition rather than an acute illness. No recent history of upper respiratory or viral illnesses.   She has a history of recurrent skin rashes, primarily affecting the center of her face, causing burning sensations described as 'like somebody lit my face on fire.' She has been documenting these rashes with photographs and has been taking antibiotics for skin abscesses. The rashes also  appear elsewhere but are less severe. Currently, there are no new rashes under her arms or on her abdomen.   She experiences swelling in her feet and hands, which she describes as typical for her condition. No color changes in her hands or fingers.   Her asthma symptoms have been worsening, potentially related to mold in her apartment. She has consulted an allergist, who suggested allergies might be a factor, although she does not typically have issues with pollen. Further testing for mold exposure is pending.         Previous HPI 01/13/2024 Jade Lopez is a 28 y.o. female here for follow up with chronic pain in multiple areas multifactorial with FMS, thoracic spine osteoarthitis, and history of lupus recently seronegative for inflammatory arthritis.    She experiences chronic joint pain primarily in her knees, shoulders, and back, with recent flare-ups increasing in frequency and severity. She has a history of a back fracture, which is currently stable. No new falls or injuries have occurred. No new skin rashes or changes, and no significant back pain beyond her usual discomfort.   She took hydroxychloroquine  for several months without significant improvement. She is prescribed Lyrica  at 50 mg three times a day, which she has been on for about a year. She also uses naproxen  and Tylenol  as needed for pain relief. Cyclobenzaprine  is taken occasionally but is not sure about use along with lyrica .   She experiences facial swelling and occasional sores in her mouth, specifically on the inner side of her jaw, which are sometimes painful. She uses salt water rinses to manage these sores. Swelling is noted in her face but not more than usual in her legs.   She recently felt unwell with wheezing, for which she visited her primary care provider. X-rays were normal, but she continued to feel unwell. Prednisone  was given both in the emergency room and by her primary care provider, but she did not  experience significant relief. She eventually started to feel better over time. No new respiratory infections.      Previous HPI 10/25/2023 Jade Lopez is a 28 y.o. female here for follow up with history of lupus. They have completed a course of antibiotics for the breast infection. They were started on hydroxychloroquine  400 mg daily at the last visit to manage the joint inflammation. They report a slight improvement in symptoms, particularly in the left leg, which is now pain-free. However, they also note an increase in shoulder pain. They have experienced some nausea but are unsure if it is related to the medication. They deny any new illnesses or rashes, but mention a small light spot on the left thigh.   The patient also reports persistent pain in both shoulders, which has recently increased. The pain radiates down the arm but does not cause numbness or affect grip strength. They also report occasional itching and burning in the ears.   Previous HPI 08/30/2023 Jade Lopez is a 28 y.o. female here for follow up and with history of  lupus.  Workup at our initial visit was negative for any disease specific antibodies but did have moderately elevated sedimentation rate and CRP.  Still having a lot of body pains and stiffness not localized very well to specific joints.  Still seeing leg swelling worse in the evening.  Worsening of the right breast cysts not diagnosed with abscess for which it sounds like she is going to need some drainage or debridement.   Previous HPI 07/29/23 Jade Lopez is a 28 y.o. female here for evaluation and management with history of lupus.  She was not seeing any rheumatologist previously had been managed through primary care office with prolonged oral prednisone  treatment.  From her history sounds like original diagnosis was based on joint inflammation and abnormal serology.  And more recently reviewed notes may have also experienced a course of intermittent fevers  without clear underlying cause.  Also previous diagnosis of fibromyalgia syndrome and takes Lyrica  50 mg 3 times daily.  Currently has joint pain in multiple areas mostly worse on her right side throughout upper extremity and in the right knee.  She previously fractured the right ankle does not have as much pain in the site as an others.  She gets swelling in multiple areas legs are worse by the evening time. Besides joint pain multiple areas she has some frequent or recurrent skin lesions sounds like cysts or small abscesses in the axilla groin and breast areas.  Saw dermatology evaluation at some point and found to have some type of dermatitis but was not diagnosed as hidradenitis. Has chronic eye and mouth dryness not associated with dental complications vision change or eye inflammation.  Reports some hair loss or thinning not associated with any scalp rash or specific distribution. She does have chronic fatigue, concentration difficulty, dizziness, headaches, chronic diarrhea.     12/2022 ANA neg dsDNA neg    Review of Systems  Constitutional:  Positive for fatigue.  HENT:  Positive for mouth dryness. Negative for mouth sores.   Eyes:  Positive for dryness.  Respiratory:  Positive for shortness of breath.   Cardiovascular:  Positive for chest pain. Negative for palpitations.  Gastrointestinal:  Positive for diarrhea. Negative for blood in stool and constipation.  Endocrine: Positive for increased urination.  Genitourinary:  Negative for involuntary urination.  Musculoskeletal:  Positive for joint pain, gait problem, joint pain, joint swelling, myalgias, muscle weakness, morning stiffness, muscle tenderness and myalgias.  Skin:  Negative for color change, rash, hair loss and sensitivity to sunlight.  Allergic/Immunologic: Positive for susceptible to infections.  Neurological:  Positive for dizziness and headaches.  Hematological:  Positive for swollen glands.  Psychiatric/Behavioral:   Positive for depressed mood and sleep disturbance. The patient is not nervous/anxious.     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   Chronic post-traumatic headache, not intractable 10/28/2023   Hypersomnia with sleep apnea 10/28/2023   Visual changes 10/28/2023   Abnormal laboratory test result 07/29/2023   Traumatic brain injury (HCC) 07/14/2023   History of nausea and vomiting 05/19/2023   Fibromyalgia 04/11/2023   Abscess of right breast 04/03/2023   Abdominal wall pain 03/01/2023   Sleep related headaches 02/24/2023   OSA on CPAP 02/24/2023   Sleeps in sitting position due to orthopnea 02/24/2023   Menorrhagia 01/28/2023   Healthcare maintenance 01/28/2023   Chest pain 01/28/2023  Back pain 01/28/2023   Type 2 diabetes mellitus (HCC) 11/28/2022   History of systemic lupus erythematosus (HCC) 11/28/2022   Class 3 obesity (HCC) 11/28/2022   Endometriosis 11/28/2022   Anxiety 11/28/2022   Vitamin D  deficiency 09/13/2012   Asthma 08/22/2012    Past Medical History:  Diagnosis Date   Asthma    Autoimmune disease    lupus   Blood clots in brain 2020   Class 3 obesity (HCC)    Endometriosis    Endometritis    Fibromyalgia    Near syncope 05/09/2023   Sleep apnea    Sleep related headaches 02/24/2023   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   T2DM (type 2 diabetes mellitus) (HCC)    TOA (tubo-ovarian abscess)     Family History  Problem Relation Age of Onset   Diabetes Mother    Hypertension Mother    Migraines Mother    Diabetes Father    Hypertension Father    Sleep apnea Father    Past Surgical History:  Procedure Laterality Date   BREAST LUMPECTOMY Right 2024   CHOLECYSTECTOMY     DILATATION AND CURETTAGE/HYSTEROSCOPY WITH MINERVA     3 total   INCISION AND DRAINAGE ABSCESS  2021   buttocks   IR LUMBAR PUNCTURE  11/15/2023    RADIOLOGY WITH ANESTHESIA N/A 11/15/2023   Procedure: Lumbar Puncture;  Surgeon: Karalee Wilkie POUR, MD;  Location[facility];  Service: Radiology;  Laterality: N/A;   TONSILLECTOMY AND ADENOIDECTOMY     Social History   Social History Narrative   Pt lives alone    Pt not working    Immunization History  Administered Date(s) Administered   Influenza, Seasonal, Injecte, Preservative Fre 08/08/2008   PPD Test 10/07/2022   Td 07/17/2015     Objective: Vital Signs: BP 117/83   Pulse 98   Temp 98.4 F (36.9 C)   Resp 16   Ht 5' 4.5 (1.638 m)   Wt (!) 394 lb 6.4 oz (178.9 kg)   LMP  (LMP Unknown) Comment: none due to depo  BMI 66.65 kg/m    Physical Exam Constitutional:      Appearance: She is obese.  Eyes:     Conjunctiva/sclera: Conjunctivae normal.  Cardiovascular:     Rate and Rhythm: Normal rate and regular rhythm.  Pulmonary:     Effort: Pulmonary effort is normal.     Breath sounds: Normal breath sounds.  Skin:    General: Skin is warm and dry.     Findings: Rash present.     Comments: Patches of scaly skin along medial border of eyebrows, on cheeks, no erythema    Neurological:     Mental Status: She is alert.  Psychiatric:        Mood and Affect: Mood normal.      Musculoskeletal Exam:  Shoulders full ROM some pain with movement but intact, no focal tenderness Elbows full ROM no tenderness or swelling Wrists full ROM no tenderness or swelling Fingers full ROM no tenderness or swelling Extensive muscular tenderness to pressure throughout neck, back, arms, and legs Knees full ROM no tenderness or swelling, right knee patellofemoral crepitus  Investigation: No additional findings.  Imaging: DG Chest Portable 1 View Result Date: 10/29/2024 EXAM: 1 VIEW(S) XRAY OF THE CHEST 10/29/2024 10:05:00 PM COMPARISON: 06/01/2024 CLINICAL HISTORY: shob FINDINGS: LUNGS AND PLEURA: Hypoinflated but generally clear lungs. No pleural effusion. No pneumothorax. HEART AND  MEDIASTINUM: No acute abnormality of the cardiac and mediastinal silhouettes.  BONES AND SOFT TISSUES: No acute osseous abnormality. IMPRESSION: 1. Hypoinflation. No acute cardiopulmonary process. Electronically signed by: Norman Gatlin MD 10/29/2024 10:10 PM EST RP Workstation: [id number]    Recent Labs: Lab Results  Component Value Date   WBC 9.5 10/29/2024   HGB 13.7 10/29/2024   PLT 252 10/29/2024   NA 138 10/29/2024   K 4.0 10/29/2024   CL 103 10/29/2024   CO2 24 10/29/2024   GLUCOSE 186 (H) 10/29/2024   BUN 10 10/29/2024   CREATININE 0.79 10/29/2024   BILITOT 0.4 09/30/2024   ALKPHOS 90 09/30/2024   AST 14 (L) 09/30/2024   ALT 16 09/30/2024   PROT 7.0 09/30/2024   ALBUMIN 2.9 (L) 09/30/2024   CALCIUM  8.9 10/29/2024   QFTBGOLDPLUS NEGATIVE 07/14/2024    Speciality Comments: No specialty comments available.  Procedures:  No procedures performed Allergies: Clindamycin/lincomycin, Gabapentin, Other, Pregabalin , Cephalexin , Gadolinium derivatives, Iodinated contrast media, Meloxicam, Prednisone , and Clindamycin   Assessment / Plan:     Visit Diagnoses: Seronegative rheumatoid arthritis (HCC) - Plan: leflunomide  (ARAVA ) 10 MG tablet Persistent pain in back and hips despite leflunomide  and increased Lyrica . Previous methotrexate  effective but caused yeast rash. Considering Humira for efficacy in inflammatory arthritis and no increased fungal infection risk. - Leflunomide  10 mg daily - Initiate adalimumab 40 mg [place] q14days  - Monitor response to Humira and adjust as necessary.  High risk medication use Reviewed risk of Humira including injection site reactions, cytopenias, infections, malignancy with long-term treatment.  Does not have any significant history of severe congestive heart failure or cancer as contraindication to treatment.  Thrush - Plan: nystatin  (MYCOSTATIN ) 100000 UNIT/ML suspension Recurrent thrush likely due to Symbicort  inhaler use. No current skin yeast  rashes. Celestino may be related to dry mouth and steroid use. - Prescribed nystatin  solution for as-needed use. - Consider fluconazole  if thrush persists, with caution due to potential UTI risk.    Fibromyalgia - Plan: pregabalin  (LYRICA ) 75 MG capsule Chronic pain managed with Lyrica  and oxycodone . Previous trials of Flexeril , Tylenol , and Celebrex  ineffective. Avoiding additional short-term pain medications. - Continue current pain management regimen with oxycodone . - Avoid additional short-term pain medications.   Orders: No orders of the defined types were placed in this encounter.  Meds ordered this encounter  Medications   leflunomide  (ARAVA ) 10 MG tablet    Sig: Take 1 tablet (10 mg total) by mouth daily.    Dispense:  90 tablet    Refill:  0   pregabalin  (LYRICA ) 75 MG capsule    Sig: Take 1 capsule (75 mg total) by mouth 3 (three) times daily.    Dispense:  270 capsule    Refill:  0    Needs brand-specific Lyrica    nystatin  (MYCOSTATIN ) 100000 UNIT/ML suspension    Sig: Take up to every 6 hours as needed for thrush swish and swallow    Dispense:  60 mL    Refill:  0     Follow-Up Instructions: Return in about 3 months (around 01/14/2025) for RA on LEF/ADA start f/u 3mos.   Lonni LELON Ester, MD  Note - This record has been created using [employer].  Chart creation errors have been sought, but may not always  have been located. Such creation errors do not reflect on  the standard of medical care. "

## 2024-10-16 ENCOUNTER — Ambulatory Visit: Attending: Internal Medicine | Admitting: Internal Medicine

## 2024-10-16 ENCOUNTER — Encounter: Payer: Self-pay | Admitting: Internal Medicine

## 2024-10-16 VITALS — BP 117/83 | HR 98 | Temp 98.4°F | Resp 16 | Ht 64.5 in | Wt 394.4 lb

## 2024-10-16 DIAGNOSIS — Z79899 Other long term (current) drug therapy: Secondary | ICD-10-CM | POA: Insufficient documentation

## 2024-10-16 DIAGNOSIS — M0600 Rheumatoid arthritis without rheumatoid factor, unspecified site: Secondary | ICD-10-CM | POA: Diagnosis present

## 2024-10-16 DIAGNOSIS — M797 Fibromyalgia: Secondary | ICD-10-CM | POA: Diagnosis present

## 2024-10-16 DIAGNOSIS — B370 Candidal stomatitis: Secondary | ICD-10-CM | POA: Diagnosis present

## 2024-10-16 MED ORDER — LEFLUNOMIDE 10 MG PO TABS: 10.0000 mg | ORAL_TABLET | Freq: Every day | ORAL | 0 refills | Status: AC

## 2024-10-16 MED ORDER — NYSTATIN 100000 UNIT/ML MT SUSP: OROMUCOSAL | 0 refills | Status: AC

## 2024-10-16 MED ORDER — PREGABALIN 75 MG PO CAPS: 75.0000 mg | ORAL_CAPSULE | Freq: Three times a day (TID) | ORAL | 0 refills | Status: AC

## 2024-10-16 NOTE — Patient Instructions (Signed)
       Humira instructions for use (from left to right):    Call Humira Complete if additional injection education is needed: [phone]  Safety: HOLD the dose if you have signs or symptoms of an infection. You can resume once you feel better or back to your baseline. HOLD the dose if you start antibiotics to treat an infection. HOLD the dose for invasive surgeries/procedures. Your surgeon will be able to provide recommendations on when to hold BEFORE and when you are cleared to RESUME.  Injection tips: Store your medication in the fridge (where your milk goes). Allow your medication to reach to room temperature by placing on counter for at least 30 minutes before you inject. Clean the injection area before you inject with an alcohol swab or rubbing alcohol You can SELF-inject in the upper thigh or lower abdomen. Only a caregiver should administer in the back of your arm. Avoid scars, bruises, tender or swollen areas. Alternate injection sites each time. Discard your used medication in a sharps box or a laundry detergent/bleach bottle.  How to manage an injection site reaction: Remember the 5 C's: COUNTER - leave on the counter at least 30 minutes to bring medication to room temperature. This may help prevent stinging COLD - place something cold (like an ice gel pack or cold water bottle) on the injection site just before cleansing with alcohol. This may help reduce pain CLARITIN - use Claritin (generic name is loratadine) for the first two weeks of treatment or the day of, the day before, and the day after injecting. This will help to minimize injection site reactions CORTISONE CREAM - apply if injection site is irritated and itching CALL OFFICE - if injection site reaction is bigger than the size of your fist, looks infected, blisters, or if you develop hives

## 2024-10-25 ENCOUNTER — Telehealth: Payer: Self-pay

## 2024-10-25 DIAGNOSIS — M0600 Rheumatoid arthritis without rheumatoid factor, unspecified site: Secondary | ICD-10-CM

## 2024-10-25 DIAGNOSIS — Z79899 Other long term (current) drug therapy: Secondary | ICD-10-CM

## 2024-10-25 NOTE — Telephone Encounter (Signed)
 Pt reached out to clinic for update on injection that Dr. Jeannetta was prescribing for her, stating that she is isn't doing that great in terms of her current condition. I informed her that I would need to go through the PA process however I first needed him to complete his note so that I would have clinical documentation to submit. I assured her that I would send him a message requesting that he expedite completion of the encounter from 12/8.

## 2024-10-28 ENCOUNTER — Ambulatory Visit (HOSPITAL_COMMUNITY): Admission: EM | Admit: 2024-10-28 | Discharge: 2024-10-28 | Disposition: A | Discharge status: Home / Self Care

## 2024-10-28 ENCOUNTER — Encounter (HOSPITAL_COMMUNITY): Payer: Self-pay | Admitting: Emergency Medicine

## 2024-10-28 DIAGNOSIS — J988 Other specified respiratory disorders: Secondary | ICD-10-CM

## 2024-10-28 DIAGNOSIS — B9789 Other viral agents as the cause of diseases classified elsewhere: Secondary | ICD-10-CM

## 2024-10-28 DIAGNOSIS — R051 Acute cough: Secondary | ICD-10-CM

## 2024-10-28 DIAGNOSIS — J4541 Moderate persistent asthma with (acute) exacerbation: Secondary | ICD-10-CM

## 2024-10-28 LAB — POC COVID19/FLU A&B COMBO
Covid Antigen, POC: NEGATIVE
Influenza A Antigen, POC: NEGATIVE
Influenza B Antigen, POC: NEGATIVE

## 2024-10-28 MED ORDER — ALBUTEROL SULFATE (2.5 MG/3ML) 0.083% IN NEBU
2.5000 mg | INHALATION_SOLUTION | Freq: Once | RESPIRATORY_TRACT | Status: AC
  Administered 2024-10-28: 2.5 mg via RESPIRATORY_TRACT

## 2024-10-28 MED ORDER — IPRATROPIUM-ALBUTEROL 0.5-2.5 (3) MG/3ML IN SOLN
3.0000 mL | Freq: Once | RESPIRATORY_TRACT | Status: AC
  Administered 2024-10-28: 3 mL via RESPIRATORY_TRACT

## 2024-10-28 MED ORDER — ALBUTEROL SULFATE (2.5 MG/3ML) 0.083% IN NEBU
INHALATION_SOLUTION | RESPIRATORY_TRACT | Status: AC
  Filled 2024-10-28: qty 3

## 2024-10-28 MED ORDER — BUDESONIDE 1 MG/2ML IN SUSP: 1.0000 mg | Freq: Every day | RESPIRATORY_TRACT | 0 refills | Status: AC

## 2024-10-28 MED ORDER — IPRATROPIUM-ALBUTEROL 0.5-2.5 (3) MG/3ML IN SOLN
RESPIRATORY_TRACT | Status: AC
  Filled 2024-10-28: qty 3

## 2024-10-28 NOTE — ED Provider Notes (Signed)
 " [facility]    CSN: [id number] Arrival date & time: 10/28/24  1830      History   Chief Complaint Chief Complaint  Patient presents with   Shortness of Breath    HPI Jade Lopez is a 28 y.o. female.   Patient presents with progressively worsening shortness of breath that began 3 days ago.  Patient states that this began very mild and then today became much worse and states that it is significantly worse with exertion and walking long distances.  Patient states that she also has had some chest tightness when she becomes short of breath.  Patient reports that she has had a productive cough and congestion.  Denies fever, body aches, and chills.  Patient states that last night she did have an episode of nausea and diarrhea but this has since subsided.  Patient denies any known sick contacts. Patient does have a history of asthma and she reports that she has been using her albuterol  inhaler and Symbicort  inhalers without much relief.    Of note patient also has a history of lupus and severely uncontrolled diabetes.  Patient states that she currently is on Mounjaro  but does not take any other medications for her diabetes.  Patient states that she is unable to take any steroids as this will significantly increase her blood glucose.  Patient even reported that a single dose of 40 mg of prednisone  will spike her glucose enough to be concerning and therefore is refusing steroids at this time.    Patient reports that she was followed by pulmonology in the past and states that she last saw them a few months ago however I am unable to find any documentation suggesting this.  Patient states that in the past she has been prescribed inhaled steroids in the form of budesonide  nebulizers with relief without causing any complications with her blood glucose.  The history is provided by the patient and medical records.  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/28/2023   Chronic post-traumatic headache, not intractable 10/28/2023   Hypersomnia with sleep apnea 10/28/2023   Visual changes 10/28/2023   Abnormal laboratory test result 07/29/2023   Traumatic brain injury (HCC) 07/14/2023   History of nausea and vomiting 05/19/2023   Fibromyalgia 04/11/2023   Abscess of right breast 04/03/2023   Abdominal wall pain 03/01/2023   Sleep related headaches 02/24/2023   OSA on CPAP 02/24/2023   Sleeps in sitting position due to orthopnea 02/24/2023   Menorrhagia 01/28/2023   Healthcare maintenance 01/28/2023   Chest pain 01/28/2023   Back pain 01/28/2023   Type 2 diabetes mellitus (HCC) 11/28/2022   History of systemic lupus erythematosus (HCC) 11/28/2022   Class 3 obesity (HCC) 11/28/2022   Endometriosis 11/28/2022   Anxiety 11/28/2022   Vitamin D  deficiency 09/13/2012   Asthma 08/22/2012    Past Surgical History:  Procedure Laterality Date   BREAST LUMPECTOMY Right 2024   CHOLECYSTECTOMY     DILATATION AND CURETTAGE/HYSTEROSCOPY WITH MINERVA     3 total   INCISION AND DRAINAGE ABSCESS  2021   buttocks   IR LUMBAR PUNCTURE  11/15/2023   RADIOLOGY WITH ANESTHESIA N/A 11/15/2023   Procedure: Lumbar Puncture;  Surgeon: Karalee Wilkie POUR, MD;  Location: [facility] OR;  Service: Radiology;  Laterality: N/A;   TONSILLECTOMY AND ADENOIDECTOMY      OB History     Gravida  0   Para  0   Term  0   Preterm  0   AB  0   Living  0      SAB  0   IAB  0   Ectopic  0    Multiple  0   Live Births  0            Home Medications    Prior to Admission medications  Medication Sig Start Date End Date Taking? Authorizing Provider  SYMBICORT  160-4.5 MCG/ACT inhaler Inhale into the lungs. 10/23/24  Yes [provider]  Accu-Chek Softclix Lancets lancets daily. 12/15/22   [provider]  albuterol  (PROVENTIL ) (2.5 MG/3ML) 0.083% nebulizer solution Take 3 mLs (2.5 mg total) by nebulization every 6 (six) hours as needed for wheezing or shortness of breath. 12/08/23   Loreda Myla SAUNDERS, NP  albuterol  (VENTOLIN  HFA) 108 (90 Base) MCG/ACT inhaler Inhale 1-2 puffs into the lungs every 6 (six) hours as needed for wheezing or shortness of breath. 05/10/23   Odell Celinda Balo, MD  Blood Glucose Monitoring Suppl (MM EASY TOUCH GLUCOSE METER) w/Device KIT Please check your blood sugar daily, in the morning. 12/08/22   Franchot Novel, MD  Blood Glucose Monitoring Suppl DEVI 1 each by Does not apply route 3 (three) times daily. May dispense any manufacturer covered by patient's insurance. 05/10/23   Odell Celinda Balo, MD  budesonide  (PULMICORT ) 1 MG/2ML nebulizer solution Take 2 mLs (1 mg total) by nebulization daily. 10/28/24   Johnie Flaming A, NP  budesonide -formoterol  (SYMBICORT ) 80-4.5 MCG/ACT inhaler Inhale 2 puffs into the lungs 2 (two) times daily as needed. 05/19/23   Amoako, Prince, MD  clotrimazole  (LOTRIMIN ) 1 % cream Apply 1 Application topically 2 (two) times daily. 08/26/24   Raspet, Erin K, PA-C  Glucose Blood (BLOOD GLUCOSE TEST STRIPS) STRP 1 each by Does not apply route 3 (three) times daily. Use as directed to check blood sugar. May dispense any manufacturer covered by patient's insurance and fits patient's device. 05/10/23   Odell Celinda Balo, MD  ipratropium-albuterol  (DUONEB) 0.5-2.5 (3) MG/3ML SOLN SMARTSIG:3 Milliliter(s) Every 6-8 Hours PRN    [provider]  Lancet Device MISC 1 each by Does not apply route 3 (three) times  daily. May dispense any manufacturer covered by patient's insurance. 05/10/23   Odell Celinda Balo, MD  Lancets MISC 1 each by Does not apply route 3 (three) times daily. Use as directed to check blood sugar. May dispense any manufacturer covered by patient's insurance and fits patient's device. 05/10/23   Odell Celinda Balo, MD  leflunomide  (ARAVA ) 10 MG tablet Take 1 tablet (10 mg total) by mouth daily. 10/16/24   Rice, Lonni ORN, MD  medroxyPROGESTERone  (DEPO-PROVERA ) 150 MG/ML injection Inject 150 mg into the muscle every 3 (three) months.    [provider]  MOUNJARO  10 MG/0.5ML Pen SMARTSIG:10 Milligram(s) SUB-Q Once a Week Patient not taking: Reported on 10/16/2024 04/20/24   [provider]  MOUNJARO  12.5 MG/0.5ML Pen Inject 12.5 mg into the skin once a week. 10/11/24   [provider]  naloxone  (NARCAN ) nasal spray 4 mg/0.1 mL SMARTSIG:Both Nares 03/31/24   [provider]  norethindrone  (AYGESTIN ) 5 MG tablet Take 10 mg by mouth at bedtime. 12/11/22   [provider]  nystatin  (MYCOSTATIN ) 100000 UNIT/ML suspension Take up to every 6 hours as needed for thrush swish and swallow 10/16/24   Rice, Lonni ORN, MD  ondansetron  (ZOFRAN -ODT) 4 MG disintegrating tablet Take 1 tablet (4 mg total) by mouth every 8 (eight) hours as needed for nausea or vomiting. 09/26/24   Vonna Sharlet POUR, MD  oxyCODONE -acetaminophen  (PERCOCET) 10-325 MG tablet Take 1 tablet by mouth 3 (three) times daily as needed. 09/07/24   [provider]  pregabalin  (LYRICA ) 75 MG capsule Take 1 capsule (75 mg total) by mouth 3 (three) times daily. 10/16/24   Jeannetta Lonni ORN, MD  tirzepatide  (MOUNJARO ) 7.5 MG/0.5ML Pen Inject 7.5 mg into the skin once a week. Patient not taking: Reported on 10/16/2024 01/31/24   [provider]  topiramate  (TOPAMAX ) 25 MG tablet Take 1 tablet (25 mg total) by mouth 2 (two) times daily. Patient not taking: Reported on 10/16/2024  11/08/23   Dohmeier, Dedra, MD  triamcinolone ointment (KENALOG) 0.1 % Apply 1 Application topically daily at 12 noon.    [provider]  Turmeric, Curcuma Longa, (TURMERIC ROOT) POWD Take 15 mLs by mouth daily. Put in food    [provider]    Family History Family History  Problem Relation Age of Onset   Diabetes Mother    Hypertension Mother    Migraines Mother    Diabetes Father    Hypertension Father    Sleep apnea Father     Social History Social History[1]   Allergies   Clindamycin/lincomycin, Gabapentin, Other, Pregabalin , Cephalexin , Gadolinium derivatives, Iodinated contrast media, Meloxicam, Prednisone , and Clindamycin   Review of Systems Review of Systems  Respiratory:  Positive for shortness of breath.    Per HPI  Physical Exam Triage Vital Signs ED Triage Vitals  Encounter Vitals Group     BP 10/28/24 1858 122/88     Girls Systolic BP Percentile --      Girls Diastolic BP Percentile --      Boys Systolic BP Percentile --      Boys Diastolic BP Percentile --      Pulse Rate 10/28/24 1858 (!) 108     Resp 10/28/24 1858 19     Temp 10/28/24 1858 98.1 F (36.7 C)     Temp Source 10/28/24 1858 Oral     SpO2 10/28/24 1858 98 %     Weight --      Height --      Head Circumference --      Peak Flow --      Pain Score 10/28/24 1856 0     Pain Loc --      Pain Education --      Exclude from Growth Chart --    No data found.  Updated Vital Signs BP 122/88 (BP Location: Left Arm)   Pulse (!) 108   Temp 98.1 F (36.7 C) (Oral)   Resp 19   SpO2 98%   Visual Acuity Right Eye Distance:   Left Eye Distance:   Bilateral Distance:    Right Eye Near:   Left Eye Near:    Bilateral Near:     Physical  Exam Vitals and nursing note reviewed.  Constitutional:      General: She is awake. She is not in acute distress.    Appearance: Normal appearance. She is well-developed and well-groomed. She is not ill-appearing.  HENT:      Right Ear: Tympanic membrane, ear canal and external ear normal.     Left Ear: Tympanic membrane, ear canal and external ear normal.     Nose: Congestion and rhinorrhea present.     Mouth/Throat:     Mouth: Mucous membranes are moist.     Pharynx: Posterior oropharyngeal erythema present. No oropharyngeal exudate.  Cardiovascular:     Rate and Rhythm: Regular rhythm. Tachycardia present.  Pulmonary:     Effort: Tachypnea present.     Breath sounds: Wheezing present.  Skin:    General: Skin is warm and dry.  Neurological:     Mental Status: She is alert.  Psychiatric:        Behavior: Behavior is cooperative.      UC Treatments / Results  Labs (all labs ordered are listed, but only abnormal results are displayed) Labs Reviewed  POC COVID19/FLU A&B COMBO    EKG   Radiology No results found.  Procedures Procedures (including critical care time)  Medications Ordered in UC Medications  ipratropium-albuterol  (DUONEB) 0.5-2.5 (3) MG/3ML nebulizer solution 3 mL (3 mLs Nebulization Given 10/28/24 1915)  albuterol  (PROVENTIL ) (2.5 MG/3ML) 0.083% nebulizer solution 2.5 mg (2.5 mg Nebulization Given 10/28/24 1951)    Initial Impression / Assessment and Plan / UC Course  I have reviewed the triage vital signs and the nursing notes.  Pertinent labs & imaging results that were available during my care of the patient were reviewed by me and considered in my medical decision making (see chart for details).     Patient is overall well-appearing.  Vitals are stable.  Mild tachycardia present.  Wheezing auscultated throughout all lung fields.  COVID and flu testing negative.  Symptoms likely related to a viral respiratory illness that is exacerbated underlying asthma.  Given DuoNeb and albuterol  nebulizer in clinic with relief of wheezing.    Refilled budesonide  nebulizers to use once for additional relief of asthma exacerbation.  Discussed importance of following up with primary  care provider for further evaluation of chronic asthma and diabetes.  Discussed follow-up, return, and strict ER precautions. Final Clinical Impressions(s) / UC Diagnoses   Final diagnoses:  Acute cough  Moderate persistent asthma with acute exacerbation  Viral respiratory illness     Discharge Instructions      Your COVID and flu testing were negative today.  I do believe that your symptoms are likely related to a viral respiratory illness that has exacerbated your asthma. You are given nebulizer treatments today in clinic with relief of your wheezing. I have refilled budesonide  nebulizers that you can use once daily for additional relief of wheezing. Continue using your Symbicort  inhaler daily and using your albuterol  inhaler every 4-6 hours as needed for shortness of breath and wheezing. Please follow-up with your primary care provider for further evaluation and management of your chronic asthma and diabetes. If you develop worsening shortness of breath, severe chest pain, severe weakness, or passing out please seek immediate medical treatment in the emergency department.     ED Prescriptions     Medication Sig Dispense Auth. Provider   budesonide  (PULMICORT ) 1 MG/2ML nebulizer solution Take 2 mLs (1 mg total) by nebulization daily. 60 mL Johnie Rumaldo LABOR, NP  PDMP not reviewed this encounter.    [1]  Social History Tobacco Use   Smoking status: Never    Passive exposure: Past   Smokeless tobacco: Never  Vaping Use   Vaping status: Never Used  Substance Use Topics   Alcohol use: Yes    Comment: occasional   Drug use: No     Johnie Rumaldo LABOR, NP 10/28/24 2010  "

## 2024-10-28 NOTE — Discharge Instructions (Addendum)
 Your COVID and flu testing were negative today.  I do believe that your symptoms are likely related to a viral respiratory illness that has exacerbated your asthma. You are given nebulizer treatments today in clinic with relief of your wheezing. I have refilled budesonide  nebulizers that you can use once daily for additional relief of wheezing. Continue using your Symbicort  inhaler daily and using your albuterol  inhaler every 4-6 hours as needed for shortness of breath and wheezing. Please follow-up with your primary care provider for further evaluation and management of your chronic asthma and diabetes. If you develop worsening shortness of breath, severe chest pain, severe weakness, or passing out please seek immediate medical treatment in the emergency department.

## 2024-10-28 NOTE — ED Triage Notes (Signed)
 Pt c/o SOB and hurts to breath in lung area for 3 days. Reports hx asthma and using inhalers.

## 2024-10-29 ENCOUNTER — Encounter (HOSPITAL_COMMUNITY): Payer: Self-pay

## 2024-10-29 ENCOUNTER — Emergency Department (HOSPITAL_COMMUNITY)

## 2024-10-29 ENCOUNTER — Emergency Department (HOSPITAL_COMMUNITY)
Admission: EM | Admit: 2024-10-29 | Discharge: 2024-10-30 | Disposition: A | Source: Home / Self Care | Attending: Emergency Medicine | Admitting: Emergency Medicine

## 2024-10-29 ENCOUNTER — Other Ambulatory Visit: Payer: Self-pay

## 2024-10-29 DIAGNOSIS — E119 Type 2 diabetes mellitus without complications: Secondary | ICD-10-CM | POA: Diagnosis not present

## 2024-10-29 DIAGNOSIS — J45909 Unspecified asthma, uncomplicated: Secondary | ICD-10-CM | POA: Insufficient documentation

## 2024-10-29 DIAGNOSIS — Z79899 Other long term (current) drug therapy: Secondary | ICD-10-CM | POA: Insufficient documentation

## 2024-10-29 DIAGNOSIS — R0602 Shortness of breath: Secondary | ICD-10-CM | POA: Diagnosis present

## 2024-10-29 LAB — CBC WITH DIFFERENTIAL/PLATELET
Abs Immature Granulocytes: 0.03 K/uL (ref 0.00–0.07)
Basophils Absolute: 0.1 K/uL (ref 0.0–0.1)
Basophils Relative: 1 %
Eosinophils Absolute: 0.3 K/uL (ref 0.0–0.5)
Eosinophils Relative: 3 %
HCT: 44.6 % (ref 36.0–46.0)
Hemoglobin: 13.7 g/dL (ref 12.0–15.0)
Immature Granulocytes: 0 %
Lymphocytes Relative: 33 %
Lymphs Abs: 3.2 K/uL (ref 0.7–4.0)
MCH: 26.1 pg (ref 26.0–34.0)
MCHC: 30.7 g/dL (ref 30.0–36.0)
MCV: 85 fL (ref 80.0–100.0)
Monocytes Absolute: 0.5 K/uL (ref 0.1–1.0)
Monocytes Relative: 6 %
Neutro Abs: 5.5 K/uL (ref 1.7–7.7)
Neutrophils Relative %: 57 %
Platelets: 252 K/uL (ref 150–400)
RBC: 5.25 MIL/uL — ABNORMAL HIGH (ref 3.87–5.11)
RDW: 14.5 % (ref 11.5–15.5)
WBC: 9.5 K/uL (ref 4.0–10.5)
nRBC: 0 % (ref 0.0–0.2)

## 2024-10-29 MED ORDER — IPRATROPIUM-ALBUTEROL 0.5-2.5 (3) MG/3ML IN SOLN
3.0000 mL | Freq: Once | RESPIRATORY_TRACT | Status: AC
  Administered 2024-10-29: 3 mL via RESPIRATORY_TRACT
  Filled 2024-10-29: qty 3

## 2024-10-29 MED ORDER — SODIUM CHLORIDE 0.9 % IV BOLUS
1000.0000 mL | Freq: Once | INTRAVENOUS | Status: AC
  Administered 2024-10-29: 1000 mL via INTRAVENOUS

## 2024-10-29 MED ORDER — PREDNISONE 20 MG PO TABS
40.0000 mg | ORAL_TABLET | Freq: Once | ORAL | Status: AC
  Administered 2024-10-29: 40 mg via ORAL
  Filled 2024-10-29: qty 2

## 2024-10-29 MED ORDER — ALBUTEROL SULFATE (2.5 MG/3ML) 0.083% IN NEBU
10.0000 mg/h | INHALATION_SOLUTION | Freq: Once | RESPIRATORY_TRACT | Status: AC
  Administered 2024-10-29: 10 mg/h via RESPIRATORY_TRACT
  Filled 2024-10-29: qty 12

## 2024-10-29 MED ORDER — SODIUM CHLORIDE 0.9 % IV BOLUS: 1000.0000 mL | Freq: Once | INTRAVENOUS | Status: DC

## 2024-10-29 MED ORDER — METHYLPREDNISOLONE SODIUM SUCC 125 MG IJ SOLR: 125.0000 mg | Freq: Once | INTRAMUSCULAR | Status: DC

## 2024-10-29 MED ORDER — PREDNISONE 20 MG PO TABS: 40.0000 mg | ORAL_TABLET | Freq: Every day | ORAL | 0 refills | Status: AC

## 2024-10-29 NOTE — ED Provider Notes (Signed)
 " [place] EMERGENCY DEPARTMENT AT [facility] Provider Note  CSN: [id number] Arrival date & time: 10/29/24 2026  Chief Complaint(s) Shortness of Breath  HPI Jade Lopez is a 28 y.o. female with obesity, asthma, seronegative rheumatoid arthritis presenting to the emergency department shortness of breath.  Reports over the past few days she has had cough, sore throat, some chills, no fever or bodyaches.  Has also had some nausea and diarrhea.  No sick contacts.  Was seen at urgent care yesterday, received breathing treatment and her inhaler was refilled but she is having persistent and recurrent symptoms.  Denies any productive cough.  She has a listed allergy to prednisone  but reports she is not really allergic, it just causes her blood sugar to become elevated and she feels bad.   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   Super obesity 10/28/2023   Chronic post-traumatic headache, not intractable 10/28/2023   Hypersomnia with sleep apnea 10/28/2023   Visual changes 10/28/2023   Abnormal laboratory test result 07/29/2023   Traumatic brain injury (HCC) 07/14/2023   History of nausea and vomiting 05/19/2023   Fibromyalgia 04/11/2023   Abscess of right breast 04/03/2023   Abdominal wall pain 03/01/2023   Sleep related headaches 02/24/2023   OSA on CPAP 02/24/2023   Sleeps in sitting position due to orthopnea 02/24/2023   Menorrhagia 01/28/2023   Healthcare maintenance 01/28/2023   Chest  pain 01/28/2023   Back pain 01/28/2023   Type 2 diabetes mellitus (HCC) 11/28/2022   History of systemic lupus erythematosus (HCC) 11/28/2022   Class 3 obesity (HCC) 11/28/2022   Endometriosis 11/28/2022   Anxiety 11/28/2022   Vitamin D  deficiency 09/13/2012   Asthma 08/22/2012   Home Medication(s) Prior to Admission medications  Medication Sig Start Date End Date Taking? Authorizing Provider  Accu-Chek Softclix Lancets lancets daily. 12/15/22   [provider]  albuterol  (PROVENTIL ) (2.5 MG/3ML) 0.083% nebulizer solution Take 3 mLs (2.5 mg total) by nebulization every 6 (six) hours as needed for wheezing or shortness of breath. 12/08/23   Loreda Myla SAUNDERS, NP  albuterol  (VENTOLIN  HFA) 108 (90 Base) MCG/ACT inhaler Inhale 1-2 puffs into the lungs every 6 (six) hours as needed for wheezing or shortness of breath. 05/10/23   Odell Celinda Balo, MD  Blood Glucose Monitoring Suppl (MM EASY TOUCH GLUCOSE METER) w/Device KIT Please check your blood sugar daily, in the morning. 12/08/22   Franchot Novel, MD  Blood Glucose Monitoring Suppl DEVI 1 each by Does not apply route 3 (three) times daily. May dispense any manufacturer covered by patient's insurance. 05/10/23   Odell Celinda Balo, MD  budesonide  (PULMICORT ) 1 MG/2ML nebulizer solution Take 2 mLs (1 mg total) by nebulization daily. 10/28/24   Johnie Flaming A, NP  budesonide -formoterol  (SYMBICORT ) 80-4.5 MCG/ACT inhaler Inhale 2 puffs into the lungs 2 (two) times daily as needed. 05/19/23   Amoako, Prince, MD  clotrimazole  (  LOTRIMIN ) 1 % cream Apply 1 Application topically 2 (two) times daily. 08/26/24   Raspet, Erin K, PA-C  Glucose Blood (BLOOD GLUCOSE TEST STRIPS) STRP 1 each by Does not apply route 3 (three) times daily. Use as directed to check blood sugar. May dispense any manufacturer covered by patient's insurance and fits patient's device. 05/10/23   Odell Celinda Balo, MD  ipratropium-albuterol  (DUONEB) 0.5-2.5 (3) MG/3ML SOLN  SMARTSIG:3 Milliliter(s) Every 6-8 Hours PRN    [provider]  Lancet Device MISC 1 each by Does not apply route 3 (three) times daily. May dispense any manufacturer covered by patient's insurance. 05/10/23   Odell Celinda Balo, MD  Lancets MISC 1 each by Does not apply route 3 (three) times daily. Use as directed to check blood sugar. May dispense any manufacturer covered by patient's insurance and fits patient's device. 05/10/23   Odell Celinda Balo, MD  leflunomide  (ARAVA ) 10 MG tablet Take 1 tablet (10 mg total) by mouth daily. 10/16/24   Rice, Lonni ORN, MD  medroxyPROGESTERone  (DEPO-PROVERA ) 150 MG/ML injection Inject 150 mg into the muscle every 3 (three) months.    [provider]  MOUNJARO  10 MG/0.5ML Pen SMARTSIG:10 Milligram(s) SUB-Q Once a Week Patient not taking: Reported on 10/16/2024 04/20/24   [provider]  MOUNJARO  12.5 MG/0.5ML Pen Inject 12.5 mg into the skin once a week. 10/11/24   [provider]  naloxone  (NARCAN ) nasal spray 4 mg/0.1 mL SMARTSIG:Both Nares 03/31/24   [provider]  norethindrone  (AYGESTIN ) 5 MG tablet Take 10 mg by mouth at bedtime. 12/11/22   [provider]  nystatin  (MYCOSTATIN ) 100000 UNIT/ML suspension Take up to every 6 hours as needed for thrush swish and swallow 10/16/24   Rice, Lonni ORN, MD  ondansetron  (ZOFRAN -ODT) 4 MG disintegrating tablet Take 1 tablet (4 mg total) by mouth every 8 (eight) hours as needed for nausea or vomiting. 09/26/24   Banister, Pamela K, MD  oxyCODONE -acetaminophen  (PERCOCET) 10-325 MG tablet Take 1 tablet by mouth 3 (three) times daily as needed. 09/07/24   [provider]  pregabalin  (LYRICA ) 75 MG capsule Take 1 capsule (75 mg total) by mouth 3 (three) times daily. 10/16/24   Jeannetta Lonni ORN, MD  SYMBICORT  160-4.5 MCG/ACT inhaler Inhale into the lungs. 10/23/24   [provider]  tirzepatide  (MOUNJARO ) 7.5 MG/0.5ML Pen Inject 7.5 mg into the  skin once a week. Patient not taking: Reported on 10/16/2024 01/31/24   [provider]  topiramate  (TOPAMAX ) 25 MG tablet Take 1 tablet (25 mg total) by mouth 2 (two) times daily. Patient not taking: Reported on 10/16/2024 11/08/23   Dohmeier, Dedra, MD  triamcinolone ointment (KENALOG) 0.1 % Apply 1 Application topically daily at 12 noon.    [provider]  Turmeric, Curcuma [place]a, (TURMERIC ROOT) POWD Take 15 mLs by mouth daily. Put in food    [provider]  Past Surgical History Past Surgical History:  Procedure Laterality Date   BREAST LUMPECTOMY Right 2024   CHOLECYSTECTOMY     DILATATION AND CURETTAGE/HYSTEROSCOPY WITH MINERVA     3 total   INCISION AND DRAINAGE ABSCESS  2021   buttocks   IR LUMBAR PUNCTURE  11/15/2023   RADIOLOGY WITH ANESTHESIA N/A 11/15/2023   Procedure: Lumbar Puncture;  Surgeon: Karalee Wilkie POUR, MD;  Location: [facility] OR;  Service: Radiology;  Laterality: N/A;   TONSILLECTOMY AND ADENOIDECTOMY     Family History Family History  Problem Relation Age of Onset   Diabetes Mother    Hypertension Mother    Migraines Mother    Diabetes Father    Hypertension Father    Sleep apnea Father     Social History Social History[1] Allergies Clindamycin/lincomycin, Gabapentin, Other, Pregabalin , Cephalexin , Gadolinium derivatives, Iodinated contrast media, Meloxicam, Prednisone , and Clindamycin  Review of Systems Review of Systems  All other systems reviewed and are negative.   Physical Exam Vital Signs  I have reviewed the triage vital signs BP (!) 131/91 (BP Location: Right Arm)   Pulse (!) 112   Temp 98.7 F (37.1 C)   Resp 20   Ht 5' 4 (1.626 m)   Wt (!) 180.1 kg   SpO2 98%   BMI 68.14 kg/m  Physical Exam Vitals and nursing note reviewed.  Constitutional:      General: She is not in acute  distress.    Appearance: She is well-developed. She is obese.  HENT:     Head: Normocephalic and atraumatic.     Mouth/Throat:     Mouth: Mucous membranes are moist.  Eyes:     Pupils: Pupils are equal, round, and reactive to light.  Cardiovascular:     Rate and Rhythm: Normal rate and regular rhythm.     Heart sounds: No murmur heard. Pulmonary:     Effort: Pulmonary effort is normal. No respiratory distress.     Breath sounds: Examination of the right-upper field reveals wheezing. Examination of the left-upper field reveals wheezing. Examination of the right-middle field reveals wheezing. Examination of the left-middle field reveals wheezing. Examination of the right-lower field reveals wheezing. Examination of the left-lower field reveals wheezing. Wheezing present.  Abdominal:     General: Abdomen is flat.     Palpations: Abdomen is soft.     Tenderness: There is no abdominal tenderness.  Musculoskeletal:        General: No tenderness.     Right lower leg: No edema.     Left lower leg: No edema.  Skin:    General: Skin is warm and dry.  Neurological:     General: No focal deficit present.     Mental Status: She is alert. Mental status is at baseline.  Psychiatric:        Mood and Affect: Mood normal.        Behavior: Behavior normal.     ED Results and Treatments Labs (all labs ordered are listed, but only abnormal results are displayed) Labs Reviewed - No data to display  Radiology DG Chest Portable 1 View Result Date: 10/29/2024 EXAM: 1 VIEW(S) XRAY OF THE CHEST 10/29/2024 10:05:00 PM COMPARISON: 06/01/2024 CLINICAL HISTORY: shob FINDINGS: LUNGS AND PLEURA: Hypoinflated but generally clear lungs. No pleural effusion. No pneumothorax. HEART AND MEDIASTINUM: No acute abnormality of the cardiac and mediastinal silhouettes. BONES AND SOFT TISSUES: No acute  osseous abnormality. IMPRESSION: 1. Hypoinflation. No acute cardiopulmonary process. Electronically signed by: Norman Gatlin MD 10/29/2024 10:10 PM EST RP Workstation: [id number]    Pertinent labs & imaging results that were available during my care of the patient were reviewed by me and considered in my medical decision making (see MDM for details).  Medications Ordered in ED Medications  predniSONE  (DELTASONE ) tablet 40 mg (has no administration in time range)  ipratropium-albuterol  (DUONEB) 0.5-2.5 (3) MG/3ML nebulizer solution 3 mL (3 mLs Nebulization Given 10/29/24 2103)  albuterol  (PROVENTIL ) (2.5 MG/3ML) 0.083% nebulizer solution (10 mg/hr Nebulization Given 10/29/24 2258)                                                                                                                                     Procedures Procedures  (including critical care time)  Medical Decision Making / ED Course   MDM:  29 year old presenting to the emergency department with shortness of breath.  On exam, patient has diffuse wheezing.  No other focal finding present.  Suspect asthma exacerbation.  Patient received breathing treatments yesterday and reports that she was feeling better however her symptoms have recurred.  In the setting of an acute asthma exacerbation suspect patient will likely need some steroid to help resolve her symptoms.  She reports that she was on steroids as recently as a few weeks ago and she does not have any true allergy.  Will give Solu-Medrol  here, check chest x-ray and basic labs.  She had negative flu and COVID testing yesterday and I do not think this needs to be repeated.  Recommended that she continue to monitor her blood sugar at home.  Clinical Course as of 10/29/24 2308  [place] Oct 29, 2024  2307 Signed out to Dr. Griselda pending re-assessment. CXR reassuring.  [WS]    Clinical Course User Index [WS] Francesca Elsie CROME, MD     Additional history  obtained:  -External records from outside source obtained and reviewed including: Chart review including previous notes, labs, imaging, consultation notes including UC note     Imaging Studies ordered: I ordered imaging studies including CXR On my interpretation imaging demonstrates no acute process I independently visualized and interpreted imaging. I agree with the radiologist interpretation   Medicines ordered and prescription drug management: Meds ordered this encounter  Medications   ipratropium-albuterol  (DUONEB) 0.5-2.5 (3) MG/3ML nebulizer solution 3 mL   DISCONTD: sodium chloride  0.9 % bolus 1,000 mL   albuterol  (PROVENTIL ) (2.5 MG/3ML) 0.083% nebulizer solution   DISCONTD: methylPREDNISolone  sodium succinate (SOLU-MEDROL ) 125 mg/2 mL injection 125 mg  IV methylprednisolone  will be converted to either a q12h or q24h frequency with the same total daily dose (TDD).  Ordered Dose: 1 to 125 mg TDD; convert to: TDD q24h.  Ordered Dose: 126 to 250 mg TDD; convert to: TDD div q12h.  Ordered Dose: >250 mg TDD; DAW.   predniSONE  (DELTASONE ) tablet 40 mg    -I have reviewed the patients home medicines and have made adjustments as needed   Social Determinants of Health:  Diagnosis or treatment significantly limited by social determinants of health: obesity   Reevaluation: After the interventions noted above, I reevaluated the patient and found that their symptoms have improved  Co morbidities that complicate the patient evaluation  Past Medical History:  Diagnosis Date   Asthma    Autoimmune disease    lupus   Blood clots in brain 2020   Class 3 obesity (HCC)    Endometriosis    Endometritis    Fibromyalgia    Near syncope 05/09/2023   Sleep apnea    Sleep related headaches 02/24/2023   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   T2DM (type 2 diabetes mellitus) (HCC)    TOA (tubo-ovarian abscess)       Dispostion: Disposition decision including need for hospitalization was  considered, and patient discharged from emergency department.    Final Clinical Impression(s) / ED Diagnoses Final diagnoses:  Acute asthma     This chart was dictated using voice recognition software.  Despite best efforts to proofread,  errors can occur which can change the documentation meaning.     [1]  Social History Tobacco Use   Smoking status: Never    Passive exposure: Past   Smokeless tobacco: Never  Vaping Use   Vaping status: Never Used  Substance Use Topics   Alcohol use: Yes    Comment: occasional   Drug use: No     Francesca Elsie CROME, MD 10/29/24 2308  "

## 2024-10-29 NOTE — ED Triage Notes (Signed)
 Patient states she thinks she is having an asthma attack. Started about 20 minutes ago. States she used her 2 inhalers prior to coming.

## 2024-10-29 NOTE — ED Provider Notes (Signed)
 Care assumed at 2300.  Patient with history of rheumatoid arthritis here with shortness of breath.  Care assumed pending nebs, labs.  Labs are unremarkable.  Patient is feeling improved after treatment in the department.  No recurrent wheezing on assessment, no respiratory distress at this time.  Feel patient is stable for discharge home with prednisone  burst.  She does have inhalers available that she may take.  Discussed outpatient follow-up as well as return precautions for worsening symptoms.   Griselda Norris, MD 10/30/24 [phone]

## 2024-10-29 NOTE — ED Provider Triage Note (Signed)
 Emergency Medicine Provider Triage Evaluation Note  Jade Lopez , a 28 y.o. female  was evaluated in triage.  Pt complains of sob. Report sob, wheezing, cough, congestion x 3 days.  Some n/v/d as well. Seen at [facility] yesterday and had negative viral panel.  Was prescibed meds but [facility]'t [facility] it.  Still sob and wheezing  Review of Systems  Positive: As above Negative: As above  Physical Exam  BP (!) 131/91 (BP Location: Right Arm)   Pulse (!) 112   Temp 98.7 F (37.1 C)   Resp 20   Ht 5' 4 (1.626 m)   Wt (!) 180.1 kg   SpO2 100%   BMI 68.14 kg/m  Gen:   Awake, no distress   Resp:  Normal effort  MSK:   Moves extremities without difficulty  Other:    Medical Decision Making  Medically screening exam initiated at 8:55 PM.  Appropriate orders placed.  Jade Lopez was informed that the remainder of the evaluation will be completed by another provider, this initial triage assessment does not replace that evaluation, and the importance of remaining in the ED until their evaluation is complete.     Jade Colon, PA-C 10/29/24 2056

## 2024-10-29 NOTE — ED Notes (Signed)
 Attempted IV starts times 2 , unsuccessfully

## 2024-10-30 LAB — BASIC METABOLIC PANEL WITH GFR
Anion gap: 11 (ref 5–15)
BUN: 10 mg/dL (ref 6–20)
CO2: 24 mmol/L (ref 22–32)
Calcium: 8.9 mg/dL (ref 8.9–10.3)
Chloride: 103 mmol/L (ref 98–111)
Creatinine, Ser: 0.79 mg/dL (ref 0.44–1.00)
GFR, Estimated: >60 mL/min
Glucose, Bld: 186 mg/dL — ABNORMAL HIGH (ref 70–99)
Potassium: 4 mmol/L (ref 3.5–5.1)
Sodium: 138 mmol/L (ref 135–145)

## 2024-10-30 MED ORDER — ONDANSETRON HCL 4 MG/2ML IJ SOLN
4.0000 mg | Freq: Once | INTRAMUSCULAR | Status: AC
  Administered 2024-10-30: 4 mg via INTRAVENOUS
  Filled 2024-10-30: qty 2

## 2024-10-30 MED ORDER — AEROCHAMBER PLUS FLO-VU MEDIUM MISC
1.0000 | Freq: Once | Status: AC
  Administered 2024-10-30: 1

## 2024-11-10 NOTE — Telephone Encounter (Signed)
 Submitted a Prior Authorization request to [facility] MEDICAID for HUMIRA via CoverMyMeds. Will update once we receive a response.  Key: TERANCE Sherry Pennant, PharmD, MPH, BCPS, CPP Clinical Pharmacist

## 2024-11-15 MED ORDER — HUMIRA (2 PEN) 40 MG/0.4ML ~~LOC~~ AJKT
40.0000 mg | AUTO-INJECTOR | SUBCUTANEOUS | 0 refills | Status: AC
  Filled 2024-11-20: qty 6.4, 224d supply, fill #0
  Filled 2024-12-11 – 2024-12-13 (×2): qty 0.8, 28d supply, fill #1

## 2024-11-15 NOTE — Telephone Encounter (Signed)
 Received notification from [facility] MEDICAID regarding a prior authorization for HUMIRA . Authorization has been APPROVED from 11/10/2024 to 11/10/2025. Approval letter sent to scan center.  Copay for 28 days supply is $4  Patient can fill through [facility]: [phone]   Authorization # [id number]  Rx sent to [facility] for onboarding with Alwin Sherry Pennant, PharmD, MPH, BCPS, CPP Clinical Pharmacist

## 2024-11-20 ENCOUNTER — Other Ambulatory Visit: Payer: Self-pay

## 2024-11-20 ENCOUNTER — Other Ambulatory Visit (HOSPITAL_COMMUNITY): Payer: Self-pay

## 2024-11-20 NOTE — Progress Notes (Signed)
 Specialty Pharmacy Initial Fill Coordination Note  Jade Lopez is a 29 y.o. female contacted today regarding initial fill of specialty medication(s) Adalimumab  (Humira  (2 Pen))   Patient requested Delivery   Delivery date: 11/23/24   Verified address: [address]   [place] [address]   Medication will be filled on: 11/22/24   Patient is aware of $4 copayment and would like it to be billed to [facility] account.

## 2024-11-21 ENCOUNTER — Other Ambulatory Visit: Payer: Self-pay

## 2024-11-22 ENCOUNTER — Other Ambulatory Visit: Payer: Self-pay

## 2024-11-22 ENCOUNTER — Other Ambulatory Visit (HOSPITAL_COMMUNITY): Payer: Self-pay

## 2024-11-23 ENCOUNTER — Other Ambulatory Visit (HOSPITAL_COMMUNITY): Payer: Self-pay

## 2024-12-11 ENCOUNTER — Other Ambulatory Visit: Payer: Self-pay

## 2024-12-13 ENCOUNTER — Other Ambulatory Visit (HOSPITAL_COMMUNITY): Payer: Self-pay

## 2024-12-13 ENCOUNTER — Other Ambulatory Visit: Payer: Self-pay

## 2024-12-13 NOTE — Progress Notes (Signed)
 Specialty Pharmacy Refill Coordination Note  Jade Lopez is a 29 y.o. female contacted today regarding refills of specialty medication(s) Adalimumab  (Humira  (2 Pen))   Patient requested Delivery   Delivery date: 12/15/24   Verified address: [address], [place] [address]   Medication will be filled on: 12/14/24

## 2024-12-14 ENCOUNTER — Other Ambulatory Visit: Payer: Self-pay

## 2025-01-15 ENCOUNTER — Ambulatory Visit: Admitting: Internal Medicine
